# Patient Record
Sex: Female | Born: 1980 | Race: Black or African American | Hispanic: No | Marital: Single | Smoking: Former smoker
Health system: Southern US, Community
[De-identification: ages and names within clinical notes are randomized; demographics above are authoritative.]

## PROBLEM LIST (undated history)

## (undated) ENCOUNTER — Ambulatory Visit

## (undated) ENCOUNTER — Emergency Department (HOSPITAL_COMMUNITY): Admission: EM | Payer: Self-pay | Source: Home / Self Care

## (undated) DIAGNOSIS — J189 Pneumonia, unspecified organism: Secondary | ICD-10-CM

## (undated) DIAGNOSIS — I10 Essential (primary) hypertension: Secondary | ICD-10-CM

## (undated) DIAGNOSIS — K219 Gastro-esophageal reflux disease without esophagitis: Secondary | ICD-10-CM

## (undated) HISTORY — PX: DILATION AND CURETTAGE OF UTERUS: SHX78

## (undated) HISTORY — PX: WISDOM TOOTH EXTRACTION: SHX21

---
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
## 1999-01-20 ENCOUNTER — Ambulatory Visit (HOSPITAL_COMMUNITY): Admission: RE | Admit: 1999-01-20 | Discharge: 1999-01-20 | Payer: Self-pay | Admitting: Obstetrics

## 1999-02-14 ENCOUNTER — Ambulatory Visit (HOSPITAL_COMMUNITY): Admission: RE | Admit: 1999-02-14 | Discharge: 1999-02-14 | Payer: Self-pay | Admitting: *Deleted

## 1999-02-16 ENCOUNTER — Inpatient Hospital Stay (HOSPITAL_COMMUNITY): Admission: AD | Admit: 1999-02-16 | Discharge: 1999-02-16 | Payer: Self-pay | Admitting: Obstetrics & Gynecology

## 1999-02-16 ENCOUNTER — Encounter: Payer: Self-pay | Admitting: Obstetrics & Gynecology

## 1999-02-19 ENCOUNTER — Inpatient Hospital Stay (HOSPITAL_COMMUNITY): Admission: AD | Admit: 1999-02-19 | Discharge: 1999-02-21 | Payer: Self-pay | Admitting: *Deleted

## 2000-04-20 ENCOUNTER — Inpatient Hospital Stay (HOSPITAL_COMMUNITY): Admission: AD | Admit: 2000-04-20 | Discharge: 2000-04-20 | Payer: Self-pay | Admitting: Obstetrics

## 2000-04-22 ENCOUNTER — Inpatient Hospital Stay (HOSPITAL_COMMUNITY): Admission: AD | Admit: 2000-04-22 | Discharge: 2000-04-22 | Payer: Self-pay | Admitting: Obstetrics

## 2000-04-29 ENCOUNTER — Inpatient Hospital Stay (HOSPITAL_COMMUNITY): Admission: AD | Admit: 2000-04-29 | Discharge: 2000-04-29 | Payer: Self-pay | Admitting: Obstetrics & Gynecology

## 2001-01-27 ENCOUNTER — Inpatient Hospital Stay (HOSPITAL_COMMUNITY): Admission: AD | Admit: 2001-01-27 | Discharge: 2001-01-27 | Payer: Self-pay | Admitting: Obstetrics & Gynecology

## 2001-03-03 ENCOUNTER — Ambulatory Visit (HOSPITAL_COMMUNITY): Admission: RE | Admit: 2001-03-03 | Discharge: 2001-03-03 | Payer: Self-pay | Admitting: *Deleted

## 2001-07-12 ENCOUNTER — Inpatient Hospital Stay (HOSPITAL_COMMUNITY): Admission: AD | Admit: 2001-07-12 | Discharge: 2001-07-12 | Payer: Self-pay | Admitting: *Deleted

## 2001-07-13 ENCOUNTER — Inpatient Hospital Stay (HOSPITAL_COMMUNITY): Admission: AD | Admit: 2001-07-13 | Discharge: 2001-07-13 | Payer: Self-pay | Admitting: Obstetrics

## 2001-07-31 ENCOUNTER — Inpatient Hospital Stay (HOSPITAL_COMMUNITY): Admission: AD | Admit: 2001-07-31 | Discharge: 2001-08-02 | Payer: Self-pay | Admitting: Obstetrics & Gynecology

## 2003-06-19 ENCOUNTER — Emergency Department (HOSPITAL_COMMUNITY): Admission: EM | Admit: 2003-06-19 | Discharge: 2003-06-20 | Payer: Self-pay | Admitting: Emergency Medicine

## 2003-06-20 ENCOUNTER — Emergency Department (HOSPITAL_COMMUNITY): Admission: EM | Admit: 2003-06-20 | Discharge: 2003-06-20 | Payer: Self-pay | Admitting: Emergency Medicine

## 2003-06-22 ENCOUNTER — Emergency Department (HOSPITAL_COMMUNITY): Admission: EM | Admit: 2003-06-22 | Discharge: 2003-06-22 | Payer: Self-pay | Admitting: Emergency Medicine

## 2003-09-18 ENCOUNTER — Emergency Department (HOSPITAL_COMMUNITY): Admission: EM | Admit: 2003-09-18 | Discharge: 2003-09-19 | Payer: Self-pay | Admitting: Emergency Medicine

## 2004-01-27 ENCOUNTER — Emergency Department (HOSPITAL_COMMUNITY): Admission: EM | Admit: 2004-01-27 | Discharge: 2004-01-27 | Payer: Self-pay | Admitting: Emergency Medicine

## 2004-01-28 ENCOUNTER — Emergency Department (HOSPITAL_COMMUNITY): Admission: EM | Admit: 2004-01-28 | Discharge: 2004-01-28 | Payer: Self-pay | Admitting: Emergency Medicine

## 2004-01-30 ENCOUNTER — Emergency Department (HOSPITAL_COMMUNITY): Admission: EM | Admit: 2004-01-30 | Discharge: 2004-01-30 | Payer: Self-pay | Admitting: Emergency Medicine

## 2004-06-10 ENCOUNTER — Inpatient Hospital Stay (HOSPITAL_COMMUNITY): Admission: AD | Admit: 2004-06-10 | Discharge: 2004-06-10 | Payer: Self-pay | Admitting: *Deleted

## 2004-08-06 ENCOUNTER — Inpatient Hospital Stay (HOSPITAL_COMMUNITY): Admission: AD | Admit: 2004-08-06 | Discharge: 2004-08-06 | Payer: Self-pay | Admitting: *Deleted

## 2004-09-05 ENCOUNTER — Ambulatory Visit (HOSPITAL_COMMUNITY): Admission: RE | Admit: 2004-09-05 | Discharge: 2004-09-05 | Payer: Self-pay | Admitting: *Deleted

## 2005-01-30 ENCOUNTER — Inpatient Hospital Stay (HOSPITAL_COMMUNITY): Admission: AD | Admit: 2005-01-30 | Discharge: 2005-01-31 | Payer: Self-pay | Admitting: *Deleted

## 2005-01-31 ENCOUNTER — Inpatient Hospital Stay (HOSPITAL_COMMUNITY): Admission: AD | Admit: 2005-01-31 | Discharge: 2005-02-02 | Payer: Self-pay | Admitting: *Deleted

## 2005-01-31 ENCOUNTER — Ambulatory Visit: Payer: Self-pay | Admitting: *Deleted

## 2005-08-07 ENCOUNTER — Emergency Department (HOSPITAL_COMMUNITY): Admission: EM | Admit: 2005-08-07 | Discharge: 2005-08-07 | Payer: Self-pay | Admitting: Emergency Medicine

## 2005-11-21 ENCOUNTER — Encounter: Payer: Self-pay | Admitting: *Deleted

## 2006-03-07 ENCOUNTER — Emergency Department (HOSPITAL_COMMUNITY): Admission: EM | Admit: 2006-03-07 | Discharge: 2006-03-07 | Payer: Self-pay | Admitting: Family Medicine

## 2006-06-15 ENCOUNTER — Emergency Department (HOSPITAL_COMMUNITY): Admission: EM | Admit: 2006-06-15 | Discharge: 2006-06-16 | Payer: Self-pay | Admitting: Emergency Medicine

## 2006-07-31 ENCOUNTER — Emergency Department (HOSPITAL_COMMUNITY): Admission: EM | Admit: 2006-07-31 | Discharge: 2006-07-31 | Payer: Self-pay | Admitting: Family Medicine

## 2006-08-16 ENCOUNTER — Emergency Department (HOSPITAL_COMMUNITY): Admission: EM | Admit: 2006-08-16 | Discharge: 2006-08-16 | Payer: Self-pay | Admitting: Family Medicine

## 2006-11-16 ENCOUNTER — Emergency Department (HOSPITAL_COMMUNITY): Admission: EM | Admit: 2006-11-16 | Discharge: 2006-11-16 | Payer: Self-pay | Admitting: Emergency Medicine

## 2006-12-19 ENCOUNTER — Emergency Department (HOSPITAL_COMMUNITY): Admission: EM | Admit: 2006-12-19 | Discharge: 2006-12-19 | Payer: Self-pay | Admitting: Family Medicine

## 2006-12-26 ENCOUNTER — Telehealth (INDEPENDENT_AMBULATORY_CARE_PROVIDER_SITE_OTHER): Payer: Self-pay | Admitting: *Deleted

## 2007-01-08 ENCOUNTER — Ambulatory Visit: Payer: Self-pay | Admitting: Family Medicine

## 2007-01-08 DIAGNOSIS — K219 Gastro-esophageal reflux disease without esophagitis: Secondary | ICD-10-CM | POA: Insufficient documentation

## 2007-01-17 ENCOUNTER — Encounter (INDEPENDENT_AMBULATORY_CARE_PROVIDER_SITE_OTHER): Payer: Self-pay | Admitting: *Deleted

## 2007-02-19 DIAGNOSIS — J45909 Unspecified asthma, uncomplicated: Secondary | ICD-10-CM | POA: Insufficient documentation

## 2007-04-04 ENCOUNTER — Ambulatory Visit: Payer: Self-pay | Admitting: Gastroenterology

## 2007-04-11 ENCOUNTER — Encounter: Payer: Self-pay | Admitting: Family Medicine

## 2007-04-11 ENCOUNTER — Encounter: Payer: Self-pay | Admitting: Gastroenterology

## 2007-04-11 ENCOUNTER — Ambulatory Visit (HOSPITAL_COMMUNITY): Admission: RE | Admit: 2007-04-11 | Discharge: 2007-04-11 | Payer: Self-pay | Admitting: Gastroenterology

## 2007-04-16 ENCOUNTER — Ambulatory Visit: Payer: Self-pay | Admitting: Gastroenterology

## 2007-04-27 ENCOUNTER — Emergency Department (HOSPITAL_COMMUNITY): Admission: EM | Admit: 2007-04-27 | Discharge: 2007-04-27 | Payer: Self-pay | Admitting: Emergency Medicine

## 2007-05-14 ENCOUNTER — Ambulatory Visit: Payer: Self-pay | Admitting: Gastroenterology

## 2007-05-19 ENCOUNTER — Ambulatory Visit (HOSPITAL_COMMUNITY): Admission: RE | Admit: 2007-05-19 | Discharge: 2007-05-19 | Payer: Self-pay | Admitting: Gastroenterology

## 2007-08-18 ENCOUNTER — Emergency Department (HOSPITAL_COMMUNITY): Admission: EM | Admit: 2007-08-18 | Discharge: 2007-08-19 | Payer: Self-pay | Admitting: Emergency Medicine

## 2007-12-09 ENCOUNTER — Ambulatory Visit: Payer: Self-pay | Admitting: Internal Medicine

## 2007-12-10 ENCOUNTER — Ambulatory Visit (HOSPITAL_COMMUNITY): Admission: RE | Admit: 2007-12-10 | Discharge: 2007-12-10 | Payer: Self-pay | Admitting: Family Medicine

## 2007-12-10 ENCOUNTER — Encounter (INDEPENDENT_AMBULATORY_CARE_PROVIDER_SITE_OTHER): Payer: Self-pay | Admitting: Nurse Practitioner

## 2007-12-10 LAB — CONVERTED CEMR LAB
ALT: 23 units/L (ref 0–35)
AST: 18 units/L (ref 0–37)
Albumin: 4.1 g/dL (ref 3.5–5.2)
Alkaline Phosphatase: 58 units/L (ref 39–117)
BUN: 5 mg/dL — ABNORMAL LOW (ref 6–23)
Basophils Absolute: 0 10*3/uL (ref 0.0–0.1)
Basophils Relative: 1 % (ref 0–1)
CO2: 24 meq/L (ref 19–32)
Calcium: 9.3 mg/dL (ref 8.4–10.5)
Chloride: 109 meq/L (ref 96–112)
Creatinine, Ser: 0.71 mg/dL (ref 0.40–1.20)
Eosinophils Absolute: 0.1 10*3/uL (ref 0.0–0.7)
Eosinophils Relative: 1 % (ref 0–5)
Glucose, Bld: 96 mg/dL (ref 70–99)
HCT: 43.4 % (ref 36.0–46.0)
Hemoglobin: 14.4 g/dL (ref 12.0–15.0)
Lymphocytes Relative: 48 % — ABNORMAL HIGH (ref 12–46)
Lymphs Abs: 2.9 10*3/uL (ref 0.7–4.0)
MCHC: 33.2 g/dL (ref 30.0–36.0)
MCV: 86.6 fL (ref 78.0–100.0)
Monocytes Absolute: 0.5 10*3/uL (ref 0.1–1.0)
Monocytes Relative: 8 % (ref 3–12)
Neutro Abs: 2.6 10*3/uL (ref 1.7–7.7)
Neutrophils Relative %: 43 % (ref 43–77)
Platelets: 259 10*3/uL (ref 150–400)
Potassium: 3.6 meq/L (ref 3.5–5.3)
RBC: 5.01 M/uL (ref 3.87–5.11)
RDW: 13.2 % (ref 11.5–15.5)
Sodium: 142 meq/L (ref 135–145)
TSH: 1.512 microintl units/mL (ref 0.350–5.50)
Total Bilirubin: 0.3 mg/dL (ref 0.3–1.2)
Total Protein: 6.5 g/dL (ref 6.0–8.3)
WBC: 6 10*3/uL (ref 4.0–10.5)

## 2008-01-13 ENCOUNTER — Encounter (INDEPENDENT_AMBULATORY_CARE_PROVIDER_SITE_OTHER): Payer: Self-pay | Admitting: Nurse Practitioner

## 2008-01-13 ENCOUNTER — Ambulatory Visit: Payer: Self-pay | Admitting: Internal Medicine

## 2008-01-13 LAB — CONVERTED CEMR LAB
Cholesterol: 179 mg/dL (ref 0–200)
HDL: 56 mg/dL (ref >39)
LDL Cholesterol: 107 mg/dL — ABNORMAL HIGH (ref 0–99)
Total CHOL/HDL Ratio: 3.2
Triglycerides: 80 mg/dL (ref <150)
VLDL: 16 mg/dL (ref 0–40)

## 2008-03-23 ENCOUNTER — Encounter (INDEPENDENT_AMBULATORY_CARE_PROVIDER_SITE_OTHER): Payer: Self-pay | Admitting: Family Medicine

## 2008-03-23 ENCOUNTER — Ambulatory Visit: Payer: Self-pay | Admitting: Internal Medicine

## 2008-03-23 LAB — CONVERTED CEMR LAB
Free Thyroxine Index: 2.3 (ref 1.0–3.9)
Helicobacter Pylori Antibody-IgG: 0.7
T3 Uptake Ratio: 29.2 % (ref 22.5–37.0)
T4, Total: 8 ug/dL (ref 5.0–12.5)
TSH: 1.577 microintl units/mL (ref 0.350–4.50)

## 2008-05-26 ENCOUNTER — Ambulatory Visit: Payer: Self-pay | Admitting: Internal Medicine

## 2008-06-25 ENCOUNTER — Emergency Department (HOSPITAL_COMMUNITY): Admission: EM | Admit: 2008-06-25 | Discharge: 2008-06-25 | Payer: Self-pay | Admitting: Family Medicine

## 2008-06-28 ENCOUNTER — Emergency Department (HOSPITAL_COMMUNITY): Admission: EM | Admit: 2008-06-28 | Discharge: 2008-06-28 | Payer: Self-pay | Admitting: Family Medicine

## 2008-10-07 ENCOUNTER — Emergency Department (HOSPITAL_COMMUNITY): Admission: EM | Admit: 2008-10-07 | Discharge: 2008-10-07 | Payer: Self-pay | Admitting: Family Medicine

## 2008-10-11 ENCOUNTER — Inpatient Hospital Stay (HOSPITAL_COMMUNITY): Admission: AD | Admit: 2008-10-11 | Discharge: 2008-10-11 | Payer: Self-pay | Admitting: Obstetrics & Gynecology

## 2008-10-11 ENCOUNTER — Encounter: Payer: Self-pay | Admitting: Family Medicine

## 2008-10-23 ENCOUNTER — Ambulatory Visit: Payer: Self-pay | Admitting: Advanced Practice Midwife

## 2008-10-23 ENCOUNTER — Inpatient Hospital Stay (HOSPITAL_COMMUNITY): Admission: AD | Admit: 2008-10-23 | Discharge: 2008-10-23 | Payer: Self-pay | Admitting: Obstetrics and Gynecology

## 2008-11-18 ENCOUNTER — Ambulatory Visit: Payer: Self-pay | Admitting: Family Medicine

## 2008-11-18 ENCOUNTER — Encounter: Payer: Self-pay | Admitting: Family Medicine

## 2008-11-19 ENCOUNTER — Encounter: Payer: Self-pay | Admitting: Family Medicine

## 2008-11-21 ENCOUNTER — Inpatient Hospital Stay (HOSPITAL_COMMUNITY): Admission: AD | Admit: 2008-11-21 | Discharge: 2008-11-21 | Payer: Self-pay | Admitting: Obstetrics & Gynecology

## 2008-12-15 ENCOUNTER — Encounter: Payer: Self-pay | Admitting: Obstetrics and Gynecology

## 2008-12-15 ENCOUNTER — Ambulatory Visit (HOSPITAL_COMMUNITY): Admission: RE | Admit: 2008-12-15 | Discharge: 2008-12-15 | Payer: Self-pay | Admitting: Obstetrics and Gynecology

## 2008-12-15 ENCOUNTER — Ambulatory Visit: Payer: Self-pay | Admitting: Obstetrics and Gynecology

## 2008-12-15 LAB — CONVERTED CEMR LAB
HCT: 36.2 % (ref 36.0–46.0)
Hemoglobin: 12 g/dL (ref 12.0–15.0)
MCHC: 33.1 g/dL (ref 30.0–36.0)
MCV: 86 fL (ref 78.0–100.0)
RBC: 4.21 M/uL (ref 3.87–5.11)
WBC: 6.6 10*3/uL (ref 4.0–10.5)

## 2008-12-16 ENCOUNTER — Ambulatory Visit: Payer: Self-pay | Admitting: Obstetrics & Gynecology

## 2008-12-17 ENCOUNTER — Encounter: Payer: Self-pay | Admitting: Obstetrics & Gynecology

## 2008-12-17 ENCOUNTER — Ambulatory Visit (HOSPITAL_COMMUNITY): Admission: RE | Admit: 2008-12-17 | Discharge: 2008-12-17 | Payer: Self-pay | Admitting: Obstetrics & Gynecology

## 2008-12-17 ENCOUNTER — Ambulatory Visit: Payer: Self-pay | Admitting: Obstetrics & Gynecology

## 2009-01-14 ENCOUNTER — Ambulatory Visit: Payer: Self-pay | Admitting: Obstetrics & Gynecology

## 2009-01-14 LAB — CONVERTED CEMR LAB

## 2009-02-10 ENCOUNTER — Ambulatory Visit: Payer: Self-pay | Admitting: Obstetrics and Gynecology

## 2009-03-02 ENCOUNTER — Ambulatory Visit: Payer: Self-pay | Admitting: Family Medicine

## 2009-03-02 ENCOUNTER — Telehealth: Payer: Self-pay | Admitting: *Deleted

## 2009-03-02 ENCOUNTER — Encounter: Payer: Self-pay | Admitting: Family Medicine

## 2009-03-02 LAB — CONVERTED CEMR LAB: hCG, Beta Chain, Quant, S: 54080.6 milliintl units/mL

## 2009-03-04 ENCOUNTER — Ambulatory Visit (HOSPITAL_COMMUNITY): Admission: RE | Admit: 2009-03-04 | Discharge: 2009-03-04 | Payer: Self-pay | Admitting: Family Medicine

## 2009-03-04 ENCOUNTER — Ambulatory Visit: Payer: Self-pay | Admitting: Family Medicine

## 2009-03-04 ENCOUNTER — Encounter: Payer: Self-pay | Admitting: Family Medicine

## 2009-03-06 ENCOUNTER — Ambulatory Visit: Payer: Self-pay | Admitting: Family

## 2009-03-06 ENCOUNTER — Inpatient Hospital Stay (HOSPITAL_COMMUNITY): Admission: AD | Admit: 2009-03-06 | Discharge: 2009-03-06 | Payer: Self-pay | Admitting: Obstetrics & Gynecology

## 2009-03-07 LAB — CONVERTED CEMR LAB
Basophils Relative: 0 % (ref 0–1)
Hemoglobin: 13.6 g/dL (ref 12.0–15.0)
Hepatitis B Surface Ag: NEGATIVE
Lymphocytes Relative: 31 % (ref 12–46)
Monocytes Absolute: 0.5 10*3/uL (ref 0.1–1.0)
Monocytes Relative: 9 % (ref 3–12)
Neutro Abs: 3.4 10*3/uL (ref 1.7–7.7)
Neutrophils Relative %: 59 % (ref 43–77)
RBC: 4.88 M/uL (ref 3.87–5.11)
Rh Type: POSITIVE
WBC: 5.7 10*3/uL (ref 4.0–10.5)

## 2009-03-15 ENCOUNTER — Telehealth: Payer: Self-pay | Admitting: *Deleted

## 2009-03-15 LAB — CONVERTED CEMR LAB
Antibody Screen: NEGATIVE
Basophils Absolute: 0 10*3/uL (ref 0.0–0.1)
Basophils Relative: 0 % (ref 0–1)
Eosinophils Absolute: 0 10*3/uL (ref 0.0–0.7)
Eosinophils Relative: 0 % (ref 0–5)
HCT: 38.3 % (ref 36.0–46.0)
Hemoglobin: 12.9 g/dL (ref 12.0–15.0)
Hepatitis B Surface Ag: NEGATIVE
Lymphocytes Relative: 25 % (ref 12–46)
Lymphs Abs: 1.7 10*3/uL (ref 0.7–4.0)
MCHC: 33.7 g/dL (ref 30.0–36.0)
MCV: 85.9 fL (ref 78.0–100.0)
Monocytes Absolute: 0.5 10*3/uL (ref 0.1–1.0)
Monocytes Relative: 8 % (ref 3–12)
Neutro Abs: 4.6 10*3/uL (ref 1.7–7.7)
Neutrophils Relative %: 67 % (ref 43–77)
Platelets: 259 10*3/uL (ref 150–400)
RBC: 4.46 M/uL (ref 3.87–5.11)
RDW: 13 % (ref 11.5–15.5)
Rh Type: POSITIVE
Rubella: 256 intl units/mL — ABNORMAL HIGH
Sickle Cell Screen: NEGATIVE
WBC: 6.8 10*3/uL (ref 4.0–10.5)

## 2009-03-18 ENCOUNTER — Encounter: Payer: Self-pay | Admitting: Family Medicine

## 2009-03-18 ENCOUNTER — Other Ambulatory Visit: Admission: RE | Admit: 2009-03-18 | Discharge: 2009-03-18 | Payer: Self-pay | Admitting: Family Medicine

## 2009-03-18 ENCOUNTER — Ambulatory Visit: Payer: Self-pay | Admitting: Family Medicine

## 2009-03-18 LAB — CONVERTED CEMR LAB

## 2009-03-21 ENCOUNTER — Inpatient Hospital Stay (HOSPITAL_COMMUNITY): Admission: AD | Admit: 2009-03-21 | Discharge: 2009-03-21 | Payer: Self-pay | Admitting: Obstetrics & Gynecology

## 2009-03-23 LAB — CONVERTED CEMR LAB: GC Probe Amp, Genital: NEGATIVE

## 2009-04-02 ENCOUNTER — Encounter (INDEPENDENT_AMBULATORY_CARE_PROVIDER_SITE_OTHER): Payer: Self-pay | Admitting: *Deleted

## 2009-04-02 DIAGNOSIS — F17200 Nicotine dependence, unspecified, uncomplicated: Secondary | ICD-10-CM

## 2009-04-18 ENCOUNTER — Ambulatory Visit: Payer: Self-pay | Admitting: Family Medicine

## 2009-05-13 ENCOUNTER — Inpatient Hospital Stay (HOSPITAL_COMMUNITY): Admission: AD | Admit: 2009-05-13 | Discharge: 2009-05-13 | Payer: Self-pay | Admitting: Obstetrics & Gynecology

## 2009-05-24 ENCOUNTER — Ambulatory Visit: Payer: Self-pay | Admitting: Family Medicine

## 2009-05-24 ENCOUNTER — Encounter: Payer: Self-pay | Admitting: Family Medicine

## 2009-05-25 ENCOUNTER — Encounter: Payer: Self-pay | Admitting: *Deleted

## 2009-05-27 ENCOUNTER — Encounter: Payer: Self-pay | Admitting: Family Medicine

## 2009-05-31 ENCOUNTER — Encounter: Payer: Self-pay | Admitting: Family Medicine

## 2009-05-31 ENCOUNTER — Ambulatory Visit (HOSPITAL_COMMUNITY): Admission: RE | Admit: 2009-05-31 | Discharge: 2009-05-31 | Payer: Self-pay | Admitting: Family Medicine

## 2009-06-24 ENCOUNTER — Ambulatory Visit: Payer: Self-pay | Admitting: Family Medicine

## 2009-06-27 ENCOUNTER — Encounter: Payer: Self-pay | Admitting: *Deleted

## 2009-06-27 ENCOUNTER — Telehealth: Payer: Self-pay | Admitting: *Deleted

## 2009-06-28 ENCOUNTER — Encounter: Payer: Self-pay | Admitting: Family Medicine

## 2009-06-28 ENCOUNTER — Ambulatory Visit (HOSPITAL_COMMUNITY): Admission: RE | Admit: 2009-06-28 | Discharge: 2009-06-28 | Payer: Self-pay | Admitting: Family Medicine

## 2009-07-01 ENCOUNTER — Telehealth: Payer: Self-pay | Admitting: Family Medicine

## 2009-07-25 ENCOUNTER — Encounter: Payer: Self-pay | Admitting: Family Medicine

## 2009-07-25 ENCOUNTER — Ambulatory Visit: Payer: Self-pay | Admitting: Family Medicine

## 2009-07-25 LAB — CONVERTED CEMR LAB
HCT: 36.3 % (ref 36.0–46.0)
MCV: 86.8 fL (ref 78.0–100.0)
Platelets: 245 10*3/uL (ref 150–400)
RBC: 4.18 M/uL (ref 3.87–5.11)
WBC: 5.5 10*3/uL (ref 4.0–10.5)

## 2009-07-28 ENCOUNTER — Ambulatory Visit: Payer: Self-pay | Admitting: Family Medicine

## 2009-08-09 ENCOUNTER — Telehealth: Payer: Self-pay | Admitting: Family Medicine

## 2009-08-15 ENCOUNTER — Telehealth: Payer: Self-pay | Admitting: Family Medicine

## 2009-08-16 ENCOUNTER — Encounter: Payer: Self-pay | Admitting: Family Medicine

## 2009-08-17 ENCOUNTER — Ambulatory Visit: Payer: Self-pay | Admitting: Family Medicine

## 2009-08-31 ENCOUNTER — Ambulatory Visit: Payer: Self-pay | Admitting: Family Medicine

## 2009-08-31 ENCOUNTER — Encounter: Payer: Self-pay | Admitting: Family Medicine

## 2009-08-31 LAB — CONVERTED CEMR LAB
ALT: 12 U/L
AST: 17 U/L
Albumin: 3 g/dL — ABNORMAL LOW
Alkaline Phosphatase: 184 U/L — ABNORMAL HIGH
BUN: 6 mg/dL
Bilirubin Urine: NEGATIVE
CO2: 24 meq/L
Calcium: 8.8 mg/dL
Chloride: 104 meq/L
Creatinine, Ser: 0.64 mg/dL
Epithelial cells, urine: >20 /LPF
Glucose, Bld: 71 mg/dL
Glucose, Urine, Semiquant: NEGATIVE
HCT: 41.4 %
Hemoglobin: 13.5 g/dL
INR: 0.91
Ketones, urine, test strip: NEGATIVE
LDH: 222 U/L
MCHC: 32.6 g/dL
MCV: 89.8 fL
Nitrite: NEGATIVE
Platelets: 260 K/uL
Potassium: 4.2 meq/L
Protein, U semiquant: 100
Prothrombin Time: 12.2 s
RBC: 4.61 M/uL
RDW: 14.4 %
Sodium: 138 meq/L
Specific Gravity, Urine: 1.02
Total Bilirubin: 0.3 mg/dL
Total Protein: 5.7 g/dL — ABNORMAL LOW
Uric Acid, Serum: 4.7 mg/dL
Urobilinogen, UA: 0.2
WBC: 5.6 10*3/microliter
aPTT: 30 s
pH: 6.5

## 2009-09-01 ENCOUNTER — Encounter: Payer: Self-pay | Admitting: Family Medicine

## 2009-09-02 ENCOUNTER — Ambulatory Visit: Payer: Self-pay | Admitting: Family Medicine

## 2009-09-02 LAB — CONVERTED CEMR LAB: Glucose, Urine, Semiquant: NEGATIVE

## 2009-09-05 ENCOUNTER — Encounter: Payer: Self-pay | Admitting: Family Medicine

## 2009-09-06 ENCOUNTER — Ambulatory Visit: Payer: Self-pay | Admitting: Obstetrics & Gynecology

## 2009-09-06 ENCOUNTER — Encounter: Payer: Self-pay | Admitting: Family Medicine

## 2009-09-06 LAB — CONVERTED CEMR LAB
Alkaline Phosphatase: 170 units/L — ABNORMAL HIGH (ref 39–117)
BUN: 5 mg/dL — ABNORMAL LOW (ref 6–23)
Glucose, Bld: 105 mg/dL — ABNORMAL HIGH (ref 70–99)
LDH: 195 units/L (ref 94–250)
MCHC: 32.8 g/dL (ref 30.0–36.0)
MCV: 87.3 fL (ref 78.0–100.0)
Platelets: 242 10*3/uL (ref 150–400)
Protein, Ur: 153 mg/24hr — ABNORMAL HIGH (ref 50–100)
Prothrombin Time: 13 s (ref 11.6–15.2)
Sodium: 141 meq/L (ref 135–145)
Total Bilirubin: 0.3 mg/dL (ref 0.3–1.2)
WBC: 5.2 10*3/uL (ref 4.0–10.5)

## 2009-09-09 ENCOUNTER — Encounter: Payer: Self-pay | Admitting: Family Medicine

## 2009-09-09 ENCOUNTER — Ambulatory Visit: Payer: Self-pay | Admitting: Family Medicine

## 2009-09-09 LAB — CONVERTED CEMR LAB
Albumin: 3 g/dL — ABNORMAL LOW (ref 3.5–5.2)
Alkaline Phosphatase: 182 units/L — ABNORMAL HIGH (ref 39–117)
BUN: 6 mg/dL (ref 6–23)
Bilirubin Urine: NEGATIVE
Glucose, Bld: 90 mg/dL (ref 70–99)
Ketones, urine, test strip: NEGATIVE
LDH: 204 units/L (ref 94–250)
MCHC: 33.1 g/dL (ref 30.0–36.0)
MCV: 87.9 fL (ref 78.0–100.0)
Platelets: 225 10*3/uL (ref 150–400)
Potassium: 4 meq/L (ref 3.5–5.3)
Prothrombin Time: 12.7 s (ref 11.6–15.2)
RBC: 4.64 M/uL (ref 3.87–5.11)
Specific Gravity, Urine: >=1.03

## 2009-09-14 ENCOUNTER — Encounter: Payer: Self-pay | Admitting: Family Medicine

## 2009-09-16 ENCOUNTER — Encounter: Payer: Self-pay | Admitting: Family Medicine

## 2009-09-16 ENCOUNTER — Ambulatory Visit: Payer: Self-pay | Admitting: Family Medicine

## 2009-09-16 LAB — CONVERTED CEMR LAB
Blood in Urine, dipstick: NEGATIVE
Glucose, Urine, Semiquant: NEGATIVE
Specific Gravity, Urine: 1.02
WBC Urine, dipstick: NEGATIVE
pH: 6.5

## 2009-09-19 LAB — CONVERTED CEMR LAB
Albumin: 3 g/dL — ABNORMAL LOW (ref 3.5–5.2)
BUN: 5 mg/dL — ABNORMAL LOW (ref 6–23)
CO2: 22 meq/L (ref 19–32)
Calcium: 8.5 mg/dL (ref 8.4–10.5)
Chloride: 106 meq/L (ref 96–112)
Glucose, Bld: 66 mg/dL — ABNORMAL LOW (ref 70–99)
HCT: 38.7 % (ref 36.0–46.0)
Hemoglobin: 12.5 g/dL (ref 12.0–15.0)
LDH: 207 units/L (ref 94–250)
MCHC: 32.3 g/dL (ref 30.0–36.0)
MCV: 87.8 fL (ref 78.0–100.0)
Potassium: 4.1 meq/L (ref 3.5–5.3)
RBC: 4.41 M/uL (ref 3.87–5.11)
Total Protein: 5.6 g/dL — ABNORMAL LOW (ref 6.0–8.3)

## 2009-09-22 ENCOUNTER — Ambulatory Visit: Payer: Self-pay | Admitting: Family Medicine

## 2009-09-22 LAB — CONVERTED CEMR LAB
Glucose, Urine, Semiquant: NEGATIVE
Nitrite: NEGATIVE
Protein, U semiquant: 30
Specific Gravity, Urine: >=1.03
WBC Urine, dipstick: NEGATIVE
pH: 6

## 2009-09-30 ENCOUNTER — Ambulatory Visit: Payer: Self-pay | Admitting: Family Medicine

## 2009-09-30 ENCOUNTER — Encounter: Payer: Self-pay | Admitting: Family Medicine

## 2009-09-30 LAB — CONVERTED CEMR LAB
Blood in Urine, dipstick: NEGATIVE
Glucose, Urine, Semiquant: NEGATIVE
Ketones, urine, test strip: NEGATIVE
Protein, U semiquant: 100
Specific Gravity, Urine: 1.025
WBC Urine, dipstick: NEGATIVE
pH: 6.5

## 2009-10-01 ENCOUNTER — Encounter: Payer: Self-pay | Admitting: Family Medicine

## 2009-10-01 LAB — CONVERTED CEMR LAB: GC Probe Amp, Genital: NEGATIVE

## 2009-10-06 ENCOUNTER — Ambulatory Visit: Payer: Self-pay | Admitting: Family Medicine

## 2009-10-06 ENCOUNTER — Inpatient Hospital Stay (HOSPITAL_COMMUNITY): Admission: AD | Admit: 2009-10-06 | Discharge: 2009-10-09 | Payer: Self-pay | Admitting: Obstetrics & Gynecology

## 2009-10-06 ENCOUNTER — Ambulatory Visit: Payer: Self-pay | Admitting: Obstetrics & Gynecology

## 2009-10-06 LAB — CONVERTED CEMR LAB
Bilirubin Urine: NEGATIVE
Blood in Urine, dipstick: NEGATIVE
Glucose, Urine, Semiquant: NEGATIVE
Ketones, urine, test strip: NEGATIVE
Protein, U semiquant: 100
Urobilinogen, UA: 1

## 2009-10-07 ENCOUNTER — Encounter: Payer: Self-pay | Admitting: Obstetrics & Gynecology

## 2009-10-11 ENCOUNTER — Telehealth: Payer: Self-pay | Admitting: Family Medicine

## 2009-10-13 ENCOUNTER — Ambulatory Visit: Payer: Self-pay | Admitting: Family Medicine

## 2009-10-17 ENCOUNTER — Telehealth: Payer: Self-pay | Admitting: Family Medicine

## 2009-11-15 ENCOUNTER — Ambulatory Visit: Payer: Self-pay | Admitting: Family Medicine

## 2010-01-13 ENCOUNTER — Ambulatory Visit: Payer: Self-pay | Admitting: Family Medicine

## 2010-03-14 ENCOUNTER — Emergency Department (HOSPITAL_COMMUNITY): Admission: EM | Admit: 2010-03-14 | Discharge: 2010-03-14 | Payer: Self-pay | Admitting: Emergency Medicine

## 2010-03-21 ENCOUNTER — Encounter: Payer: Self-pay | Admitting: Family Medicine

## 2010-04-06 ENCOUNTER — Encounter: Payer: Self-pay | Admitting: Family Medicine

## 2010-04-06 ENCOUNTER — Ambulatory Visit: Payer: Self-pay | Admitting: Family Medicine

## 2010-06-29 ENCOUNTER — Ambulatory Visit: Payer: Self-pay | Admitting: Family Medicine

## 2010-07-07 ENCOUNTER — Ambulatory Visit: Payer: Self-pay

## 2010-08-15 ENCOUNTER — Ambulatory Visit
Admission: RE | Admit: 2010-08-15 | Discharge: 2010-08-15 | Payer: Self-pay | Source: Home / Self Care | Attending: Family Medicine | Admitting: Family Medicine

## 2010-08-15 DIAGNOSIS — I8000 Phlebitis and thrombophlebitis of superficial vessels of unspecified lower extremity: Secondary | ICD-10-CM | POA: Insufficient documentation

## 2010-08-24 NOTE — Assessment & Plan Note (Signed)
Summary: ob visit/eo   Vital Signs:  Patient profile:   30 year old female Weight:      202.19 pounds BP sitting:   138 / 82  (right arm)  Vitals Entered By: Terese Door (September 30, 2009 11:41 AM)  Habits & Providers  Alcohol-Tobacco-Diet     Cigarette Packs/Day: n/a  Allergies: 1)  ! Vicodin   Impression & Recommendations:  Problem # 1:  PREGNANCY, NORMAL (ICD-V22.2) Assessment Unchanged Orders: Grp B Probe-[facility] (40347-42595) GC/Chlamydia-[facility] (87591/87491) Medicaid OB visit - [facility] ([id number])  G6 P3023P @ 35weeks 3 days EGA by LMP (w/ concordant u/s @ [redacted]w[redacted]d). Due date 10/24/09   - ***SEE BELOW REGARDING PRIOR PIH / PROTEINURIA WORKUP***  - GT/CT wnl x 2, Pap wnl  - Quad screen:  Borderline elevated for 18.1 GA on NTD screen, recommendation of diagnostic ultrasound. Diagnostic ultrasound wnl.  Screen negative Down Syndrome  Screen negative trisomy 36 - h/o 2 spontaneous abortions  - follow up in 1 week - continue PNV - 1 hr GTT normal - single episode of bleeding at  ~ 7 weeks - small subchorionic hemorrhage noted on ultrasound, w/ viable fetus. Bleeding now resolved. Will follow closely. No previa on recent ultrasound on 05/13/09.   - Check GBS, GC/CT in one week   PRENATAL LABS:  HIV: negative x 2 RPR: negative x2  Hgb: 13.6  Platelets: 276 Sickle cell screen: negative  ABO / Rh: O pos; ab negative GC / Chlamydia:  neg x 2 1 hr GTT: 92 GBS: pending at 36 weeks  Rubella: immune  HepBsAg: negative Urine Cx: insignificant growth   Borderline elevated for 18.1 GA on NTD screen, diagnostic u/s pending  Screen negative Down Syndrome Screen negative trisomy 18  Orders: Medicaid OB visit - [facility] ([id number])  Problem # 2:  PROTEINURIA, MILD (ICD-791.0) Assessment: Deteriorated  Orders: Urinalysis-[facility] (00000) Medicaid OB visit - [facility] ([id number])  Pressures deteriorated. LE edema deteriorated. 100 protein on UA. Will have patient come in for 24 hour urine on Monday. Last two  24 hr urine proteins 200, 153. PIH labs x 4 normal.  BPP/NST denied x 2 because no pre-eclampsia. Will hold off on these for now but if worsening symptoms will send to [facility] clinic at [facility]. Follow up weekly.   Complete Medication List: 1)  Zegerid 40-1100 Mg Caps (Omeprazole-sodium bicarbonate) .Marland Kitchen.. 1 by mouth once daily 2)  Prenatal Vitamins 0.8 Mg Tabs (Prenatal multivit-min-fe-fa) .... [facility] brand  Patient Instructions: 1)   Please go to the MAU if you have more than 4 contractions in an hour, if you have bleeding, a gush of fluid or if the baby isn't moving well. 2)  Check to see how the baby is moving every morning and night.  If she moves at least 5 times in an hour, she is fine.  If less than 5 times in an hour, count for  a second hour.  If less than 10 movements in those 2 hours, go to the MAU to get checked out. 3)  If you start having vision changes, severe headaches, asevere abdominal pains or other concerns, go to [facility] hospital 4)  Please see Dr. 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     Number of children at home:  3  FOB Information    Husband/Father of baby: Francesco Runner     FOB occupation Works at [employer]   Menstrual History    LMP (date): 01/17/2009    LMP - Character: normal     Flowsheet View for Follow-up Visit    Estimated weeks of       gestation:     36 4/7    Weight:     202.19    Blood pressure:   138 / 82    Urine Protein:     100    Urine Glucose:   negative    Urine Nitrite:     negative    Headache:     No    Nausea/vomiting:   No    Edema:     2+LE    Vaginal bleeding:   no    Vaginal discharge:   no    Fundal height:      35    FHR:       150    Fetal activity:     yes    Labor symptoms:   Braxton Hicks ctx increasing    Taking prenatal vits?   Y     Smoking:     n/a    Next visit:     1 wk    Resident:     Wallene Huh    Preceptor:     Breen   Laboratory Results   Urine Tests  Date/Time Received: September 30, 2009 12:35 PM  Date/Time Reported: September 30, 2009 2:45 PM   Routine Urinalysis   Color: yellow Appearance: Clear Glucose: negative   (Normal Range: Negative) Bilirubin: negative   (Normal Range: Negative) Ketone: negative   (Normal Range: Negative) Spec. Gravity: 1.025   (Normal Range: 1.003-1.035) Blood: negative   (Normal Range: Negative) pH: 6.5   (Normal Range: 5.0-8.0) Protein: 100   (Normal Range: Negative) Urobilinogen: 0.2   (Normal Range: 0-1) Nitrite: negative   (Normal Range: Negative) Leukocyte Esterace: negative   (Normal Range: Negative)  Urine Microscopic WBC/HPF: 0-5 RBC/HPF: occ Bacteria/HPF: 2+ Mucous/HPF: 2+ Epithelial/HPF: 5-15    Comments: ...............test performed by......Marland KitchenBonnie A. Swaziland, MLS (ASCP)cm

## 2010-08-24 NOTE — Progress Notes (Signed)
Summary: triage  Phone Note Call from Patient Call back at Home Phone [phone]   Caller: Patient Summary of Call: Pt has swelling in feet and hands and tingling. Initial call taken by: Clydell Hakim,  August 15, 2009 3:34 PM  Follow-up for Phone Call        30 weeks today. explained extra blood volume in body when pregnant. can get swelling & tingling from pressure on nerves. has OB appt Wed. no high bps noted. denies any other symptoms. will discuss at appt.  also needs that note for her clinicals. has to say she can lift & bend Follow-up by: Golden Circle RN,  August 15, 2009 3:41 PM  Additional Follow-up for Phone Call Additional follow up Details #1::        Letter written, will provide info on lifting technique.  Additional Follow-up by: Bobby Rumpf  MD,  August 16, 2009 9:04 AM     Appended Document: triage called and left message to pick up

## 2010-08-24 NOTE — Assessment & Plan Note (Signed)
Summary: depo,df  Nurse Visit   Allergies: 1)  ! Vicodin  Medication Administration  Injection # 1:    Medication: Depo-Provera 150mg     Diagnosis: CONTRACEPTIVE MANAGEMENT (ICD-V25.09)    Route: IM    Site: RUOQ gluteus    Exp Date: 09/2012    Lot #: [id number]    Mfr: [email or web address]    Comments: next Depo due Dec 1 thru Dec15, 2011     Patient tolerated injection without complications    Given by: Theresia Lo RN (April 06, 2010 10:21 AM)  Orders Added: 1)  Depo-Provera 150mg  [J1055] 2)  Admin of Injection (IM/SQ) [04540]   Medication Administration  Injection # 1:    Medication: Depo-Provera 150mg     Diagnosis: CONTRACEPTIVE MANAGEMENT (ICD-V25.09)    Route: IM    Site: RUOQ gluteus    Exp Date: 09/2012    Lot #: [id number]    Mfr: [email or web address]    Comments: next Depo due Dec 1 thru Dec15, 2011     Patient tolerated injection without complications    Given by: Theresia Lo RN (April 06, 2010 10:21 AM)  Orders Added: 1)  Depo-Provera 150mg  [J1055] 2)  Admin of Injection (IM/SQ) [47829]

## 2010-08-24 NOTE — Assessment & Plan Note (Signed)
Summary: std ck & HIV ck,tcb  ***Patient decided to wait on STD check and only recieve depo today...............................................Marland KitchenGaren Grams LPN January 13, 2010 9:12 AM***   Vital Signs:  Patient profile:   29 year old female Height:      67 inches Weight:      195.4 pounds BMI:     30.71 Temp:     97.9 degrees F oral Pulse rate:   73 / minute BP sitting:   131 / 86  (left arm) Cuff size:   regular  Vitals Entered By: Garen Grams LPN (January 13, 2010 9:08 AM)   Allergies: 1)  ! Vicodin   Complete Medication List: 1)  Zegerid 40-1100 Mg Caps (Omeprazole-sodium bicarbonate) .Marland Kitchen.. 1 by mouth once daily 2)  Prenatal Vitamins 0.8 Mg Tabs (Prenatal multivit-min-fe-fa) .... [facility] brand  Other Orders: Urinalysis-[facility] (00000) Depo-Provera 150mg  (J1055) Est Level 1- [facility] (81191)   Medication Administration  Injection # 1:    Medication: Depo-Provera 150mg     Diagnosis: CONTRACEPTIVE MANAGEMENT (ICD-V25.09)    Route: IM    Site: RUOQ gluteus    Exp Date: 08/23/2012    Lot #: [id number]    Mfr: Francisca December    Comments: Next Depo Due: September 9 - Septmeber 23    Patient tolerated injection without complications    Given by: Garen Grams LPN (January 13, 2010 9:09 AM)  Orders Added: 1)  Urinalysis-[facility] [00000] 2)  Depo-Provera 150mg  [J1055] 3)  Est Level 1- [facility] [62130]  Appended Document: Orders Update    Clinical Lists Changes  Orders: Added new Test order of U Preg-[facility] [phone]) - Signed

## 2010-08-24 NOTE — Progress Notes (Signed)
Summary: phn msg  Phone Note Call from Patient Call back at Home Phone [phone]   Caller: Patient Summary of Call: pt is in CMA class and needs a note to say that she can lift and bend, since she is pregnant.  Also, wants to know if she can get a TB test since she is pregnant. pls call after 2pm since she will be in class. Initial call taken by: De Nurse,  August 09, 2009 12:24 PM  Follow-up for Phone Call        no one has called her yet and needs to know something ASAP Follow-up by: De Nurse,  August 11, 2009 2:18 PM  Additional Follow-up for Phone Call Additional follow up Details #1::        left message will tell her tb test is ok. 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

## 2010-08-24 NOTE — Assessment & Plan Note (Signed)
Summary: pain under arm,df   Vital Signs:  Patient profile:   30 year old female Height:      67 inches Weight:      193.7 pounds BMI:     30.45 Temp:     98.3 degrees F oral Pulse rate:   76 / minute BP sitting:   127 / 78  (left arm) Cuff size:   regular  Vitals Entered By: Jimmy Footman, CMA (August 15, 2010 9:09 AM) CC: right side knot by breast x3 weeks Is Patient Diabetic? No   Primary Care Provider:  Bobby Rumpf  MD  CC:  right side knot by breast x3 weeks.  History of Present Illness: Patient describes discomfort and a palpable long mass under her right breast, she thinks it is getting longer.  She felt it in December and noticed that it feels longer this week.  It is not painful when she moves.  She is one year post partum.  Habits & Providers  Alcohol-Tobacco-Diet     Tobacco Status: current  Allergies: 1)  ! Vicodin  Physical Exam  General:  Well-developed,well-nourished,in no acute distress; alert,appropriate and cooperative throughout examination Chest Wall:  palpable cord, 3 inches in length, firm and tender. Breasts:  No mass, nodules, thickening, tenderness, bulging, retraction, inflamation, nipple discharge or skin changes noted.     Impression & Recommendations:  Problem # 1:  SUPERFICIAL PHLEBITIS (ICD-451.0) Odd area for superficial venous cord, treat with hot compressed and NSAIDS, reviewed wtih Dr. Swaziland , consider ultrasound if persists or worsens, recheck in 2 weeks. Orders: [facility]- Est Level  3 (16109)  Complete Medication List: 1)  Depo-provera 150 Mg/ml Susp (Medroxyprogesterone acetate) .Marland KitchenMarland KitchenMarland Kitchen 150 mg im q 3 months 2)  Diclofenac Sodium 75 Mg Tbec (Diclofenac sodium) .... One two times a day for 2 weeks  Patient Instructions: 1)  2 week follow up with Wallene Huh or Sammy Cassar 2)  This appears to be an inflammed vericose vein in your chest wall, it should not cause you any harm 3)  Plan is 2 weeks of anti-inflammatory meds and warm compresses 4)  If  it would suddenly get worse or more painful return sooner 5)  Otherwise return in 2 weeks to see Wallene Huh or Meghana Tullo Prescriptions: DICLOFENAC SODIUM 75 MG TBEC (DICLOFENAC SODIUM) one two times a day for 2 weeks  #28 x 0   Entered and Authorized by:   Luretha Murphy NP   Signed by:   Luretha Murphy NP on 08/15/2010   Method used:   Electronically to        [facility] [phone]* (retail)       [address] [place]  [address]       Ph: [phone] or [phone]       Fax: [phone]   RxID:   [id number]    Orders Added: 1)  [facility]- Est Level  3 [03474]

## 2010-08-24 NOTE — Letter (Signed)
Summary: Generic Letter  [facility]  [address]   [place], [place] [address]   Phone: [phone]  Fax: [phone]    08/16/2009  [facility] [address] [place], [place]  [address]  To whom it may concern:  Ms. Ruth [name] has been a patient of mine since 03/18/09, and I am currently managing her pregnancy. At this point in her pregnancy, Ms. Ruth [name] is able to lift and bend without issue, provided that the proper body mechanics are utilized. I will provide Ms. Ruth [name] a copy of information regarding proper lifting technique and mechanics along with this letter. Patient may continue to lift during pregnancy at her discretion. Please feel free to direct any questions regarding this issue to the address or phone number above.            Sincerely,   Bobby Rumpf  MD   Appended Document: Generic Letter left message for her to call back. does she want the letter today or pick it up at her ob visit tomorrow?

## 2010-08-24 NOTE — Assessment & Plan Note (Signed)
Summary: ob visit 27 weeks/eo   Vital Signs:  Patient profile:   30 year old female Weight:      189 pounds BP sitting:   119 / 64  Vitals Entered By: Jone Baseman CMA (July 25, 2009 9:16 AM)  Allergies: 1)  ! Vicodin   Impression & Recommendations:  Problem # 1:  PREGNANCY, NORMAL (ICD-V22.2) Assessment Unchanged  Orders: Glucose 1 hr-[facility] (82950) CBC-[facility] (16109) HIV-[facility] (60454-09811) RPR-[facility] (91478-29562) Medicaid OB visit - [facility] (99213)  G6 P3023P @ 27 weeks 0 days EGA by LMP (w/ concordant u/s @ [redacted]w[redacted]d). Due date 10/24/09   - repeat HIV, RPR, CBC now - GT/CT, Pap wnl  - Quad screen:  Borderline elevated for 18.1 GA on NTD screen, recommendation of diagnostic ultrasound. Diagnostic ultrasound wnl.  Screen negative Down Syndrome  Screen negative trisomy 67 - h/o 2 spontaneous abortions  - follow up in 3 weeks with Dr. Wallene Huh, f/u in [facility] clinic as scheduled  - continue PNV - no indication for early 1 hr GTT (not obese, no stillbirths, no 1st degree relative w/ DM, no macrosomia); 1 hr GTT today - single episode of bleeding at  ~ 7 weeks - small subchorionic hemorrhage noted on ultrasound, w/ viable fetus. Bleeding now resolved. Will follow closely. No previa on recent ultrasound on 05/13/09.  - discussed importance of smoking cessation and avoiding tobacco exposure - patient denies either though smells of cigarette smoke. will follow closely - advise at each visit.   PRENATAL LABS:  HIV: negative RPR: negative Hgb: 13.6  Platelets: 276 Sickle cell screen: negative  ABO / Rh: O pos; ab negative GC / Chlamydia:  neg / neg 1 hr GTT: pending at 28 weeks  GBS: pending at 36 weeks  Rubella: immune  HepBsAg: negative Urine Cx: insignificant growth   Borderline elevated for 18.1 GA on NTD screen, diagnostic u/s pending  Screen negative Down Syndrome Screen negative trisomy 18  Complete Medication List: 1)  Zegerid 40-1100 Mg Caps (Omeprazole-sodium bicarbonate) .Marland Kitchen..  1 by mouth once daily 2)  Prenatal Vitamins 0.8 Mg Tabs (Prenatal multivit-min-fe-fa) .... [facility] brand  Patient Instructions: 1)  It was great to meet you today! 2)  I look forward to taking care of you and your baby! 3)  You are at 27 weeks and 0 days today. 4)  Your due date is 10/24/09.  5)  Follow up with me in 3 weeks 6)  Eat 4-5 smaller meals each day to help with feeling full and with nausea.  7)  Healthy weight gain would be 25-30 lbs over the course of your pregnancy. Exercise is safe during pregnancy - light walking should be fine for you to do. We will continue to follow your weight. 8)  If you have any bleeding or contractions go to [facility].     [facility] for Follow-up Visit    Estimated weeks of       gestation:     27 0/7    Weight:     189    Blood pressure:   119 / 64    Nausea/vomiting:   No    Edema:     0    Vaginal bleeding:   no    Vaginal discharge:   no    Fetal activity:     yes    Labor symptoms:   no   Appended Document: ob visit 27 weeks/eo Fundal height = 28 cm FHT 140-150

## 2010-08-24 NOTE — Assessment & Plan Note (Signed)
Summary: DEPO & flu shot/[facility]  Nurse Visit   Allergies: 1)  ! Vicodin  Immunizations Administered:  Influenza Vaccine # 1:    Vaccine Type: Fluvax 3+    Site: right deltoid    Mfr: GlaxoSmithKline    Dose: 0.5 ml    Route: IM    Given by: Theresia Lo RN    Exp. Date: 01/20/2011    Lot #: [id number]    VIS given: 02/14/10 version given June 29, 2010.  Medication Administration  Injection # 1:    Medication: Depo-Provera 150mg     Diagnosis: CONTRACEPTIVE MANAGEMENT (ICD-V25.09)    Route: IM    Site: RUOQ gluteus    Exp Date: 11/2012    Lot #: [id number]    Mfr: [email or web address]    Comments: next depo due Feb 23 thru September 29, 2010    Patient tolerated injection without complications    Given by: Theresia Lo RN (June 29, 2010 1:52 PM)  Orders Added: 1)  Depo-Provera 150mg  [J1055] 2)  Flu Vaccine 49yrs + [90658] 3)  Admin 1st Vaccine [90471] 4)  Admin of Injection (IM/SQ) [14782]   Medication Administration  Injection # 1:    Medication: Depo-Provera 150mg     Diagnosis: CONTRACEPTIVE MANAGEMENT (ICD-V25.09)    Route: IM    Site: RUOQ gluteus    Exp Date: 11/2012    Lot #: [id number]    Mfr: [email or web address]    Comments: next depo due Feb 23 thru September 29, 2010    Patient tolerated injection without complications    Given by: Theresia Lo RN (June 29, 2010 1:52 PM)  Orders Added: 1)  Depo-Provera 150mg  [J1055] 2)  Flu Vaccine 52yrs + [90658] 3)  Admin 1st Vaccine [90471] 4)  Admin of Injection (IM/SQ) [08657]

## 2010-08-24 NOTE — Assessment & Plan Note (Signed)
Summary: ob visit/eo   Vital Signs:  Patient profile:   30 year old female Weight:      188.3 pounds Temp:     98.2 degrees F oral Pulse rate:   79 / minute BP sitting:   125 / 78  (left arm) Cuff size:   regular  Vitals Entered By: Gladstone Pih (July 28, 2009 8:46 AM) CC: OB 27 3 Is Patient Diabetic? No Pain Assessment Patient in pain? no        CC:  OB 27 3.  Habits & Providers  Alcohol-Tobacco-Diet     Cigarette Packs/Day: n/a  Allergies: 1)  ! Vicodin   Impression & Recommendations:  Problem # 1:  PREGNANCY, NORMAL (ICD-V22.2) Doing well.  28 week labs already done.  Flu shot today. Reviewed PTL precautions and kick counts.  Pt considering Depo for contraception.  Has done well in the past with this.  Complete Medication List: 1)  Zegerid 40-1100 Mg Caps (Omeprazole-sodium bicarbonate) .Marland Kitchen.. 1 by mouth once daily 2)  Prenatal Vitamins 0.8 Mg Tabs (Prenatal multivit-min-fe-fa) .... [facility] brand  Other Orders: Influenza Vaccine NON MCR (96045)  Patient Instructions: 1)  Please go to the MAU if you have more than 4 contractions in an hour, if you have bleeding, a gush of fluid or if the baby isn't moving well. 2)  Check to see how the baby is moving every morning and night.  If she moves at least 5 times in an hour, she is fine.  If less than 5 times in an hour, count for  a second hour.  If less than 10 movements in those 2 hours, go to the MAU to get checked out. 3)  Please see Dr. Wallene Huh in 2 weeks.    Flowsheet View for Follow-up Visit    Estimated weeks of       gestation:     27 3/7    Weight:     188.3    Blood pressure:   125 / 78    Headache:     No    Nausea/vomiting:   No    Edema:     0    Vaginal bleeding:   no    Vaginal discharge:   no    Fundal height:      25    FHR:       150s    Fetal activity:     yes    Labor symptoms:   no    Fetal position:     ??    Taking prenatal vits?   Y    Smoking:     n/a    Next visit:     2  wk     Immunizations Administered:  Influenza Vaccine # 1:    Vaccine Type: Fluvax Non-MCR    Site: right deltoid    Mfr: GlaxoSmithKline    Dose: 0.5 ml    Route: IM    Given by: Loralee Pacas CMA    Exp. Date: 01/19/2010    Lot #: [id number]    VIS given: 03/01/2009  Flu Vaccine Consent Questions:    Do you have a history of severe allergic reactions to this vaccine? no    Any prior history of allergic reactions to egg and/or gelatin? no    Do you have a sensitivity to the preservative Thimersol? no    Do you have a past history of Guillan-Barre Syndrome? no    Do you  currently have an acute febrile illness? no    Have you ever had a severe reaction to latex? no    Vaccine information given and explained to patient? yes    Are you currently pregnant? no   Appended Document: ob visit/eo    Clinical Lists Changes  Orders: Added new Test order of Medicaid OB visit - [facility] [phone]) - Signed

## 2010-08-24 NOTE — Assessment & Plan Note (Signed)
Summary: FU/KH   Vital Signs:  Patient profile:   30 year old female Weight:      198 pounds BP sitting:   129 / 87  Serial Vital Signs/Assessments:  Comments: 9:42 AM Manual BP: 128/88 By: Garen Grams LPN    Habits & Providers  Alcohol-Tobacco-Diet     Cigarette Packs/Day: n/a  Allergies: 1)  ! Vicodin   Impression & Recommendations:  Problem # 1:  PREGNANCY, NORMAL (ICD-V22.2) Assessment Unchanged  G6 P3023P @ 33weeks 4 days EGA by LMP (w/ concordant u/s @ [redacted]w[redacted]d). Due date 10/24/09   - ***SEE BELOW REGARDING PIH WORKUP***  - GT/CT, Pap wnl  - Quad screen:  Borderline elevated for 18.1 GA on NTD screen, recommendation of diagnostic ultrasound. Diagnostic ultrasound wnl.  Screen negative Down Syndrome  Screen negative trisomy 67 - h/o 2 spontaneous abortions  - follow up in 1 week - continue PNV - 1 hr GTT normal - single episode of bleeding at  ~ 7 weeks - small subchorionic hemorrhage noted on ultrasound, w/ viable fetus. Bleeding now resolved. Will follow closely. No previa on recent ultrasound on 05/13/09.     PRENATAL LABS:  HIV: negative x 2 RPR: negative x2  Hgb: 13.6  Platelets: 276 Sickle cell screen: negative  ABO / Rh: O pos; ab negative GC / Chlamydia:  neg / neg 1 hr GTT: 92 GBS: pending at 36 weeks  Rubella: immune  HepBsAg: negative Urine Cx: insignificant growth   Borderline elevated for 18.1 GA on NTD screen, diagnostic u/s pending  Screen negative Down Syndrome Screen negative trisomy 18  Orders: Medicaid OB visit - [facility] ([id number])  Problem # 2:  ? of PRE-ECLAMPSIA (ICD-642.40) Assessment: Unchanged Pressures remain improved. Will check PIH panel as below. 30 protein on non clean catch UA. Will hold off on 24 urine protein. LAst two 24 hr urine proteins 200, 153. PIH labs x 2 normal. Will check PIH labs, BPP/NST weekly. Follow up weekly.   Check PIH labs. Follow up in two days.  Orders: CBC-[facility] ([id number]) INR/PT-[facility] (21308) PTT-[facility]  ([id number]) Uric Acid-[facility] ([id number]) LDH-[facility] (10272) Comp Met-[facility] ([id number]) Miscellaneous Lab Charge-[facility] (42595)  Complete Medication List: 1)  Zegerid 40-1100 Mg Caps (Omeprazole-sodium bicarbonate) .Marland Kitchen.. 1 by mouth once daily 2)  Prenatal Vitamins 0.8 Mg Tabs (Prenatal multivit-min-fe-fa) .... [employer] brand  Other Orders: Urinalysis-[facility] (00000)  Patient Instructions: 1)   Please go to the MAU if you have more than 4 contractions in an hour, if you have bleeding, a gush of fluid or if the baby isn't moving well. 2)  Check to see how the baby is moving every morning and night.  If she moves at least 5 times in an hour, she is fine.  If less than 5 times in an hour, count for  a second hour.  If less than 10 movements in those 2 hours, go to the MAU to get checked out. 3)  If you start having vision changes, severe headaches, asevere abdominal pains or other concerns, go to [facility] hospital 4)  Please see Dr. 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     Number of children at home: 3  FOB Information    Husband/Father of baby: Francesco Runner     FOB occupation  Works at [employer]   Menstrual History    LMP (date): 01/17/2009    LMP - Character: normal   Flowsheet View for Follow-up Visit    Estimated weeks of       gestation:     33 4/7    Weight:     198    Blood pressure:   129 / 87    Urine Protein:     30    Urine Glucose:   negative    Urine Nitrite:     negative    Headache:     No; no visual changes    Nausea/vomiting:   No    Edema:     same as last visit    Vaginal bleeding:   no    Vaginal discharge:   no    Fundal height:      32.5    FHR:       148    Fetal activity:     yes    Labor symptoms:   no    Taking prenatal vits?   Y    Smoking:     n/a    Next visit:     1 wk    Resident:      Wallene Huh      Laboratory Results   Urine Tests  Date/Time Received: September 09, 2009 10:15 AM  Date/Time Reported: September 09, 2009 10:26 AM   Routine Urinalysis   Color: yellow Appearance: Clear Glucose: negative   (Normal Range: Negative) Bilirubin: negative   (Normal Range: Negative) Ketone: negative   (Normal Range: Negative) Spec. Gravity: >=1.030   (Normal Range: 1.003-1.035) Blood: negative   (Normal Range: Negative) pH: 6.5   (Normal Range: 5.0-8.0) Protein: 30   (Normal Range: Negative) Urobilinogen: 0.2   (Normal Range: 0-1) Nitrite: negative   (Normal Range: Negative) Leukocyte Esterace: negative   (Normal Range: Negative)  Urine Microscopic WBC/HPF: 0-2 Bacteria/HPF: 1+ Mucous/HPF: 2+ Epithelial/HPF: 10-20    Comments: ...............test performed by......Marland KitchenBonnie A. [place], MLS (ASCP)cm      Appended Document: FU/KH precepted with Dr. Mauricio Po - agrees with plan

## 2010-08-24 NOTE — Progress Notes (Signed)
Summary: FYI  Phone Note Other Incoming   Caller: Birdie Riddle Love Nurse Summary of Call: Pt. BP reading on Friday 134/86 and on Monday was 122/84  ***Pts baby weighed in at 4lbs 6oz on Monday 10/17/09, baby was only 3lbs 12oz at birth*** Initial call taken by: Garen Grams LPN,  October 17, 2009 2:10 PM

## 2010-08-24 NOTE — Assessment & Plan Note (Signed)
Summary: ob visit/eo   Vital Signs:  Patient profile:   30 year old female Weight:      198.4 pounds Pulse rate:   76 / minute BP sitting:   133 / 85  Vitals Entered By: Garen Grams LPN (September 16, 2009 8:39 AM)  Habits & Providers  Alcohol-Tobacco-Diet     Cigarette Packs/Day: n/a  Allergies: 1)  ! Vicodin   Impression & Recommendations:  Problem # 1:  PREGNANCY, NORMAL (ICD-V22.2) Assessment Unchanged  G6 P3023P @ 34weeks 4 days EGA by LMP (w/ concordant u/s @ [redacted]w[redacted]d). Due date 10/24/09   - ***SEE BELOW REGARDING PIH WORKUP***  - GT/CT, Pap wnl  - Quad screen:  Borderline elevated for 18.1 GA on NTD screen, recommendation of diagnostic ultrasound. Diagnostic ultrasound wnl.  Screen negative Down Syndrome  Screen negative trisomy 73 - h/o 2 spontaneous abortions  - follow up in 1 week - continue PNV - 1 hr GTT normal - single episode of bleeding at  ~ 7 weeks - small subchorionic hemorrhage noted on ultrasound, w/ viable fetus. Bleeding now resolved. Will follow closely. No previa on recent ultrasound on 05/13/09.     PRENATAL LABS:  HIV: negative x 2 RPR: negative x2  Hgb: 13.6  Platelets: 276 Sickle cell screen: negative  ABO / Rh: O pos; ab negative GC / Chlamydia:  neg / neg 1 hr GTT: 92 GBS: pending at 36 weeks  Rubella: immune  HepBsAg: negative Urine Cx: insignificant growth   Borderline elevated for 18.1 GA on NTD screen, diagnostic u/s pending  Screen negative Down Syndrome Screen negative trisomy 18  Orders: Medicaid OB visit - [facility] ([id number])  Problem # 2:  ? of PRE-ECLAMPSIA (ICD-642.40) Assessment: Unchanged Pressures as above. Will check PIH panel as below. 30 protein on non clean catch UA. Will hold off on 24 urine protein. Last two 24 hr urine proteins 200, 153. PIH labs x 3 normal. Will check PIH labs. BPP/NST denied x 2 because no pre-eclampsia. Will hold off on these for now but if pressures increase, if proteinuria increases, or worsening  symptoms will send to HR clinic at [facility]. Follow up weekly.  Orders: Comp Met-[facility] [id number]) Urinalysis-[facility] (00000) CBC-[facility] (57846) LDH-[facility] [id number]) Uric Acid-[facility] ([id number]) INR/PT-[facility] (02725) PTT-[facility] (36644-03474) Miscellaneous Lab Charge-[facility] (25956) Medicaid OB visit - [facility] ([id number])    Check PIH labs. Follow up in two days.  Orders: CBC-[facility] (43329) INR/PT-[facility] (51884) PTT-[facility] ([id number]) Uric Acid-[facility] ([id number]) LDH-[facility] (22025) Comp Met-[facility] ([id number]) Miscellaneous Lab Charge-[facility] (83151)  Complete Medication List: 1)  Zegerid 40-1100 Mg Caps (Omeprazole-sodium bicarbonate) .Marland Kitchen.. 1 by mouth once daily 2)  Prenatal Vitamins 0.8 Mg Tabs (Prenatal multivit-min-fe-fa) .... [employer] brand  Laboratory Results   Urine Tests  Date/Time Received: September 16, 2009 8:58 AM  Date/Time Reported: September 16, 2009 9:20 AM   Routine Urinalysis   Color: yellow Appearance: Clear Glucose: negative   (Normal Range: Negative) Bilirubin: negative   (Normal Range: Negative) Ketone: negative   (Normal Range: Negative) Spec. Gravity: 1.020   (Normal Range: 1.003-1.035) Blood: negative   (Normal Range: Negative) pH: 6.5   (Normal Range: 5.0-8.0) Protein: 30   (Normal Range: Negative) Urobilinogen: 0.2   (Normal Range: 0-1) Nitrite: negative   (Normal Range: Negative) Leukocyte Esterace: negative   (Normal Range: Negative)  Urine Microscopic WBC/HPF: rare RBC/HPF: rare Bacteria/HPF: 2+ Mucous/HPF: 2+ Epithelial/HPF: >20    Comments: ...............test performed by......Marland KitchenBonnie A. 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     Number of children at home: 3  FOB Information    Husband/Father of baby: Francesco Runner     FOB occupation Works at [employer]   Menstrual History     LMP (date): 01/17/2009    LMP - Character: normal   Flowsheet View for Follow-up Visit    Estimated weeks of       gestation:     34 4/7    Weight:     198.4    Blood pressure:   133 / 85    Urine Protein:     30    Urine Glucose:   negative    Urine Nitrite:     negative    Headache:     No, visual changes     Nausea/vomiting:   No    Edema:     slight improvement     Vaginal bleeding:   no    Vaginal discharge:   no    Fundal height:      33    FHR:       146-156    Fetal activity:     yes    Labor symptoms:   no    Taking prenatal vits?   Y    Smoking:     n/a    Next visit:     1 wk    Resident:     Wallene Huh     Preceptor:     Mauricio Po

## 2010-08-24 NOTE — Assessment & Plan Note (Signed)
Summary: htn,swelling/[place]/carew   Vital Signs:  Patient profile:   30 year old female Weight:      193.1 pounds Temp:     98.3 degrees F oral Pulse rate:   76 / minute Pulse rhythm:   regular BP sitting:   134 / 86  (left arm) Cuff size:   regular  Vitals Entered By: Loralee Pacas CMA (October 13, 2009 8:43 AM)  Primary Care Provider:  Bobby Rumpf  MD   History of Present Illness: 30 yo F, post partum, pre-eclampsia, with concern for HTN and swelling.  BPs normal, swelling improving, some tingling in fingers but improving.  No HA, no N/V, no visual changes.  No other complaints.  Current Medications (verified): 1)  Zegerid 40-1100 Mg  Caps (Omeprazole-Sodium Bicarbonate) .Marland Kitchen.. 1 By Mouth Once Daily 2)  Prenatal Vitamins 0.8 Mg Tabs (Prenatal Multivit-Min-Fe-Fa) .... [employer] Brand  Allergies (verified): 1)  ! Vicodin  Review of Systems       See HPI  Physical Exam  General:  Well-developed,well-nourished,in no acute distress; alert,appropriate and cooperative throughout examination Lungs:  Normal respiratory effort, chest expands symmetrically. Lungs are clear to auscultation, no crackles or wheezes. Heart:  Normal rate and regular rhythm. S1 and S2 normal without gallop, murmur, click, rub or other extra sounds. Abdomen:  Bowel sounds positive,abdomen soft and non-tender without masses, organomegaly or hernias noted. Extremities:  1+ pitting edema in B/L LE, neg homans sign, pulses normal bilaterally.   Impression & Recommendations:  Problem # 1:  EDEMA (ICD-782.3) Assessment Improved Still diuresing from hospitalization, BPs ok, no antihypertensives needed.  No pre-eclamptic symptoms.  Should see PCP at 6wk PP check.  Orders: Urinalysis-[facility] (00000) [facility]- Est Level  3 (27253)  Complete Medication List: 1)  Zegerid 40-1100 Mg Caps (Omeprazole-sodium bicarbonate) .Marland Kitchen.. 1 by mouth once daily 2)  Prenatal Vitamins 0.8 Mg Tabs (Prenatal multivit-min-fe-fa) .... [facility]  brand  Patient Instructions: 1)  Great to meet you today, 2)  Your swelling will go down and your blood pressures are normal.  You can try some TED hose/compression stockings when up during the day. 3)  Congratulations on your new baby, I know you can't wait until she is out of the NICU! 4)  -Dr. Karie Schwalbe.  Appended Document: UA results  Laboratory Results   Urine Tests  Date/Time Received: October 13, 2009 8:49 AM  Date/Time Reported: October 13, 2009 9:33 AM   Routine Urinalysis   Color: yellow Appearance: Clear Glucose: negative   (Normal Range: Negative) Bilirubin: negative   (Normal Range: Negative) Ketone: negative   (Normal Range: Negative) Spec. Gravity: 1.025   (Normal Range: 1.003-1.035) Blood: large   (Normal Range: Negative) pH: 6.0   (Normal Range: 5.0-8.0) Protein: 100   (Normal Range: Negative) Urobilinogen: 0.2   (Normal Range: 0-1) Nitrite: negative   (Normal Range: Negative) Leukocyte Esterace: negative   (Normal Range: Negative)  Urine Microscopic WBC/HPF: 10-15 RBC/HPF: 0-3 Bacteria/HPF: 2+ Mucous/HPF: 2+ Epithelial/HPF: 5-10    Comments: ...........test performed by...........Marland KitchenTerese Door, CMA

## 2010-08-24 NOTE — Consult Note (Signed)
Summary: Stress Test  Stress Test   Imported By: Clydell Hakim 09/14/2009 16:04:56  _____________________________________________________________________  External Attachment:    Type:   Image     Comment:   External Document

## 2010-08-24 NOTE — Assessment & Plan Note (Signed)
Summary: OB/KH   Vital Signs:  Patient profile:   30 year old female Weight:      191 pounds BP sitting:   127 / 86  Vitals Entered By: Jone Baseman CMA (August 17, 2009 4:10 PM)  Habits & Providers  Alcohol-Tobacco-Diet     Cigarette Packs/Day: n/a  Allergies: 1)  ! Vicodin   Impression & Recommendations:  Problem # 1:  PREGNANCY, NORMAL (ICD-V22.2) Assessment Unchanged  G6 P3023P @ 30 weeks 2 days EGA by LMP (w/ concordant u/s @ [redacted]w[redacted]d). Due date 10/24/09   - GT/CT, Pap wnl  - Quad screen:  Borderline elevated for 18.1 GA on NTD screen, recommendation of diagnostic ultrasound. Diagnostic ultrasound wnl.  Screen negative Down Syndrome  Screen negative trisomy 68 - h/o 2 spontaneous abortions  - follow up in 2 weeks with Dr. Wallene Huh, - continue PNV - 1 hr GTT normal - single episode of bleeding at  ~ 7 weeks - small subchorionic hemorrhage noted on ultrasound, w/ viable fetus. Bleeding now resolved. Will follow closely. No previa on recent ultrasound on 05/13/09.  - does not smell of cigarette smoke today   PRENATAL LABS:  HIV: negative x 2 RPR: negative x2  Hgb: 13.6  Platelets: 276 Sickle cell screen: negative  ABO / Rh: O pos; ab negative GC / Chlamydia:  neg / neg 1 hr GTT: 92 GBS: pending at 36 weeks  Rubella: immune  HepBsAg: negative Urine Cx: insignificant growth   Borderline elevated for 18.1 GA on NTD screen, diagnostic u/s pending  Screen negative Down Syndrome Screen negative trisomy 18  Orders: Medicaid OB visit - [facility] ([id number])  Complete Medication List: 1)  Zegerid 40-1100 Mg Caps (Omeprazole-sodium bicarbonate) .Marland Kitchen.. 1 by mouth once daily 2)  Prenatal Vitamins 0.8 Mg Tabs (Prenatal multivit-min-fe-fa) .... [employer] brand  Patient Instructions: 1)  Please go to the MAU if you have more than 4 contractions in an hour, if you have bleeding, a gush of fluid or if the baby isn't moving well. 2)  Check to see how the baby is moving every morning and  night.  If she moves at least 5 times in an hour, she is fine.  If less than 5 times in an hour, count for  a second hour.  If less than 10 movements in those 2 hours, go to the MAU to get checked out. 3)  Please see Dr. Wallene Huh in 2 weeks.   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     Number of children at home: 3  FOB Information    Husband/Father of baby: Francesco Runner     FOB occupation Works at [employer]   Menstrual History    LMP (date): 01/17/2009    LMP - Character: normal   Flowsheet View for Follow-up Visit    Estimated weeks of       gestation:     30 2/7    Weight:     191    Blood pressure:   127 / 86    Headache:     No    Nausea/vomiting:   No    Edema:     some  swelling in hands and feet at night, no vision change or HA or abdominal pain    Vaginal bleeding:  no    Vaginal discharge:   no    Fundal height:      29    FHR:       155    Fetal activity:     yes    Labor symptoms:   no    Taking prenatal vits?   Y    Smoking:     n/a    Next visit:     2 wks    Resident:     Wallene Huh    Preceptor:     sent to [place]

## 2010-08-24 NOTE — Assessment & Plan Note (Signed)
Summary: ob/kh 37.3 weeks    Vital Signs:  Patient profile:   30 year old female Weight:      198 pounds BP sitting:   143 / 95  Vitals Entered By: Jone Baseman CMA (October 06, 2009 8:42 AM)  Habits & Providers  Alcohol-Tobacco-Diet     Cigarette Packs/Day: n/a  Allergies: 1)  ! Vicodin   Impression & Recommendations:  Problem # 1:  PREGNANCY, NORMAL (ICD-V22.2)  G6 P3023P @ 37weeks 3 days EGA by LMP (w/ concordant u/s @ [redacted]w[redacted]d). Due date 10/24/09   - ***SEE BELOW REGARDING PRIOR PIH / PROTEINURIA WORKUP***  - GT/CT wnl x 2, Pap wnl  - Quad screen:  Borderline elevated for 18.1 GA on NTD screen, recommendation of diagnostic ultrasound. Diagnostic ultrasound wnl.  Screen negative Down Syndrome  Screen negative trisomy 38 - h/o 2 spontaneous abortions  - follow up in 1 week - continue PNV - 1 hr GTT normal - single episode of bleeding at  ~ 7 weeks - small subchorionic hemorrhage noted on ultrasound, w/ viable fetus. Bleeding now resolved. Will follow closely. No previa on recent ultrasound on 05/13/09.    PRENATAL LABS:  HIV: negative x 2 RPR: negative x2  Hgb: 13.6  Platelets: 276 Sickle cell screen: negative  ABO / Rh: O pos; ab negative GC / Chlamydia:  neg x 2 1 hr GTT: 92 GBS: negative  Rubella: immune  HepBsAg: negative Urine Cx: insignificant growth   Borderline elevated for 18.1 GA on NTD screen, diagnostic u/s pending  Screen negative Down Syndrome Screen negative trisomy 18 Breast and Bottle Depo --> Depo   Orders: Medicaid OB visit - [facility] ([id number])  Orders: Medicaid OB visit - [facility] ([id number])  Problem # 2:  PROTEINURIA, MILD (ICD-791.0) Pressures deteriorated. LE edema deteriorated. 100 protein on UA. Headaches with visual changes (lights in vision) over past two days. BPP/NST denied by Medicaid x 2 because no pre-eclampsia. Given change in symptoms, term pregnancy, deteriorated BP, proteinuria, today will send to MAU for PIH work up.   Orders: Urinalysis-[facility] (00000)  Complete Medication List: 1)  Zegerid 40-1100 Mg Caps (Omeprazole-sodium bicarbonate) .Marland Kitchen.. 1 by mouth once daily 2)  Prenatal Vitamins 0.8 Mg Tabs (Prenatal multivit-min-fe-fa) .... [facility] brand   Flowsheet View for Follow-up Visit    Estimated weeks of       gestation:     37 3/7    Weight:     198    Blood pressure:   143 / 95    Urine protein:       100    Urine glucose:    negative    Urine nitrite:     negative    Hx headache?     headache x 2 days w/ lights in vision, nausea - mostly relieved by Tylenol    Nausea/vomiting?   nausea    Edema?     2+ LE    Bleeding?     no    Leakage/discharge?   no    Fetal activity:       yes    Labor symptoms?   ffew braxton hicks    Taking Vitamins?   Y    Smoking PPD:   n/a    Next visit:     1 wk    Resident:     Wallene Huh    Laboratory Results   Urine Tests  Date/Time Received: October 06, 2009 8:45 AM  Date/Time Reported: October 06, 2009 9:09  AM   Routine Urinalysis   Color: yellow Appearance: Clear Glucose: negative   (Normal Range: Negative) Bilirubin: negative   (Normal Range: Negative) Ketone: negative   (Normal Range: Negative) Spec. Gravity: 1.025   (Normal Range: 1.003-1.035) Blood: negative   (Normal Range: Negative) pH: 6.5   (Normal Range: 5.0-8.0) Protein: 100   (Normal Range: Negative) Urobilinogen: 1.0   (Normal Range: 0-1) Nitrite: negative   (Normal Range: Negative) Leukocyte Esterace: negative   (Normal Range: Negative)  Urine Microscopic WBC/HPF: 1-5 RBC/HPF: occ Bacteria/HPF: 2+ Mucous/HPF: 3+ Epithelial/HPF: 10-20    Comments: occ sperm present...........test performed by...........Marland KitchenTerese Door, CMA

## 2010-08-24 NOTE — Assessment & Plan Note (Signed)
Summary: postpartum,tcb   Vital Signs:  Patient profile:   30 year old female Height:      67 inches Weight:      189.1 pounds BMI:     29.72 Temp:     98.6 degrees F oral Pulse rate:   70 / minute BP sitting:   112 / 71  (left arm) Cuff size:   regular  Vitals Entered By: Garen Grams LPN (November 15, 2009 10:31 AM) CC: postpartum check Is Patient Diabetic? No Pain Assessment Patient in pain? no        Primary Care Provider:  Bobby Rumpf  MD  CC:  postpartum check.  History of Present Illness: 1) Postpartum follow up: Admitted for severe pre-eclampsia by fetal size criteria (see dictated d/c summary), progressed to VAVD. (Myone Clark) . Denies pain, reports mild spotting x past two days, denies depressive symptoms, reports that everyone is adjusting well at home. No longer breast feeding, but has frozen breast milk left over which she is mixing with formula per NICU instructions. On Depo Provera (last given 10/09/09), would like to continue. Has not had LE edema since being seen by Dr. Benjamin Stain on 3/24. Has not had other PIH symptoms. Pressures normalized w/o antihypertensives.   2) Prevention: Would like tetatnus booster. Last booster was 10 years ago.   Current Medications (verified): 1)  Zegerid 40-1100 Mg  Caps (Omeprazole-Sodium Bicarbonate) .Marland Kitchen.. 1 By Mouth Once Daily 2)  Prenatal Vitamins 0.8 Mg Tabs (Prenatal Multivit-Min-Fe-Fa) .... [employer]vs Brand  Allergies (verified): 1)  ! Vicodin  Physical Exam  General:  NAD, vitals reviewed. Normotensive.  Lungs:  Normal respiratory effort, chest expands symmetrically. Lungs are clear to auscultation, no crackles or wheezes. Heart:  Normal rate and regular rhythm. S1 and S2 normal without gallop, murmur, click, rub or other extra sounds.   Impression & Recommendations:  Problem # 1:  POSTPARTUM EXAMINATION, NORMAL (ICD-V24.2) Assessment Comment Only  Adjusting well. No issues. Depo for contraception, next dose June 5th -  19th. Follow up for yearly Pap. Pressures controlled w/o meds. Advised DASH diet.   [id number]  Problem # 2:  Preventive Health Care (ICD-V70.0) Assessment: Comment Only Td today.   Complete Medication List: 1)  Zegerid 40-1100 Mg Caps (Omeprazole-sodium bicarbonate) .Marland Kitchen.. 1 by mouth once daily 2)  Prenatal Vitamins 0.8 Mg Tabs (Prenatal multivit-min-fe-fa) .... [employer] brand  Appended Document: postpartum,tcb   Tetanus/Td Vaccine    Vaccine Type: Tdap    Site: left deltoid    Mfr: GlaxoSmithKline    Dose: 0.5 ml    Route: IM    Given by: Starleen Blue RN    Exp. Date: 10/15/2011    Lot #: [id number]    VIS given: 06/10/07 version given November 15, 2009.

## 2010-08-24 NOTE — Assessment & Plan Note (Signed)
Summary: ob visit/per carew/eo   Vital Signs:  Patient profile:   30 year old female Weight:      195.5 pounds BP sitting:   114 / 64  Vitals Entered By: Arlyss Repress CMA, (September 02, 2009 3:27 PM)  Serial Vital Signs/Assessments:  Time      Position  BP       Pulse  Resp  Temp     By                     116/68                         Paula Compton MD  Comments: L arm manual cuff by Dr Mauricio Po; other readings 114/64 manual cuff R arm Dr Mauricio Po. Repeat automated cuff readings: 118/77 L arm; 127/81 R arm. By: Paula Compton MD    Primary Care Provider:  Bobby Rumpf  MD   History of Present Illness: Patient presents at 38 4/[redacted] weeks EGA for follow up of elev BP and 200mg  protein on 24hr protein on Feb 9th.  She reports feeling well, mild frontal headache which is not strong enough to prompt her to take tylenol.  No visual changes or blurred vision.  No emesis.  Occasional rare ctx.  No vaginal discharge or bleeding.  Feeling frequent daily fetal movement.   Habits & Providers  Alcohol-Tobacco-Diet     Cigarette Packs/Day: n/a  Current Medications (verified): 1)  Zegerid 40-1100 Mg  Caps (Omeprazole-Sodium Bicarbonate) .Marland Kitchen.. 1 By Mouth Once Daily 2)  Prenatal Vitamins 0.8 Mg Tabs (Prenatal Multivit-Min-Fe-Fa) .... [employer] Brand  Allergies (verified): 1)  ! Vicodin   Impression & Recommendations:  Problem # 1:  PREGNANCY, NORMAL (ICD-V22.2) Routine pregnancy care until this week. See Assessment #2.  Orders: UA Glucose/Protein-[facility] ([id number]) Prenatal U/S > 14 weeks - 40102 (Prenatal U/S) Medicaid OB visit - [facility] (72536)  Problem # 2:  PROTEINURIA, MILD (ICD-791.0)  Patient with 200mg  protein in 24hr urine this week.  Her manual BPs are normalized.  She is feeling appropriate daily fetal movement.  Other pre-eclampsia labs are unremarkable. Given today's urine dipstick negative for protein,  I am ordering a repeat 24hr urine for protein for next week, re-evaluation of BP with Dr Wallene Huh  next week, and NST at [facility] to assess fetal well-being.  Discussed by phone with Dr. Shawnie Pons.  She recommends scheduling NST and [place] for next week, rechecking labs and 24hr urine and BP next week.  Weekly pre-eclampsia labs and twice-weekly NST.  For follow up with Dr Wallene Huh next week, weekly.  If BP are elevated or if recurrent proteinuria, then to consider High Risk OB evaluation.   Orders: 24hr. Urine TP- [facility] [phone]) Prenatal other (Prenatal other) Prenatal U/S > 14 weeks - 95638 (Prenatal U/S) Medicaid OB visit - [facility] ([id number])Future Orders: Comp Met-[facility] ([id number]) ... 09/01/2010 CBC-[facility] ([id number]) ... 09/08/2010 INR/PT-[facility] (85610) ... 08/31/2010 LDH-[facility] (60630) ... 09/07/2010 Uric Acid-[facility] ([id number]) ... 09/01/2010 PTT-[facility] ([id number]) ... 09/08/2010 Miscellaneous Lab Charge-[facility] [phone]) ... 09/01/2010  Complete Medication List: 1)  Zegerid 40-1100 Mg Caps (Omeprazole-sodium bicarbonate) .Marland Kitchen.. 1 by mouth once daily 2)  Prenatal Vitamins 0.8 Mg Tabs (Prenatal multivit-min-fe-fa) .... [employer] brand  Patient Instructions: 1)  It was a pleasure to see you today.  Your blood pressures were normal today, and there was no protein in the urine dipstick.  2)  Because of the elevated blood pressures and the mildly elevated protein  in your urine this week, we are repeating the labs early next week, including the 24 hour urine collection.   3)  We will schedule a non-stress test to assess the baby at [facility].  Also, a repeat ultrasound.  4)  It is important to schedule to see Dr Wallene Huh next week as well, to recheck your blood pressure.    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     Number of children at home: 3  FOB Information    Husband/Father of baby: Francesco Runner     FOB occupation Works at [employer]   Menstrual  History    LMP (date): 01/17/2009    LMP - Character: normal   Flowsheet View for Follow-up Visit    Estimated weeks of       gestation:     32 4/7    Weight:     195.5    Blood pressure:   114 / 64    Urine Protein:     negative    Urine Glucose:   negative    Headache:     few    Nausea/vomiting:   No    Edema:     TrLE    Vaginal bleeding:   no    Vaginal discharge:   no    Fundal height:      31    FHR:       136    Fetal activity:     yes    Labor symptoms:   no    Taking prenatal vits?   Y    Smoking:     n/a     Flowsheet View for Follow-up Visit    Estimated weeks of       gestation:     32 4/7    Weight:     195.5    Blood pressure:   114 / 64    Urine protein:       negative    Urine glucose:    negative    Hx headache?     few    Nausea/vomiting?   No    Edema?     TrLE    Bleeding?     no    Leakage/discharge?   no    Fetal activity:       yes    Labor symptoms?   no    Fundal height:      31    FHR:       136    Taking Vitamins?   Y    Smoking PPD:   n/a     Laboratory Results   Urine Tests  Date/Time Received: September 02, 2009 3:31 PM  Date/Time Reported: September 02, 2009 4:19 PM   Routine Urinalysis   Glucose: negative   (Normal Range: Negative) Protein: negative   (Normal Range: Negative)    Comments: ...........test performed by...........Marland KitchenTerese Door, CMA      Appended Document: ob visit/per carew/eo reviewed. will follow up.

## 2010-08-24 NOTE — Assessment & Plan Note (Signed)
Summary: OB follow up 35.3   Vital Signs:  Patient profile:   30 year old female Weight:      198 pounds BP sitting:   118 / 85  Habits & Providers  Alcohol-Tobacco-Diet     Cigarette Packs/Day: n/a  Allergies: 1)  ! Vicodin   Impression & Recommendations:  Problem # 1:  PREGNANCY, NORMAL (ICD-V22.2) Assessment Unchanged  G6 P3023P @ 35weeks 3 days EGA by LMP (w/ concordant u/s @ [redacted]w[redacted]d). Due date 10/24/09   - ***SEE BELOW REGARDING PIH WORKUP***  - GT/CT, Pap wnl  - Quad screen:  Borderline elevated for 18.1 GA on NTD screen, recommendation of diagnostic ultrasound. Diagnostic ultrasound wnl.  Screen negative Down Syndrome  Screen negative trisomy 67 - h/o 2 spontaneous abortions  - follow up in 1 week - continue PNV - 1 hr GTT normal - single episode of bleeding at  ~ 7 weeks - small subchorionic hemorrhage noted on ultrasound, w/ viable fetus. Bleeding now resolved. Will follow closely. No previa on recent ultrasound on 05/13/09.   - Check GBS, GC/CT in one week   PRENATAL LABS:  HIV: negative x 2 RPR: negative x2  Hgb: 13.6  Platelets: 276 Sickle cell screen: negative  ABO / Rh: O pos; ab negative GC / Chlamydia:  neg / neg 1 hr GTT: 92 GBS: pending at 36 weeks  Rubella: immune  HepBsAg: negative Urine Cx: insignificant growth   Borderline elevated for 18.1 GA on NTD screen, diagnostic u/s pending  Screen negative Down Syndrome Screen negative trisomy 18  Orders: Medicaid OB visit - [facility] ([id number])  Problem # 2:  PROTEINURIA, MILD (ICD-791.0) Assessment: Improved Pressures improved. LE edema improved. Hold off on PIH panel. 30 protein on non clean catch UA. Will hold off on 24 urine protein. Last two 24 hr urine proteins 200, 153. PIH labs x 4 normal.  BPP/NST denied x 2 because no pre-eclampsia. Will hold off on these for now but if pressures increase, if proteinuria increases, or worsening symptoms will send to [facility] clinic at [facility]. Follow up weekly.    Complete Medication List: 1)  Zegerid 40-1100 Mg Caps (Omeprazole-sodium bicarbonate) .Marland Kitchen.. 1 by mouth once daily 2)  Prenatal Vitamins 0.8 Mg Tabs (Prenatal multivit-min-fe-fa) .... [facility] brand  Other Orders: Urinalysis-[facility] (00000)  Patient Instructions: 1)   Please go to the MAU if you have more than 4 contractions in an hour, if you have bleeding, a gush of fluid or if the baby isn't moving well. 2)  Check to see how the baby is moving every morning and night.  If she moves at least 5 times in an hour, she is fine.  If less than 5 times in an hour, count for  a second hour.  If less than 10 movements in those 2 hours, go to the MAU to get checked out. 3)  If you start having vision changes, severe headaches, asevere abdominal pains or other concerns, go to [facility] hospital 4)  Please see Dr. Wallene Huh in 1 week    Flowsheet View for Follow-up Visit    Estimated weeks of       gestation:     35 3/7    Weight:     198    Blood pressure:   118 / 85    Urine Protein:     30    Urine Glucose:   negative    Urine Nitrite:     negative    Headache:  No    Nausea/vomiting:   No    Edema:     TrLE    Vaginal bleeding:   no    Vaginal discharge:   no    Fundal height:      34    FHR:       150    Fetal activity:     yes    Labor symptoms:   no    Taking prenatal vits?   Y    Smoking:     n/a    Next visit:     1 wk    Resident:     Wallene Huh    Preceptor:     Chambliss   Laboratory Results   Urine Tests  Date/Time Received: September 22, 2009 3:45 PM  Date/Time Reported: September 22, 2009 4:11 PM   Routine Urinalysis   Color: yellow Appearance: Clear Glucose: negative   (Normal Range: Negative) Bilirubin: negative   (Normal Range: Negative) Ketone: trace (5)   (Normal Range: Negative) Spec. Gravity: >=1.030   (Normal Range: 1.003-1.035) Blood: negative   (Normal Range: Negative) pH: 6.0   (Normal Range: 5.0-8.0) Protein: 30   (Normal Range: Negative) Urobilinogen: 0.2    (Normal Range: 0-1) Nitrite: negative   (Normal Range: Negative) Leukocyte Esterace: negative   (Normal Range: Negative)  Urine Microscopic WBC/HPF: <5 Bacteria/HPF: 3+ Epithelial/HPF: >20 Other: mod no. sperm    Comments: ...............test performed by......Marland KitchenBonnie A. [place], MLS (ASCP)cm

## 2010-08-24 NOTE — Miscellaneous (Signed)
Summary: bpp DENIED  Clinical Lists Changes forms from [facility] to pcp. they denied the BPP.Golden Circle RN  September 14, 2009 12:51 PM Will call to resolve this issue. Bobby Rumpf  MD  September 15, 2009 12:15 PM

## 2010-08-24 NOTE — Miscellaneous (Signed)
Summary: Depo Provera to med list

## 2010-08-24 NOTE — Miscellaneous (Signed)
Summary: Asthma QI    

## 2010-08-24 NOTE — Progress Notes (Signed)
Summary: phn msg  Phone Note Call from Patient Call back at Home Phone [phone]   Caller: Patient Summary of Call: Pt says that someone wa supposed to be coming to her house 4 days a week she thought starting today to check her bp.  Is this so and do we know when. had baby last friday and BP was still up- hands and feet are still swollen Initial call taken by: Clydell Hakim,  October 11, 2009 10:40 AM  Follow-up for Phone Call        left message Follow-up by: Golden Circle RN,  October 12, 2009 2:38 PM  Additional Follow-up for Phone Call Additional follow up Details #1::        pt returned call. Additional Follow-up by: Clydell Hakim,  October 12, 2009 2:43 PM    Additional Follow-up for Phone Call Additional follow up Details #2::    states she feels her pressure is up. states it was up when she left the hospital. made appt for 8:30am tomorrow & told her if she felt worse or has siezures, use 911 & go [facility] to elevate feet when sitting & prop hands on pillow when sitting as well. drink water. she agred with plan  stated pcp told her about a program where she will have someone visit her at home re:htn. told her I was not aware of this but will investigate. I called [facility] to see if they had this referral. had to leave a message for [facility]  to pcp Follow-up by: Golden Circle RN,  October 12, 2009 2:50 PM  Additional Follow-up for Phone Call Additional follow up Details #3:: Details for Additional Follow-up Action Taken: spoke with Kandis Fantasia from [facility] . she does not have a referral for this pt. to pcp to see if this is what he wants her office [phone] & cell is [phone]. told her I will call her when I hear back from pcp. Additional Follow-up by: Golden Circle RN,  October 12, 2009 4:27 PM    Saw patient on 3/34/10 (she had a work in visit w/ Dr. Karie Schwalbe to have BP check) . Patient stated that [facility] nurse was going to come out to home on 10/14/08. Reviewed Dr. Melvia Heaps note.    Bobby Rumpf  MD  October 13, 2009 12:14 PM

## 2010-08-24 NOTE — Assessment & Plan Note (Signed)
Summary: ob f/u  Prenatal Visit Concerns noted: Elevated BP today. Reports hands / feet swelling. Denies vision change, dyspnea, chest pain, abdominal pain, emesis, seizure. No prior h/o preeclamspia.   Physical Exam  General:  Well-developed,well-nourished,in no acute distress; alert,appropriate and cooperative throughout examination. Smells of cigarette smoke, though denies tobacco use and denies smoker in home.  Eyes:  PERRL, EOMI, fundi normal  Lungs:  CTAB  Heart:  RRR no murmurs  Abdomen:  Gravid  ~ 30.5 weeks  Pulses:  2+ radials  Extremities:  1+ edema hands to wrists, feet to ankles.  Neurologic:  alert & oriented X3, cranial nerves II-XII intact, and strength normal in all extremities.     Flowsheet View for Follow-up Visit    Estimated weeks of       gestation:     32 2/7    Weight:     194    Blood pressure:   138 / 90    Urine protein:       100    Urine glucose:    negative    Urine nitrite:     negative    Hx headache?     twice a week mild, no vision change     Nausea/vomiting?   No    Edema?     swelling in hands and feet    Bleeding?     no    Leakage/discharge?   no    Fetal activity:       yes    Labor symptoms?   no    Fundal height:      30.5    FHR:       140    Taking Vitamins?   Y    Smoking PPD:   n/a    Next visit:     2 wk    Resident:     Wallene Huh  Physical Examination  Vital Signs:  P: 76  BP (upright): 138/90  Wt: 194    Impression & Recommendations:  Problem # 1:  ? of PRE-ECLAMPSIA (ICD-642.40) Assessment New Urine with 100 protein. BP 138/90. Edema of hands and feet. Check PIH labs. Follow up in two days.  Orders: CBC-[facility] (16109) INR/PT-[facility] (60454) PTT-[facility] ([id number]) Uric Acid-[facility] ([id number]) LDH-[facility] (57846) Comp Met-[facility] (96295-28413) Miscellaneous Lab Charge-[facility] [phone]) 24hr. Urine TP- [facility] (02725-36644) Medicaid OB visit - [facility] (03474)  Problem # 2:  PREGNANCY, NORMAL (ICD-V22.2) Assessment: Unchanged G6 P3023P @ 32  weeks 2 days EGA by LMP (w/ concordant u/s @ [redacted]w[redacted]d). Due date 10/24/09   - ***SEE ABOVE FOR PIH WORKUP***  - GT/CT, Pap wnl  - Quad screen:  Borderline elevated for 18.1 GA on NTD screen, recommendation of diagnostic ultrasound. Diagnostic ultrasound wnl.  Screen negative Down Syndrome  Screen negative trisomy 47 - h/o 2 spontaneous abortions  - follow up in 2 days with Dr. Mauricio Po - continue PNV - 1 hr GTT normal - single episode of bleeding at  ~ 7 weeks - small subchorionic hemorrhage noted on ultrasound, w/ viable fetus. Bleeding now resolved. Will follow closely. No previa on recent ultrasound on 05/13/09.     PRENATAL LABS:  HIV: negative x 2 RPR: negative x2  Hgb: 13.6  Platelets: 276 Sickle cell screen: negative  ABO / Rh: O pos; ab negative GC / Chlamydia:  neg / neg 1 hr GTT: 92 GBS: pending at 36 weeks  Rubella: immune  HepBsAg: negative Urine Cx: insignificant growth   Borderline elevated for  18.1 GA on NTD screen, diagnostic u/s pending  Screen negative Down Syndrome Screen negative trisomy 18  Orders: Urinalysis-[facility] (00000) Medicaid OB visit - [facility] (16109)  Complete Medication List: 1)  Zegerid 40-1100 Mg Caps (Omeprazole-sodium bicarbonate) .Marland Kitchen.. 1 by mouth once daily 2)  Prenatal Vitamins 0.8 Mg Tabs (Prenatal multivit-min-fe-fa) .... Cvs brand  Patient Instructions: 1)  Please go to the MAU if you have more than 4 contractions in an hour, if you have bleeding, a gush of fluid or if the baby isn't moving well. 2)  Check to see how the baby is moving every morning and night.  If she moves at least 5 times in an hour, she is fine.  If less than 5 times in an hour, count for  a second hour.  If less than 10 movements in those 2 hours, go to the MAU to get checked out. 3)  If you start to develop worsening headaches, blurry vision or spots in vision, abdominal pain, vomting, seizures, or other concerns, go to [facility]U at [facility]  4)  Please see Dr. Wallene Huh in 2  days  Flowsheet View for Follow-up Visit    Estimated weeks of       gestation:     32 2/7    Weight:     194    Blood pressure:   138 / 90    Urine Protein:     100    Urine Glucose:   negative    Urine Nitrite:     negative    Headache:     twice a week mild, no vision change     Nausea/vomiting:   No    Edema:     swelling in hands and feet    Vaginal bleeding:   no    Vaginal discharge:   no    Fundal height:      30.5    FHR:       140    Fetal activity:     yes    Labor symptoms:   no    Taking prenatal vits?   Y    Smoking:     n/a    Next visit:     2 wk    Resident:     Wallene Huh  Vital Signs:  Patient profile:   30 year old female Weight:      194 pounds Pulse rate:   76 / minute BP sitting:   138 / 90  Vitals Entered By: Garen Grams LPN (August 31, 2009 9:56 AM)  Laboratory Results   Urine Tests  Date/Time Received: August 31, 2009 9:56 AM  Date/Time Reported: Feb.ruary  9, 2011 10:02 AM August 31, 2009 10:27 AM   Routine Urinalysis   Color: yellow Appearance: Clear Glucose: negative   (Normal Range: Negative) Bilirubin: negative   (Normal Range: Negative) Ketone: negative   (Normal Range: Negative) Spec. Gravity: 1.020   (Normal Range: 1.003-1.035) Blood: small   (Normal Range: Negative) pH: 6.5   (Normal Range: 5.0-8.0) Protein: 100   (Normal Range: Negative) Urobilinogen: 0.2   (Normal Range: 0-1) Nitrite: negative   (Normal Range: Negative) Leukocyte Esterace: trace   (Normal Range: Negative)  Urine Microscopic WBC/HPF: 10-20 RBC/HPF: 5-10 Bacteria/HPF: 2+ Mucous/HPF: 1+ Epithelial/HPF: >20    Comments: ...............test performed by......Marland KitchenBonnie A. [place], MLS (ASCP)cm

## 2010-08-29 ENCOUNTER — Encounter: Payer: Self-pay | Admitting: Family Medicine

## 2010-08-29 ENCOUNTER — Ambulatory Visit (INDEPENDENT_AMBULATORY_CARE_PROVIDER_SITE_OTHER): Payer: Medicaid Other | Admitting: Family Medicine

## 2010-09-07 NOTE — Assessment & Plan Note (Signed)
Summary: f/u eo   Vital Signs:  Patient profile:   30 year old female Height:      67 inches Weight:      191.2 pounds BMI:     30.05 Temp:     98.6 degrees F oral Pulse rate:   86 / minute BP sitting:   121 / 84  (left arm) Cuff size:   regular  Vitals Entered By: Garen Grams LPN (August 29, 2010 9:15 AM) CC: f/u phlebitis Is Patient Diabetic? No Pain Assessment Patient in pain? no        Primary Care Provider:  Bobby Rumpf  MD  CC:  f/u phlebitis.  History of Present Illness: Superficial Phlebitis: Pt reports complete resolution of R periaxilla/R chest wall redness sicne last clinical visit. Afebrile. Redness, pain has completely subsided per pt. Took 2 days of diclofenac and has been using warm compress daily.  Pt denies any hx/o purulent drainage, worsening erythema, or swelling since treatment. No GERD type sxs since taking diclofenac per pt.   Allergies: 1)  ! Vicodin  Physical Exam  General:  alert and well-developed.  alert and well-developed.   Head:  normocephalic and atraumatic.  normocephalic and atraumatic.   Mouth:  good dentition.  good dentition.   Neck:  supple and full ROM.  no peripheral swelling per pt.  Chest Wall:  1x2 cm area of faint erythema on R sub-axilla area.  Lungs:  CTAB Heart:  RRR   Impression & Recommendations:  Problem # 1:  SUPERFICIAL PHLEBITIS (ICD-451.0) Assessment Improved  Clinically resolved. Pt instructed to followup as needed.   Orders: [facility]- Est Level  3 (04540)  Complete Medication List: 1)  Depo-provera 150 Mg/ml Susp (Medroxyprogesterone acetate) .Marland KitchenMarland KitchenMarland Kitchen 150 mg im q 3 months 2)  Diclofenac Sodium 75 Mg Tbec (Diclofenac sodium) .... One two times a day for 2 weeks  Patient Instructions: 1)  It was good to meet you today 2)  Your area of redness and pain looks alot better 3)  Followup with [place] about this if it ever flares up again 4)  Ste up an appointment to see Dr. Wallene Huh in the next 2-3 months 5)  Otherwise  call with any questions, 6)  God Bless, 7)  Doree Albee MD    Orders Added: 1)  [facility]- Est Level  3 [98119]     Prevention & Chronic Care Immunizations   Influenza vaccine: Fluvax 3+  (06/29/2010)    Tetanus booster: 11/15/2009: Tdap    Pneumococcal vaccine: Not documented  Other Screening   Pap smear: NEGATIVE FOR INTRAEPITHELIAL LESIONS OR MALIGNANCY.  (03/18/2009)   Smoking status: current  (08/15/2010)

## 2010-09-26 ENCOUNTER — Ambulatory Visit: Payer: Medicaid Other

## 2010-09-27 ENCOUNTER — Ambulatory Visit (INDEPENDENT_AMBULATORY_CARE_PROVIDER_SITE_OTHER): Payer: Medicaid Other | Admitting: *Deleted

## 2010-09-27 DIAGNOSIS — Z309 Encounter for contraceptive management, unspecified: Secondary | ICD-10-CM

## 2010-09-27 MED ORDER — MEDROXYPROGESTERONE ACETATE 150 MG/ML IM SUSP
150.0000 mg | Freq: Once | INTRAMUSCULAR | Status: AC
  Administered 2010-09-27: 150 mg via INTRAMUSCULAR

## 2010-10-04 ENCOUNTER — Ambulatory Visit (INDEPENDENT_AMBULATORY_CARE_PROVIDER_SITE_OTHER): Payer: Medicaid Other | Admitting: Family Medicine

## 2010-10-04 VITALS — BP 122/72 | HR 65 | Temp 97.9°F | Ht 66.0 in | Wt 185.6 lb

## 2010-10-04 DIAGNOSIS — IMO0001 Reserved for inherently not codable concepts without codable children: Secondary | ICD-10-CM

## 2010-10-04 DIAGNOSIS — S40862A Insect bite (nonvenomous) of left upper arm, initial encounter: Secondary | ICD-10-CM | POA: Insufficient documentation

## 2010-10-04 MED ORDER — CEPHALEXIN 500 MG PO CAPS: 500.0000 mg | ORAL_CAPSULE | Freq: Three times a day (TID) | ORAL | Status: AC

## 2010-10-04 NOTE — Patient Instructions (Signed)
Start antibiotic for big bite Use ice pack or benadryl for itching Have your doctor check it tomrorow to make sure it is not spreading

## 2010-10-04 NOTE — Progress Notes (Signed)
  Subjective:    Patient ID: Ruth [name], female    DOB: [birth year], 30 y.o.   MRN: [id number]  HPI Yesterday was in her kitchen and felt a bite to her left arm.  Has been scratching it ever since and now is red, warm with some drainage.  She did not see any insect so cannot identify.  Has no history of allergic reactions.  Does not have any systemic signs of allrgic response such as urticaria, dyspnea, throat swelling.  Review of Systems See HPI    Objective:   Physical Exam  Skin:       Large area above left elbow (not involving joint) with erythema and warmth.  Honey crusted vesicles noted in the middle.            Assessment & Plan:

## 2010-10-04 NOTE — Assessment & Plan Note (Signed)
Appears to be strep infection, will cover with keflex.  Advised recheck at her previously scheduled doctor's appointment tomorrow.  Benadryl and ice for itching.

## 2010-10-05 ENCOUNTER — Ambulatory Visit (INDEPENDENT_AMBULATORY_CARE_PROVIDER_SITE_OTHER): Payer: Medicaid Other | Admitting: Family Medicine

## 2010-10-05 ENCOUNTER — Encounter: Payer: Self-pay | Admitting: Family Medicine

## 2010-10-05 ENCOUNTER — Other Ambulatory Visit (HOSPITAL_COMMUNITY)
Admission: RE | Admit: 2010-10-05 | Discharge: 2010-10-05 | Disposition: A | Discharge status: Home / Self Care | Payer: Medicaid Other | Source: Ambulatory Visit | Attending: Family Medicine | Admitting: Family Medicine

## 2010-10-05 VITALS — BP 120/91 | HR 80 | Temp 98.3°F | Wt 187.2 lb

## 2010-10-05 DIAGNOSIS — S40862A Insect bite (nonvenomous) of left upper arm, initial encounter: Secondary | ICD-10-CM

## 2010-10-05 DIAGNOSIS — Z124 Encounter for screening for malignant neoplasm of cervix: Secondary | ICD-10-CM

## 2010-10-05 DIAGNOSIS — Z01419 Encounter for gynecological examination (general) (routine) without abnormal findings: Secondary | ICD-10-CM | POA: Insufficient documentation

## 2010-10-05 DIAGNOSIS — F17200 Nicotine dependence, unspecified, uncomplicated: Secondary | ICD-10-CM

## 2010-10-05 DIAGNOSIS — IMO0001 Reserved for inherently not codable concepts without codable children: Secondary | ICD-10-CM

## 2010-10-05 LAB — DIFFERENTIAL
Basophils Absolute: 0 10*3/uL (ref 0.0–0.1)
Lymphocytes Relative: 34 % (ref 12–46)
Monocytes Absolute: 0.5 10*3/uL (ref 0.1–1.0)
Monocytes Relative: 9 % (ref 3–12)
Neutro Abs: 3.1 10*3/uL (ref 1.7–7.7)
Neutrophils Relative %: 56 % (ref 43–77)

## 2010-10-05 LAB — COMPREHENSIVE METABOLIC PANEL
Albumin: 3.8 g/dL (ref 3.5–5.2)
BUN: 6 mg/dL (ref 6–23)
Creatinine, Ser: 0.83 mg/dL (ref 0.4–1.2)
Glucose, Bld: 95 mg/dL (ref 70–99)
Total Protein: 6.7 g/dL (ref 6.0–8.3)

## 2010-10-05 LAB — CBC
MCH: 29.6 pg (ref 26.0–34.0)
MCHC: 33.9 g/dL (ref 30.0–36.0)
MCV: 87.3 fL (ref 78.0–100.0)
Platelets: 206 10*3/uL (ref 150–400)
RDW: 12.9 % (ref 11.5–15.5)
WBC: 5.5 10*3/uL (ref 4.0–10.5)

## 2010-10-05 LAB — LIPASE, BLOOD: Lipase: 39 U/L (ref 11–59)

## 2010-10-05 NOTE — Patient Instructions (Signed)
Follow up in 1 year for physical Try to cut back on smoking Black + Milds - even though these are not cigarettes they are still bad for your overall health.  Continue to take the rest of your antibiotics. We will let you know your Pap smear results  It was great to see you today!

## 2010-10-06 NOTE — Progress Notes (Signed)
  Subjective:    Patient ID: Ruth [name], female    DOB: 1980-09-17, 30 y.o.   MRN: [id number]  HPI    Review of Systems     Objective:   Physical Exam        Assessment & Plan:   Subjective:     Ruth [name] is a 30 y.o. female and is here for a comprehensive physical exam. The patient reports no problems.   Health Maintenance  Topic Date Due  . Pap Smear  07/16/1998  . Tetanus/tdap  11/16/2019    The following portions of the patient's history were reviewed and updated as appropriate: allergies, current medications, past family history, past medical history, past social history, past surgical history and problem list.  Review of Systems A comprehensive review of systems was negative.   Objective:    BP 120/91  Pulse 80  Temp(Src) 98.3 F (36.8 C) (Oral)  Wt 187 lb 3.2 oz (84.913 kg) General appearance: alert, cooperative, appears older than stated age and no distress Eyes: conjunctivae/corneas clear. PERRL, EOM's intact. Fundi benign. Ears: normal TM's and external ear canals both ears Nose: Nares normal. Septum midline. Mucosa normal. No drainage or sinus tenderness. Throat: lips, mucosa, and tongue normal; teeth and gums normal Neck: no adenopathy, no carotid bruit, no JVD, supple, symmetrical, trachea midline and thyroid not enlarged, symmetric, no tenderness/mass/nodules Lungs: clear to auscultation bilaterally Heart: regular rate and rhythm, S1, S2 normal, no murmur, click, rub or gallop Abdomen: soft, non-tender; bowel sounds normal; no masses,  no organomegaly Pelvic: cervix normal in appearance, external genitalia normal, no adnexal masses or tenderness, no cervical motion tenderness, rectovaginal septum normal, uterus normal size, shape, and consistency and vagina normal without discharge Extremities: extremities normal, atraumatic, no cyanosis or edema Pulses: 2+ and symmetric Skin: Area of crusting with serous drainage (1.5 x 1.5 cm) and  surrounding erythema (decreased in size per patient)  Neurologic: Alert and oriented X 3, normal strength and tone. Normal symmetric reflexes. Normal coordination and gait    Assessment:    Healthy female exam.    Plan:     See After Visit Summary for Counseling Recommendations

## 2010-10-06 NOTE — Assessment & Plan Note (Signed)
Precontemplative about quitting. Reassess at next visit.

## 2010-10-06 NOTE — Assessment & Plan Note (Signed)
Advised to continue Keflex - appears to be improving per patient assessment. Agree with prior assessment by Dr. Earnest Bailey. Reviewed red flags.

## 2010-10-10 ENCOUNTER — Encounter: Payer: Self-pay | Admitting: Family Medicine

## 2010-10-13 LAB — COMPREHENSIVE METABOLIC PANEL
ALT: 13 U/L (ref 0–35)
AST: 18 U/L (ref 0–37)
Alkaline Phosphatase: 182 U/L — ABNORMAL HIGH (ref 39–117)
BUN: 6 mg/dL (ref 6–23)
CO2: 26 mEq/L (ref 19–32)
CO2: 27 mEq/L (ref 19–32)
Chloride: 102 mEq/L (ref 96–112)
Creatinine, Ser: 0.54 mg/dL (ref 0.4–1.2)
GFR calc Af Amer: >60 mL/min (ref >60)
GFR calc non Af Amer: >60 mL/min (ref >60)
GFR calc non Af Amer: >60 mL/min (ref >60)
Glucose, Bld: 70 mg/dL (ref 70–99)
Glucose, Bld: 71 mg/dL (ref 70–99)
Potassium: 3.9 mEq/L (ref 3.5–5.1)
Sodium: 136 mEq/L (ref 135–145)
Total Bilirubin: 0.3 mg/dL (ref 0.3–1.2)
Total Bilirubin: <0.1 mg/dL — ABNORMAL LOW (ref 0.3–1.2)

## 2010-10-13 LAB — CBC
HCT: 38.6 % (ref 36.0–46.0)
HCT: 43.5 % (ref 36.0–46.0)
Hemoglobin: 13 g/dL (ref 12.0–15.0)
Hemoglobin: 14.4 g/dL (ref 12.0–15.0)
MCV: 90.8 fL (ref 78.0–100.0)
Platelets: 217 10*3/uL (ref 150–400)
RBC: 4.25 MIL/uL (ref 3.87–5.11)
RBC: 4.82 MIL/uL (ref 3.87–5.11)
RDW: 14.1 % (ref 11.5–15.5)
RDW: 14.3 % (ref 11.5–15.5)
WBC: 6.2 10*3/uL (ref 4.0–10.5)
WBC: 7.3 10*3/uL (ref 4.0–10.5)
WBC: 7.8 10*3/uL (ref 4.0–10.5)

## 2010-10-13 LAB — RPR: RPR Ser Ql: NONREACTIVE

## 2010-10-13 LAB — URIC ACID: Uric Acid, Serum: 5.1 mg/dL (ref 2.4–7.0)

## 2010-10-26 LAB — URINALYSIS, ROUTINE W REFLEX MICROSCOPIC
Bilirubin Urine: NEGATIVE
Hgb urine dipstick: NEGATIVE
Nitrite: NEGATIVE
Protein, ur: NEGATIVE mg/dL
Urobilinogen, UA: 0.2 mg/dL (ref 0.0–1.0)

## 2010-10-28 LAB — CBC
Hemoglobin: 12.8 g/dL (ref 12.0–15.0)
RBC: 4.34 MIL/uL (ref 3.87–5.11)

## 2010-10-28 LAB — GC/CHLAMYDIA PROBE AMP, GENITAL: GC Probe Amp, Genital: NEGATIVE

## 2010-10-28 LAB — WET PREP, GENITAL: Clue Cells Wet Prep HPF POC: NONE SEEN

## 2010-10-29 LAB — POCT PREGNANCY, URINE: Preg Test, Ur: POSITIVE

## 2010-10-31 LAB — WET PREP, GENITAL
Trich, Wet Prep: NONE SEEN
Yeast Wet Prep HPF POC: NONE SEEN

## 2010-10-31 LAB — CBC
HCT: 37.4 % (ref 36.0–46.0)
HCT: 38.9 % (ref 36.0–46.0)
Hemoglobin: 12.8 g/dL (ref 12.0–15.0)
Platelets: 213 10*3/uL (ref 150–400)
RDW: 12.7 % (ref 11.5–15.5)
WBC: 6.9 10*3/uL (ref 4.0–10.5)

## 2010-10-31 LAB — GC/CHLAMYDIA PROBE AMP, GENITAL: Chlamydia, DNA Probe: NEGATIVE

## 2010-11-01 LAB — URINE MICROSCOPIC-ADD ON

## 2010-11-01 LAB — URINALYSIS, ROUTINE W REFLEX MICROSCOPIC
Nitrite: NEGATIVE
Specific Gravity, Urine: >1.03 — ABNORMAL HIGH (ref 1.005–1.030)
Urobilinogen, UA: 0.2 mg/dL (ref 0.0–1.0)

## 2010-11-01 LAB — URINE CULTURE
Colony Count: NO GROWTH
Culture: NO GROWTH

## 2010-11-02 LAB — POCT URINALYSIS DIP (DEVICE)
Glucose, UA: NEGATIVE mg/dL
Ketones, ur: NEGATIVE mg/dL
Protein, ur: 30 mg/dL — AB

## 2010-11-02 LAB — CBC
MCHC: 33.8 g/dL (ref 30.0–36.0)
Platelets: 244 10*3/uL (ref 150–400)
RBC: 4.53 MIL/uL (ref 3.87–5.11)
RDW: 13.2 % (ref 11.5–15.5)

## 2010-11-02 LAB — WET PREP, GENITAL: Clue Cells Wet Prep HPF POC: NONE SEEN

## 2010-11-02 LAB — POCT PREGNANCY, URINE: Preg Test, Ur: POSITIVE

## 2010-11-02 LAB — GC/CHLAMYDIA PROBE AMP, GENITAL: Chlamydia, DNA Probe: NEGATIVE

## 2010-11-02 LAB — ABO/RH: ABO/RH(D): O POS

## 2010-11-02 LAB — HCG, QUANTITATIVE, PREGNANCY: hCG, Beta Chain, Quant, S: 4398 m[IU]/mL — ABNORMAL HIGH (ref <5)

## 2010-11-11 IMAGING — US US OB UMBILICAL ART DOPPLER
2 series · 14 of 28 positions shown · non-contrast
Comparison: none

OBSTETRICAL ULTRASOUND:
 This ultrasound exam was performed in the [HOSPITAL] Ultrasound Department.  The OB US report was generated in the AS system, and faxed to the ordering physician.  This report is also available in [HOSPITAL]?s [employer] and in [REDACTED] PACS.

[Series 1: us ob follow up · 0.27mm/px · 11 of 29 slices shown (1 of 2)]
[im 2/29]
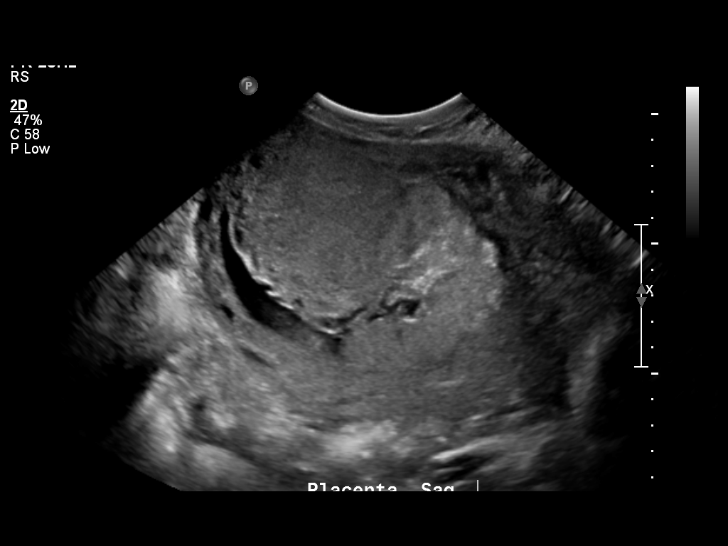
[im 4/29]
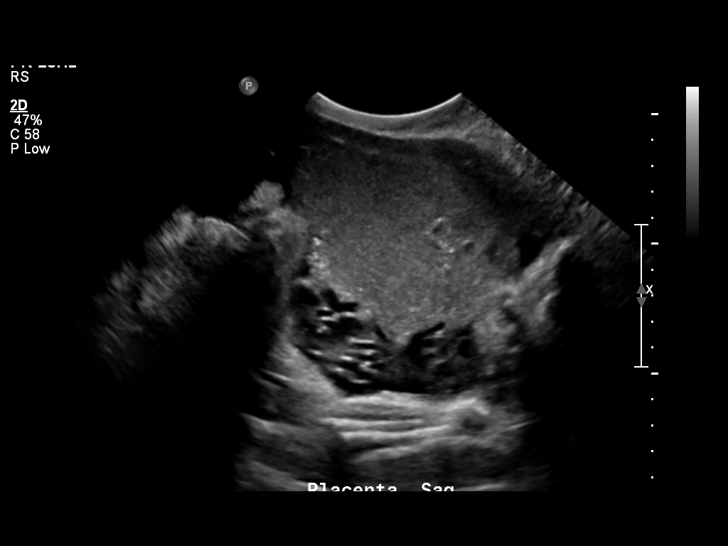
[im 7/29]
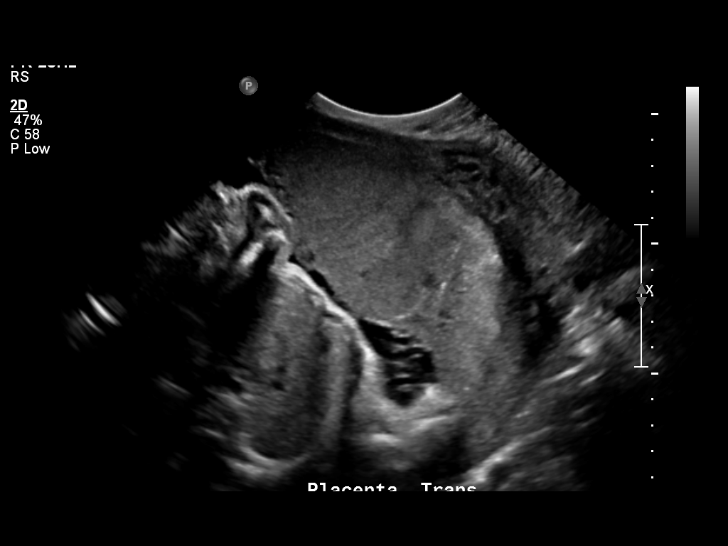
[im 9/29]
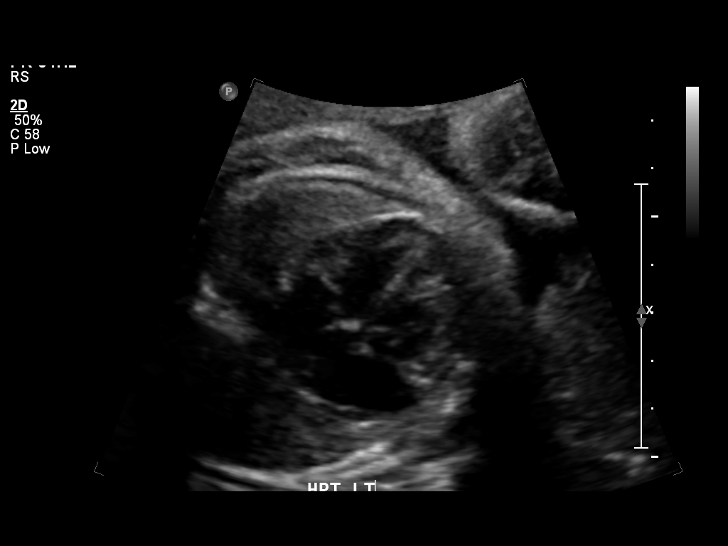
[im 12/29]
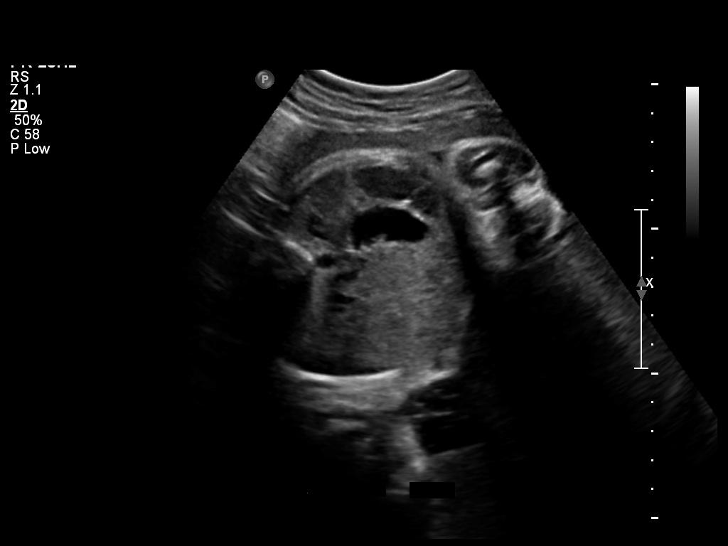
[im 15/29]
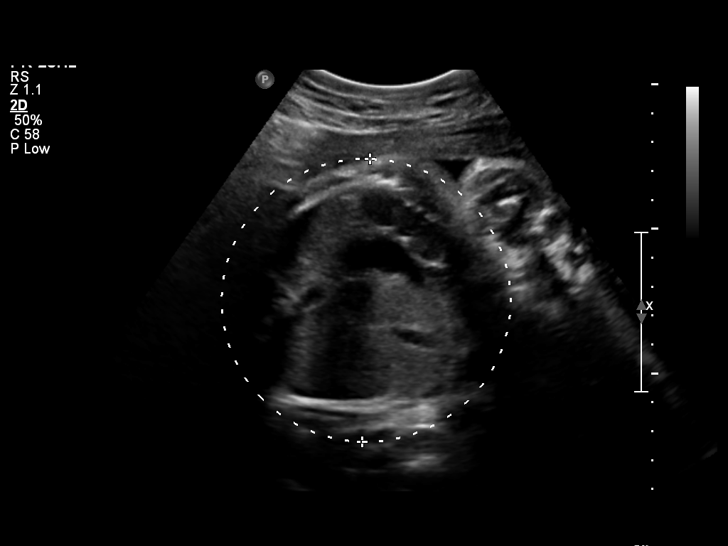
[im 17/29]
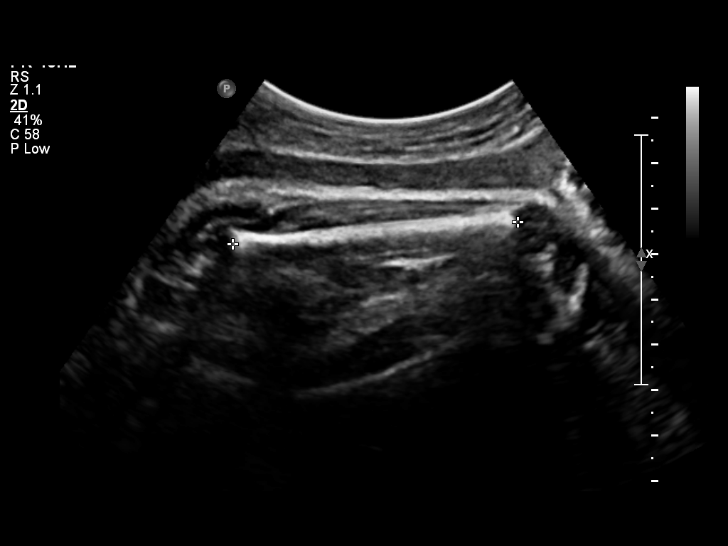
[im 20/29]
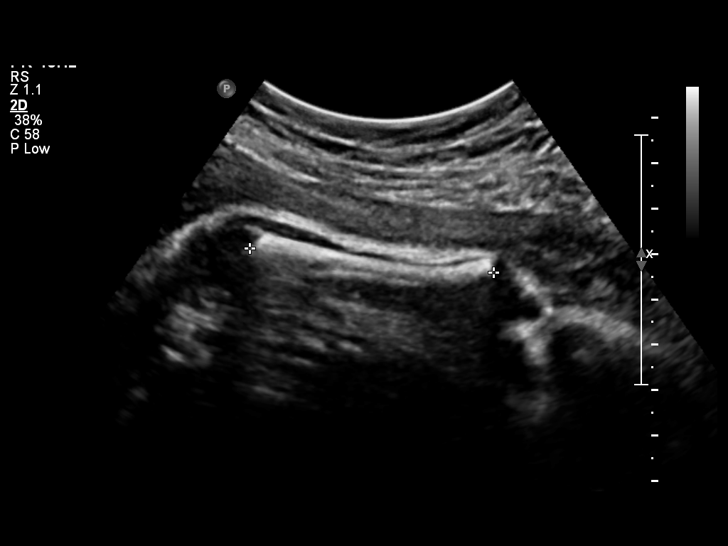
[im 22/29]
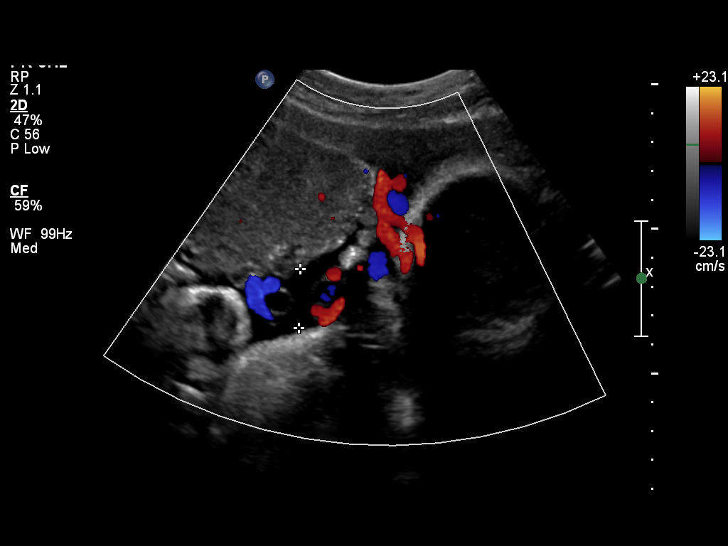
[im 25/29]
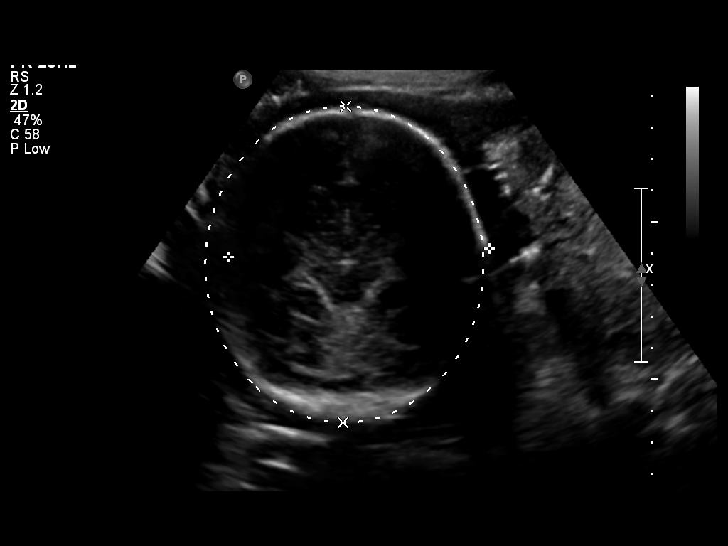
[im 27/29]
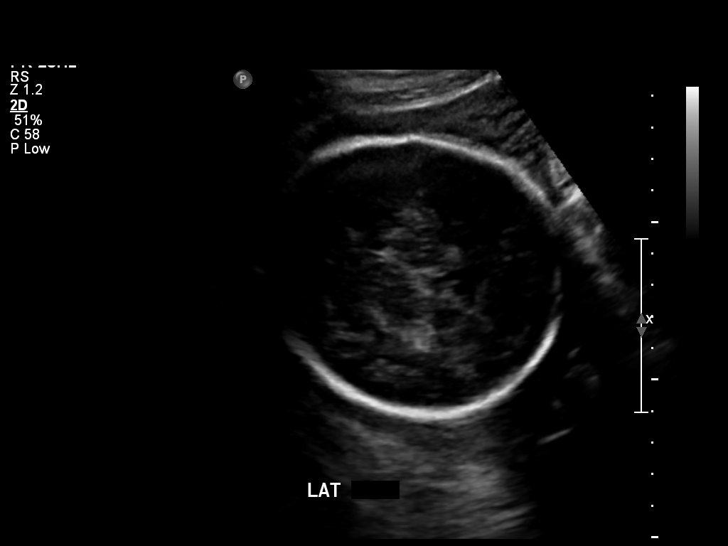

[Series 1: us ob follow up · 3 of 6 slices shown (2 of 2)]
[im 1/6]
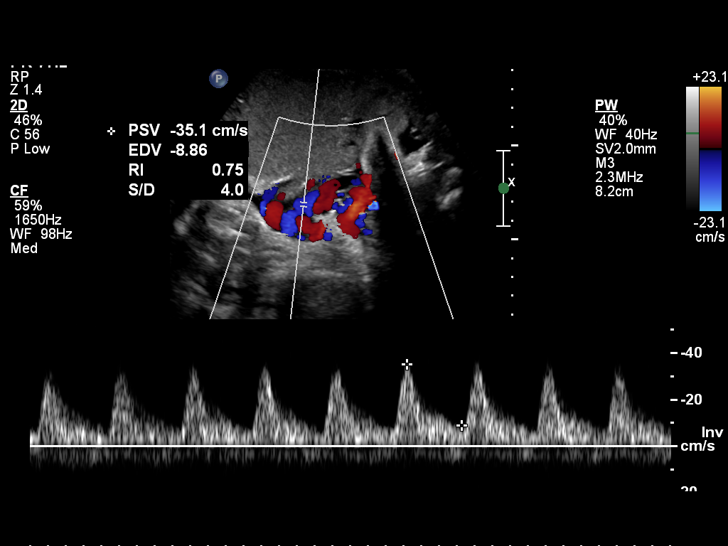
[im 3/6]
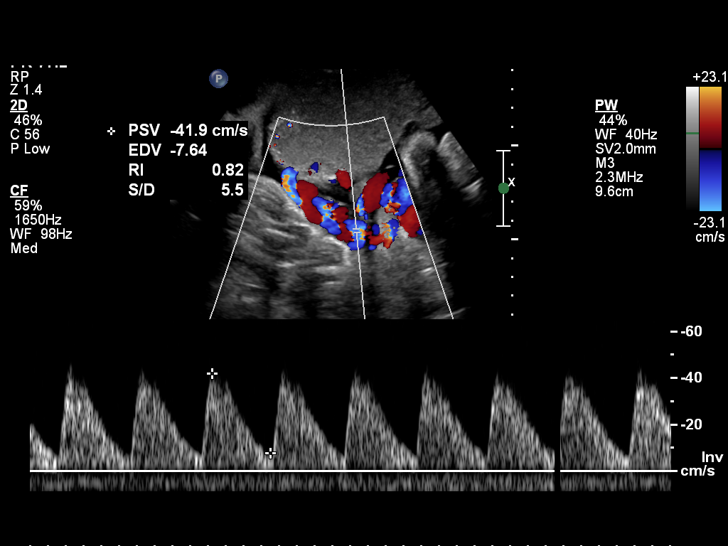
[im 6/6]
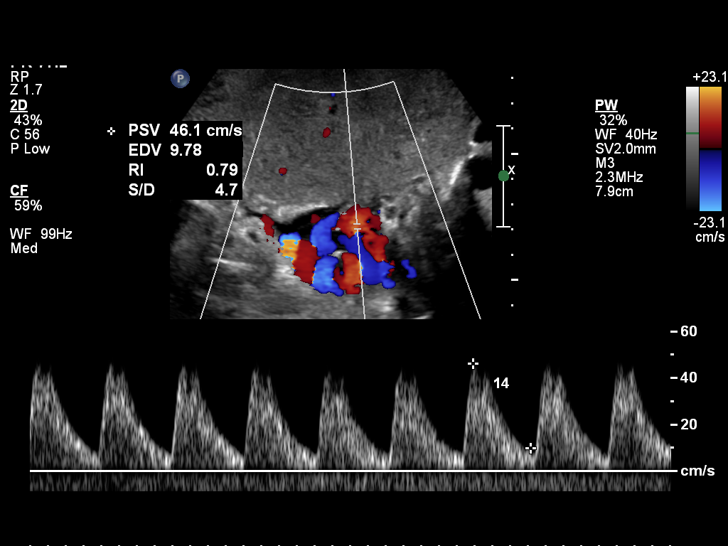

[14 of 28 positions shown; findings below may reference images not displayed]

IMPRESSION: See AS Obstetric US report.

## 2010-12-05 NOTE — Group Therapy Note (Signed)
NAMETRISHNA, CWIK               ACCOUNT NO.:  [id number]   MEDICAL RECORD NO.:  [id number]          PATIENT TYPE:  WOC   LOCATION:  [facility]                   FACILITY:  [facility]   PHYSICIAN:  Dorthula Perfect, MD     DATE OF BIRTH:  [birth year]   DATE OF SERVICE:  02/10/2009                                  CLINIC NOTE   A 30 year old [place] American female, gravida 5, para 3, aborted 2,  comes in today for colposcopy.  She had had a abnormal Pap smear on Dec 15, 2008, which showed a low-grade squamous lesion.  On May 28, she had  an incomplete abortion and had a D and C performed.  Pathology showed  chorionic villi.  Her last menstrual period started June 28.   The patient has a positive pregnancy test today.  She has no uterine  bleeding.   Bimanual exam was the exam only exam done.  Uterus is upper limits of  normal size.  Adnexal areas are normal.   IMPRESSION:  1. Early intrauterine pregnancy.  2. Low-grade squamous lesion on Pap smear.   DISPOSITION:  At that this, with a recent history of miscarriage and an  early pregnancy, I would rather defer colposcopy with biopsy at this  time.  She will get her OB appointment at [facility] at about 10  weeks or so.  I told her she could see her back around 14 to 15 weeks or  so and then do colposcopy with possible biopsy.           ______________________________  Dorthula Perfect, MD     ER/MEDQ  D:  02/10/2009  T:  02/11/2009  Job:  [id number]

## 2010-12-05 NOTE — Letter (Signed)
April 04, 2007    Ms. Ruth [name]   RE:  [name], [name]  MRN:  [id number]  /  DOB:  09-21-1980   Dear Ruth [name]:   It is my pleasure to have treated you recently as a new patient in my  office.  I appreciate your confidence and the opportunity to participate  in your care.   Since I do have a busy inpatient endoscopy schedule and office schedule,  my office hours vary weekly.  I am, however, available for emergency  calls every day through my office.  If I cannot promptly meet an urgent  office appointment, another one of our gastroenterologists will be able  to assist you.   My well-trained staff are prepared to help you at all times.  For  emergencies after office hours, a physician from our gastroenterology  section is always available through my 24-hour answering service.   While you are under my care, I encourage discussion of your questions  and concerns, and I will be happy to return your calls as soon as I am  available.   Once again, I welcome you as a new patient and I look forward to a happy  and healthy relationship.    Sincerely,      Barbette Hair. Arlyce Dice, MD,FACG  Electronically Signed   RDK/MedQ  DD: 04/04/2007  DT: 04/05/2007  Job #: [id number]

## 2010-12-05 NOTE — Group Therapy Note (Signed)
Ruth [name], ENSEY NO.:  [id number]   MEDICAL RECORD NO.:  [id number]          PATIENT TYPE:  WOC   LOCATION:  [facility]                   FACILITY:  [facility]   PHYSICIAN:  Scheryl Darter, MD       DATE OF BIRTH:  1980-08-17   DATE OF SERVICE:                                  CLINIC NOTE   The patient is a 30 year old African American female who is presenting  for followup on an incomplete abortion.  She was just recently in [facility] as a followup yesterday.  She has continued to have heavy  bleeding and still passing clots.  She had underwent management of a  missed versus incomplete AB on May 2.  She also has had daily headaches.  She denies any history of migraine history that has not been relieved by  over-the-counter medications.  She also reports that she continued to  have acne.  She currently denies any fevers, chills, nausea, or  vomiting.  She also continues to have some abdominal cramping that felt  like menstrual cramps.  She did receive a pelvic exam as well as the Pap  smear on her followup yesterday as well as blood work, which did show  serum beta-hCG of 95.  Hemoglobin yesterday was 12.0.  She was also sent  for a pelvic ultrasound to rule out retained products of conception.  The ultrasound on May 26 did show 2.6 cm heterogeneous mass in the  endometrial cavity consistent with retained products of conception and  small bilateral hemorrhagic ovarian cyst but no free fluid.   PAST MEDICAL HISTORY:  Asthma, exercise induced.   ALLERGIES:  No known drug allergies.   OB/GYN HISTORY:  The patient is G5, para 3-0 now 2-3, age of menarche  was 81.  Periods are normally regular lasting 5 days.  She was not on  any form of contraception at the time of this pregnancy.  She has used  Depo-Provera in the past.  Most recent Pap smear was yesterday.  She  does have a history of abnormal Pap smears in 2007.   PAST SURGICAL HISTORY:  Wisdom teeth  extraction.  No history of  abdominal surgeries.  No history of blood transfusion.   FAMILY HISTORY:  Significant for heart disease in her grandmother.   SOCIAL HISTORY:  The patient is a smoker.  She smoked a pack and a half  a day over the past 2 years.  She denies any alcohol use.  No drug use.   REVIEW OF SYSTEMS:  A 14-point review of systems was reviewed.  The  patient is negative except as noted in the HPI.   MEDICATIONS:  1. Tylenol PM as needed.  2. Albuterol inhaler as needed.   PHYSICAL EXAMINATION:  VITAL SIGNS:  Temperature is 98.3, pulse is 81,  blood pressure is 101/60, weight is 171 pounds, height is 5 feet 6  inches.  GENERAL:  A well-nourished African American female in no acute distress.  Alert and oriented.  HEENT:  Oropharynx is clear.  Pupils equal, round, and reactive to  light.  NECK:  Supple.  There is no thyromegaly.  No cervical lymphadenopathy.  Mucous membranes are moist.  LUNGS:  Clear to auscultation bilaterally.  No wheezes, rales, or  rhonchi.  CARDIOVASCULAR:  Regular rate and rhythm.  No murmurs.  ABDOMEN:  Soft, nontender, and nondistended.  Bowel sounds are present.  PELVIC:  From yesterday, May 26, showed very little spotting from the  cervical os.  Vagina was clean and well epithelialized.  Pap smear was  taken at that time.  Uterus is anterior and within the upper limits of  normal size.  Adnexa could not be well outlined due to habitus.  EXTREMITIES:  No edema.  No clubbing.  No cyanosis.  NEUROLOGIC:  Cranial nerves II through XII are grossly intact.  PSYCHIATRIC:  Normal mood.  Normal affect.   ASSESSMENT AND PLAN:  Incomplete abortion.  Based on recent pelvic  ultrasound showing retained products of conception and persistently  elevated beta-hCG of 95, the patient is hemodynamically stable and  afebrile.  We recommend a dilation and curettage.  We will get the  schedule for her for tomorrow.  The risks and benefits of dilation and   curettage were reviewed with the patient and informed consent was  obtained.  We will also give her prescription for Darvocet-N 100 one  p.o. every 4-6 hours as needed for pain, #10 tabs in refills as well as  the prescription for doxycycline 100 mg tab #3, one to be taken on the  morning of surgery and 2 tabs p.o. today after for prophylaxis.      Scheryl Darter, MD    ______________________________  Scheryl Darter, MD    JA/MEDQ  D:  12/16/2008  T:  12/17/2008  Job:  [id number]

## 2010-12-05 NOTE — Letter (Signed)
April 04, 2007    Loreen Freud, MD  [phone] [place], [place] [address]   RE:  [name], [name]  MRN:  [id number]  /  DOB:  05/03/1981   Dear Dr. Laury Axon:   Upon your kind referral, I had the pleasure of evaluating your patient  and I am pleased to offer my findings.  I saw Ruth [name] in the  office today.  Enclosed is a copy of my progress note that details my  findings and recommendations.   Thank you for the opportunity to participate in your patient's care.    Sincerely,      Ruth Hair. Arlyce Dice, MD,FACG  Electronically Signed    RDK/MedQ  DD: 04/04/2007  DT: 04/05/2007  Job #: [id number]

## 2010-12-05 NOTE — Group Therapy Note (Signed)
Ruth [name], Ruth [name]               ACCOUNT NO.:  [id number]   MEDICAL RECORD NO.:  [id number]          PATIENT TYPE:  WOC   LOCATION:  [facility]                   FACILITY:  [facility]   PHYSICIAN:  Argentina Donovan, MD        DATE OF BIRTH:  Aug 23, 1980   DATE OF SERVICE:  12/15/2008                                  CLINIC NOTE   The patient is 30 year old gravida 5, para 3-0-2-3 who went in to the  maternity admissions on May 2, was found to have a 7-week failed  pregnancy and was treated with Cytotec for the next 4 days.  She said  she bleed very heavy, passed lots of clots, but she has continued to  bleed on and off slightly and sometimes a little heavier.  She developed  a left-sided headache.  She usually does not have headaches but this one  persisted and had not been relieved by the over-the-counter medication  she has been taking.  She also complained about her face breaking out  when she said that does not usually happen.  She desires Depo-Provera  for birth control.  Had not had a Pap smear in some time.   EXAMINATION:  ABDOMEN:  Soft, flat, nontender.  No masses, no  organomegaly.  External genitalia is normal.  BUS within normal limits.  Vagina is clean, well epithelialized.  Pap smear was taken.  There was  very little spotting from the cervical os.  The uterus is anterior.  Upper limits of normal in size.  Adnexa could not be well outlined  because of habitus of the patient.  Blood pressure today is 101/70, pulse 73.  The patient is 170 pounds and  is 5 feet 6 inches tall.   IMPRESSION:  1. This is a complete verses incomplete abortion.  2. Left-sided headache extending from the occiput to the frontal area      of unknown etiology.   PLAN:  I am going to repeat her quantitative beta hCG today and then  schedule for an ultrasound either today or tomorrow morning.  Have her  return tomorrow for the GYN clinic to determine whether she can get her  shot of Depo-Provera or does  she need a dilation and curettage to  complete this problem if she had an incomplete abortion.  CBC will be  repeated to evaluate how much blood loss she had during her miscarriage.           ______________________________  Argentina Donovan, MD     PR/MEDQ  D:  12/15/2008  T:  12/15/2008  Job:  [id number]

## 2010-12-05 NOTE — Assessment & Plan Note (Signed)
[facility]                         GASTROENTEROLOGY OFFICE NOTE   Ruth [name], Ruth [name]                      MRN:          [id number]  DATE:05/14/2007                            DOB:          [birth year]    PROBLEM:  Abdominal pain.   Ruth [name] has returned for scheduled follow-up.  Upper endoscopy was  pertinent for nonerosive esophagitis.  She underwent a 48-hour Bravo pH  probe study.  On day #1, she had significant acid reflux as demonstrated  by a total DeMeester score of 16.4 (normal less than 14.72).  On day #2,  DeMeester score was 4.8, indicated an insignificant acid reflux.  Ruth [name] took her Zegerid on day #2 but not on day #1.  Interestingly, she  complained of symptoms on day #2 and symptoms ever since consisting of  sweats followed by burning pain in her mid and right upper quadrant that  radiates to her lower chest.  She has taken antacids without relief.  Symptoms continue.  She claims the only medicine that helped her was  Protonix.  She has had a previous abdominal ultrasound that apparently  was unremarkable.   PHYSICAL EXAMINATION:  VITAL SIGNS:  Pulse 56, blood pressure 102/66,  weight 179.   IMPRESSION:  Abdominal and chest pain.  At this point, I do not think  this is due to reflux.  She does have significant acid reflux but she is  symptomatic in the face of acid suppression.  Chronic acalculous  cholecystitis is a possibility.   RECOMMENDATIONS:  1. Continue Zegerid.  2. Hyoscyamine 0.25 mg sublingual p.r.n.  3. Hepatobiliary scan.     Barbette Hair. Arlyce Dice, MD,FACG  Electronically Signed    RDK/MedQ  DD: 05/14/2007  DT: 05/15/2007  Job #: [id number]   cc:   Lelon Perla, DO

## 2010-12-05 NOTE — Group Therapy Note (Signed)
Ruth [name], Ruth [name]               ACCOUNT NO.:  [id number]   MEDICAL RECORD NO.:  [id number]          PATIENT TYPE:  WOC   LOCATION:  [facility]                   FACILITY:  [facility]   PHYSICIAN:  Jaynie Collins, MD     DATE OF BIRTH:  01/19/1981   DATE OF SERVICE:  01/14/2009                                  CLINIC NOTE   The patient is a 30 year old gravida 5, para 3-0-2-3, status post  dilation and curettage in the setting of an incomplete abortion on Dec 17, 2008.  The pathology from the dilation and curettage came back  positive for chorionic villi.  The patient reports having minimal  bleeding since the procedure and has not had menstrual period since the  procedure.  She has resumed sexual activity without any problems.  The  only concern that she has as of thick-white discharge that she has noted  over the last few days which she wants to be evaluated for.  The patient  uses condoms for contraception.  She is not interested in any other  forms of contraception and is interested in trying again for another  pregnancy.  Of note on Dec 17, 2008, the patient also had a Pap smear  done which was low-grade squamous intraepithelial lesion.  The patient  is currently booked for colposcopy on February 10, 2009. and this  information was communicated to the patient.   PHYSICAL EXAMINATION:  VITAL SIGNS:  Temperature 97.6, pulse 80, blood  pressure 112/71, weight 170.81 pounds, and height 5 feet 6 inches.  GENERAL:  No apparent distress.  ABDOMEN:  Soft, nontender, nondistended.  PELVIC:  Normal external female genitalia.  Pink and well rugated  vagina.  Thick white discharge, sample was taken for wet prep.  Nulliparous.  Cervical os noted.  No abnormal discharge noted.  Normal  contour.  On bimanual examination, her uterus is small and nontender,  nontender adnexa.   ASSESSMENT AND PLAN:  The patient is a 30 year old gravida 5, para 3-0-2-  3, who was here for postoperative check after D  and C.  The patient is  doing well.  She was told to continue to use condoms as stated for  contraception, but also she should start taking on daily Multivite,  given that she is also going to try to conceive in a short period of  time.  The patient was also told about the importance of following up  her colposcopy examination.           ______________________________  Jaynie Collins, MD     UA/MEDQ  D:  01/14/2009  T:  01/14/2009  Job:  [id number]

## 2010-12-05 NOTE — Assessment & Plan Note (Signed)
 [facility]                         GASTROENTEROLOGY OFFICE NOTE   Ruth [name], Ruth [name]                      MRN:          [id number]  DATE:04/04/2007                            DOB:          1980-10-01    REASON FOR CONSULTATION:  Abdominal pain.   HISTORY OF PRESENT ILLNESS:  Ruth [name] is a 30 year old African American  female referred through the courtesy of Dr. Laury Axon for evaluation.  For  several months, she has been complaining of burning with mid-epigastric  pain.  Burning will radiate into her chest.  She may have minimal  dysphagia occasionally to solids.  She has taken various proton pump  inhibitors including Zegerid, Nexium, Protonix and Prevacid without  improvement.  She has undergone an ultrasound, and apparently it was  negative.  She is on gastric irritants including nonsteroidals.  Pain is  without radiation.  She denies change of bowel habits.  She does note  that symptoms worsen with tension and anxiety.   PAST MEDICAL HISTORY:  Pertinent for kidney stones.   FAMILY HISTORY:  Noncontributory.   Currently she is on no medications.   She has no allergies.   She smokes occasionally.  She does not drink.  She is single and works  at [employer].   REVIEW OF SYSTEMS:  Positive for night sweats.   PHYSICAL EXAMINATION:  She is a healthy-appearing female.  Pulse 92,  blood pressure 110/64, weight 180.  HEENT: EOMI.  PERRLA.  Sclerae are anicteric.  Conjunctivae are pink.  NECK:  Supple without thyromegaly, adenopathy or carotid bruits.  CHEST:  Clear to auscultation and percussion without adventitious  sounds.  CARDIAC:  Regular rhythm; normal S1 S2.  There are no murmurs, gallops  or rubs.  ABDOMEN:  Bowel sounds are normoactive.  Abdomen is soft, nontender and  nondistended.  There are no abdominal masses, tenderness, splenic  enlargement or hepatomegaly.  EXTREMITIES:  Full range of motion.  No cyanosis, clubbing or edema.   RECTAL:  Deferred.   IMPRESSION:  Persistent upper abdominal pain.  Symptoms certainly are  suggestive of gastroesophageal reflux, though her absence to response to  medicines is very atypical.  There if no evidence for cholelithiasis.  Nonspecific dyspepsia is a possibility.   RECOMMENDATIONS:  1. A trial of NuLev 0.25 milligrams sublingual as needed.  2. Upper endoscopy.  3. To consider 24-hour pH probe if symptoms are no better and      endoscopy is negative.     Barbette Hair. Arlyce Dice, MD,FACG  Electronically Signed    RDK/MedQ  DD: 04/04/2007  DT: 04/05/2007  Job #: [id number]   cc:   Lelon Perla, DO

## 2010-12-05 NOTE — Op Note (Signed)
NAMEZULEYKA, Ruth [name]               ACCOUNT NO.:  [id number]   MEDICAL RECORD NO.:  [id number]         PATIENT TYPE:  LAMB   LOCATION:                               FACILITY:  [facility]   PHYSICIAN:  Barbette Hair. Arlyce Dice, MD,FACGDATE OF BIRTH:  06-18-1981   DATE OF PROCEDURE:  04/11/2007  DATE OF DISCHARGE:                               OPERATIVE REPORT   TWENTY-FOUR HOUR PH PROBE TEST:  Test was performed utilizing the Bravo pH probe.  This was done with  patient on her PPI therapy.   On day 1, there were 51 episodes of reflux, 43 in the upright position  and 37 postprandially.  Total time ______pH____  less than 4 was 51  minutes, 33 in the upright position, and 17 in the supine position.  Fractional time pH less than 4 was 3.7%.  Total Demetra score 51 was  16.4 with normal less than 14.72.   On day 2, there were 24 episodes of reflux.  Total pH time less than 4  was 12 minutes, 5 in the upright and 7 in the supine position.  Fractional time with pH less than 4 was 0.9%.  Total Demetra score was  4.8%.   Test demonstrated significant acid reflux on day 1, but not on day 2.   Results will be reviewed with the patient.  At issue is whether or not  she was taking her medicines throughout the test since the results were  significantly variable from day 1 to day 2.      Barbette Hair. Arlyce Dice, MD,FACG  Electronically Signed     RDK/MEDQ  D:  04/16/2007  T:  04/17/2007  Job:  [id number]

## 2010-12-05 NOTE — Assessment & Plan Note (Signed)
[facility]                                 ON-CALL NOTE   SALOME, HAUTALA                      MRN:          [id number]  DATE:04/13/2007                            DOB:          24-Mar-1981    TIME:  Two p.m.   PHONE NUMBER:  Is [phone].   PHYSICIAN:  Barbette Hair. Arlyce Dice, MD,FACG.   Ms. Linney called this afternoon stating she has problems with the Bravo  and monitoring system.  She notes that the recorder is no longer  recording and she is concerned that it has malfunctioned.  She relates  that it was working over the past 2 days and it just stopped working  recently.  It has been over 48 hours since she had the Bravo capsule  placed by Dr. Arlyce Dice.  The endoscopy with capsule placement was done  about 1 p.m. on Friday.  I advised her that she should bring the  monitoring device in on Monday as planned.  She appeared quite  frustrated that it had possibly malfunctioned and I told her I was not  sure it had malfunctioned and tt may just stop recording at 48 hours and  all the data may still be available.  She still seemed quite frustrated  and then complained she had problems eating and drinking for the past 2  days.  She states that it is difficult to swallow because she feels a  sensation like as a pill stuck in her throat every time she swallows.  She is able to swallow.  She states she has not eaten much or had much  to drink because of this sensation however she is able to drink and eat.  She has no chest pain, fevers, chills, nausea, or vomiting.  She states  that she is starting to feel weak and dizziness because she has not had  much to eat or drink.  I advised her to push fluids and drink as much as  she cane over the next 24 hours.  If she still has problems, I advised  her that the Bravo capsule could be removed and she could follow up  tomorrow with Dr. Arlyce Dice.  She stated that was not adequate and she  would like something done  today.  I offered to see her in the emergency  room to evaluate her, give her IV fluids and to possibly proceed with an  endoscopy to remove the Bravo probe and she was not pleased with that  offer.  I advised her she could come in directly for an endoscopy this  afternoon to remove the probe and examine the esophagus.  She asked me  then if we yank the probe out and I said well, yes and then she called  me unprofessional for agreeing with the term yank.  I apologized for  that and I advised her I was just trying to explain to her that we, of  course, carefully remove the probe but I was just going along with the  terminology she was using top help her understand the procedure  and then  she hung up the phone.  I advised the answering service to please notify  me if she calls back and we will try to assist her with her medical  problems, otherwise she can speak to Dr. Arlyce Dice on Monday when the  office opens after 8 am. I spoke with the answering service 2 or 3 times  after this call about this patient. They spoke with her and she stated  she would sue me because of my unprofessional language - using the work  yank. The patient was argumentative, abrupt and frustrated with both me  and the answering service staff. The answering service  advised me that the patient did not want me to call her back. She would  call back if she wanted me.     Venita Lick. Russella Dar, MD, [facility]  Electronically Signed    MTS/MedQ  DD: 04/13/2007  DT: 04/13/2007  Job #: [id number]   cc:   Barbette Hair. Arlyce Dice, MD,FACG

## 2010-12-05 NOTE — Op Note (Signed)
Ruth [name], Ruth [name]               ACCOUNT NO.:  [id number]   MEDICAL RECORD NO.:  [id number]         PATIENT TYPE:  WAMB   LOCATION:                                FACILITY:  [facility]   PHYSICIAN:  Scheryl Darter, MD       DATE OF BIRTH:  [birth year]   DATE OF PROCEDURE:  DATE OF DISCHARGE:                               OPERATIVE REPORT   PROCEDURE:  Suction dilatation and curettage.   PREOPERATIVE DIAGNOSIS:  Incomplete abortion.   POSTOPERATIVE DIAGNOSIS:  Incomplete abortion.   SURGEON:  Scheryl Darter, MD.   ANESTHESIA:  General with intracervical block.   BLOOD LOSS:  Minimal.   SPECIMENS:  Products of conception.   COMPLICATIONS:  None.   DRAINS:  None.   COUNTS:  Correct.   OPERATIVE COURSE:  The patient gave written consent for suction  dilatation and curettage.  The patient had been given Cytotec on May 2  for a missed abortion, but ultrasound on May 26 showed heterogenous mass  in endometrial cavity consistent with retained products of conception.  She had a beta-hCG of 95.  She gave consent for suction dilatation and  curettage.  The patient's identification was confirmed.  She was brought  to the OR and general anesthesia was induced.  She was placed in dorsal  lithotomy position and the perineum and the vagina were sterilely  prepped and draped.  Bladder was drained with a red rubber catheter.  Exam reveals fairly normal-sized uterus.  Cervix was less than 1 cm  open.  A 1% lidocaine was infiltrated as an intracervical block and the  cervix was grasped with single-tooth tenaculum.  Uterus sound to 9 cm.  Cervix was dilated and sufficiently and passed a 9-mm suction curette.  Suction curettage was done and complete evacuation of the uterine cavity  was assured.  There was minimal bleeding.  Specimen was sent to  Pathology.  All instruments were removed.  The patient tolerated the  procedure well without complications.  She was brought in stable  condition to the  recovery room.      Scheryl Darter, MD  Electronically Signed    JA/MEDQ  D:  12/17/2008  T:  12/18/2008  Job:  [id number]

## 2010-12-08 NOTE — Assessment & Plan Note (Signed)
[facility]                                 ON-CALL NOTE   Ruth [name], Ruth [name]                        MRN:          [id number]  DATE:04/12/2007                            DOB:          03/18/1981    PHONE NUMBER:  [phone].   PRIMARY:  Dr. Laury Axon.   SUBJECTIVE:  Ruth [name] had a PEG tube placed here recently, or what  sounds like, possibly, a PEG tube, by Dr. Arlyce Dice.  She is requesting a  work excuse note.   ASSESSMENT AND PLAN:  Suggested calling Dr. Marzetta Board office first thing  Monday morning.     Kerby Nora, MD  Electronically Signed    AB/MedQ  DD: 04/12/2007  DT: 04/12/2007  Job #: [id number]

## 2010-12-15 ENCOUNTER — Ambulatory Visit (INDEPENDENT_AMBULATORY_CARE_PROVIDER_SITE_OTHER): Payer: Medicaid Other | Admitting: *Deleted

## 2010-12-15 DIAGNOSIS — Z309 Encounter for contraceptive management, unspecified: Secondary | ICD-10-CM

## 2010-12-15 MED ORDER — MEDROXYPROGESTERONE ACETATE 150 MG/ML IM SUSP
150.0000 mg | Freq: Once | INTRAMUSCULAR | Status: AC
  Administered 2010-12-15: 150 mg via INTRAMUSCULAR

## 2011-02-08 ENCOUNTER — Telehealth: Payer: Self-pay | Admitting: Family Medicine

## 2011-02-08 DIAGNOSIS — R7611 Nonspecific reaction to tuberculin skin test without active tuberculosis: Secondary | ICD-10-CM | POA: Insufficient documentation

## 2011-02-08 NOTE — Telephone Encounter (Signed)
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  Will forward to MD for order or OV Adrionna Delcid, Maryjo [place]

## 2011-02-08 NOTE — Telephone Encounter (Signed)
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  pls call to let her know when it is scheduled.

## 2011-02-08 NOTE — Telephone Encounter (Signed)
Pt informed and agreeable to go to [facility] or [facility] [place], Maryjo [place]

## 2011-02-08 NOTE — Telephone Encounter (Signed)
Order placed for CXR.  Called patient. Informed that I am placing order now and office will call her with details regarding CXR.

## 2011-03-06 ENCOUNTER — Ambulatory Visit (INDEPENDENT_AMBULATORY_CARE_PROVIDER_SITE_OTHER): Payer: Medicaid Other | Admitting: *Deleted

## 2011-03-06 DIAGNOSIS — Z309 Encounter for contraceptive management, unspecified: Secondary | ICD-10-CM

## 2011-03-06 MED ORDER — MEDROXYPROGESTERONE ACETATE 150 MG/ML IM SUSP: 150.0000 mg | INTRAMUSCULAR | Status: DC

## 2011-03-06 MED ORDER — MEDROXYPROGESTERONE ACETATE 150 MG/ML IM SUSP
150.0000 mg | Freq: Once | INTRAMUSCULAR | Status: AC
  Administered 2011-03-06: 150 mg via INTRAMUSCULAR

## 2011-03-21 ENCOUNTER — Telehealth: Payer: Self-pay | Admitting: Family Medicine

## 2011-03-21 NOTE — Telephone Encounter (Signed)
Spoke with mom today.  She told me again that she felt i was rude that day and that if she says that she does not want something done (HPV shot), I should not ask her why and that doing so is disrespectful. Also, when I asked her son if he wanted her to step out, that was also disrespectful.   I apologized that i was not able to communicate effectively with her that day.  I told her I could not go back and change it, but I could offer some solutions for going forward.  I offered to sign her physical form which she did not have time to bring. i offered to give her the fax number and fax it back to his school.  She says that he missed the first day of try outs so will not be playing sports until the spring.  She will keep her appt with Dr. Madolyn Frieze.  I apologized again and said that I hoped that if we interacted again in clinic, I would do a better job.  She said that she hoped so to, that she appreciated my call, and that she had prayed about it and left it in gods hands.

## 2011-03-21 NOTE — Telephone Encounter (Signed)
Ruth [name] returning call made to her earlier.  Need to speak with Dr. Hulen Luster asap.

## 2011-04-12 LAB — RAPID URINE DRUG SCREEN, HOSP PERFORMED
Barbiturates: NOT DETECTED
Cocaine: NOT DETECTED

## 2011-04-12 LAB — URINALYSIS, ROUTINE W REFLEX MICROSCOPIC
Ketones, ur: NEGATIVE
Nitrite: NEGATIVE
pH: 7.5

## 2011-04-17 ENCOUNTER — Ambulatory Visit (INDEPENDENT_AMBULATORY_CARE_PROVIDER_SITE_OTHER): Payer: Medicaid Other | Admitting: *Deleted

## 2011-04-17 DIAGNOSIS — Z23 Encounter for immunization: Secondary | ICD-10-CM

## 2011-05-03 LAB — URINALYSIS, ROUTINE W REFLEX MICROSCOPIC
Glucose, UA: NEGATIVE
Ketones, ur: NEGATIVE
pH: 6

## 2011-05-03 LAB — COMPREHENSIVE METABOLIC PANEL
ALT: 41 — ABNORMAL HIGH
AST: 30
CO2: 26
Chloride: 106
GFR calc Af Amer: >60
GFR calc non Af Amer: >60
Sodium: 139
Total Bilirubin: 0.5

## 2011-05-03 LAB — DIFFERENTIAL
Basophils Absolute: 0
Eosinophils Absolute: 0.1
Eosinophils Relative: 1

## 2011-05-03 LAB — CBC
RBC: 4.66
WBC: 5.9

## 2011-05-03 LAB — POCT PREGNANCY, URINE: Preg Test, Ur: NEGATIVE

## 2011-05-03 LAB — LIPASE, BLOOD: Lipase: 25

## 2011-05-13 ENCOUNTER — Emergency Department (HOSPITAL_COMMUNITY): Payer: Medicaid Other

## 2011-05-13 ENCOUNTER — Emergency Department (HOSPITAL_COMMUNITY)
Admission: EM | Admit: 2011-05-13 | Discharge: 2011-05-13 | Disposition: A | Discharge status: Home / Self Care | Payer: Medicaid Other | Attending: Emergency Medicine | Admitting: Emergency Medicine

## 2011-05-13 DIAGNOSIS — K219 Gastro-esophageal reflux disease without esophagitis: Secondary | ICD-10-CM | POA: Insufficient documentation

## 2011-05-13 DIAGNOSIS — R071 Chest pain on breathing: Secondary | ICD-10-CM | POA: Insufficient documentation

## 2011-05-13 LAB — POCT I-STAT, CHEM 8
BUN: 6 mg/dL (ref 6–23)
Calcium, Ion: 1.2 mmol/L (ref 1.12–1.32)
TCO2: 24 mmol/L (ref 0–100)

## 2011-05-13 LAB — POCT I-STAT TROPONIN I: Troponin i, poc: 0.02 ng/mL (ref 0.00–0.08)

## 2011-05-13 MED ORDER — IOHEXOL 300 MG/ML  SOLN
100.0000 mL | Freq: Once | INTRAMUSCULAR | Status: AC | PRN
  Administered 2011-05-13: 100 mL via INTRAVENOUS

## 2011-05-30 ENCOUNTER — Telehealth: Payer: Self-pay | Admitting: Family Medicine

## 2011-05-30 DIAGNOSIS — R7611 Nonspecific reaction to tuberculin skin test without active tuberculosis: Secondary | ICD-10-CM

## 2011-05-30 NOTE — Telephone Encounter (Signed)
Please inform Ms. Mishkin she may go to [facility] or [facility] for this CXR.

## 2011-05-30 NOTE — Telephone Encounter (Signed)
Ms. Ruth [name] need order to have another xray for her employer for tb screening

## 2011-06-08 ENCOUNTER — Ambulatory Visit (HOSPITAL_COMMUNITY)
Admission: RE | Admit: 2011-06-08 | Discharge: 2011-06-08 | Disposition: A | Discharge status: Home / Self Care | Payer: Medicaid Other | Source: Ambulatory Visit | Attending: Family Medicine | Admitting: Family Medicine

## 2011-06-08 DIAGNOSIS — R7611 Nonspecific reaction to tuberculin skin test without active tuberculosis: Secondary | ICD-10-CM | POA: Insufficient documentation

## 2011-06-20 ENCOUNTER — Ambulatory Visit (INDEPENDENT_AMBULATORY_CARE_PROVIDER_SITE_OTHER): Payer: Medicaid Other | Admitting: *Deleted

## 2011-06-20 DIAGNOSIS — Z309 Encounter for contraceptive management, unspecified: Secondary | ICD-10-CM

## 2011-06-20 MED ORDER — MEDROXYPROGESTERONE ACETATE 150 MG/ML IM SUSP: 150.0000 mg | INTRAMUSCULAR | Status: DC

## 2011-06-20 MED ORDER — MEDROXYPROGESTERONE ACETATE 150 MG/ML IM SUSP
150.0000 mg | Freq: Once | INTRAMUSCULAR | Status: AC
  Administered 2011-06-20: 150 mg via INTRAMUSCULAR

## 2011-06-20 NOTE — Progress Notes (Signed)
Patient late for depo today. Urine pregnancy test performed and is negative. Patient reports last sexual activity more than 2 weeks ago. . Advised to use extra protection for next 7 days.

## 2011-07-09 ENCOUNTER — Encounter: Payer: Self-pay | Admitting: Family Medicine

## 2011-07-09 ENCOUNTER — Ambulatory Visit (INDEPENDENT_AMBULATORY_CARE_PROVIDER_SITE_OTHER): Payer: Medicaid Other | Admitting: Family Medicine

## 2011-07-09 ENCOUNTER — Emergency Department (INDEPENDENT_AMBULATORY_CARE_PROVIDER_SITE_OTHER)
Admission: EM | Admit: 2011-07-09 | Discharge: 2011-07-09 | Discharge status: Against Medical Advice | Payer: Medicaid Other | Source: Home / Self Care | Attending: Emergency Medicine | Admitting: Emergency Medicine

## 2011-07-09 VITALS — BP 114/72 | HR 64 | Temp 98.1°F | Ht 66.0 in | Wt 183.0 lb

## 2011-07-09 DIAGNOSIS — M25562 Pain in left knee: Secondary | ICD-10-CM

## 2011-07-09 DIAGNOSIS — M25569 Pain in unspecified knee: Secondary | ICD-10-CM

## 2011-07-09 MED ORDER — KNEE BRACE/HINGED BARS MEDIUM MISC: 1.0000 | Freq: Every day | Status: DC

## 2011-07-09 NOTE — Assessment & Plan Note (Signed)
Likely lateral meniscus injury but no mechanical symptoms.   Cortisone injected today.  We'll provide a hinged knee brace and physical therapy for strengthening of leg. Will followup with me in 2 months or sooner if needed. Handout on meniscus rehabilitation given the patient.

## 2011-07-09 NOTE — ED Notes (Addendum)
I explained to Pt that the MD had ordered Crutches and a knee sleeve. Per Pt, "I have a knee sleeve at home and I don't want crutches." Pt stated that she was leaving without receiving discharge papers. MD & RN were notified that Pt left [facility] without wanting further medical care.

## 2011-07-09 NOTE — Progress Notes (Signed)
Ruth [name] is a 30 year old woman with a history of left knee injury in high school.  She had what seems to be a left knee meniscus injury while playing volleyball.  She has had off and on knee pain over the last 13 years.  However in the last 4 days she has noted increased knee swelling and pain.  She notes some decrease in her range of motion as well. She denies any locking or catching but does hear clicking and clunks in her knee.  She denies any lower extremity numbness or weakness.  She notes that she recently has started a job as a Lawyer and is doing much more bending stooping and kneeling than previously was normal.  PMH reviewed.  ROS as above otherwise neg Medications reviewed. Current Outpatient Prescriptions  Medication Sig Dispense Refill  . Elastic Bandages & Supports (KNEE BRACE/HINGED BARS MEDIUM) MISC 1 Device by Does not apply route daily.  1 each  0    Exam:  BP 114/72  Pulse 64  Temp(Src) 98.1 F (36.7 C) (Oral)  Ht 5\' 6"  (1.676 m)  Wt 183 lb (83.008 kg)  BMI 29.54 kg/m2 Gen: Well NAD MSK: mild left knee effusion with no erythema or warmth.  Decreased vastus medialis bulk on left compared to right.  Decreased range of motion to extension and flexion.  Lateral joint line tenderness to palpation.  Negative Lachman's and valgus and varus stress.  Positive McMurray's on the lateral meniscus.    Procedure note: Consent obtained and timeout performed. He cleaned with Betadine and alcohol.  Cold spray applied.  Then using a 25-gauge 1.5 inch needle 40 mg of Kenalog and 4 mL of 0.25% bupivacaine were injected into the knee via the bent knee approach in the medial joint space.  Ms. Skipper tolerated the procedure well with no bleeding.

## 2011-07-09 NOTE — Patient Instructions (Signed)
Thank you for coming in today. Wear the knee brace.  We will call you with you PT appointment.  Come see me in 2 months.  Use 600mg  of ibuporfen every 8 hours.   Meniscus Tear with Phase I Rehab The meniscus is a C-shaped cartilage structure, located in the knee joint between the thigh bone (femur) and the shinbone (tibia). Two menisci are located in each knee joint: the inner and outer meniscus. The meniscus acts as an adapter between the thigh bone and shinbone, allowing them to fit properly together. It also functions as a shock absorber, to reduce the stress placed on the knee joint and to help supply nutrients to the knee joint cartilage. As people age, the meniscus begins to harden and become more vulnerable to injury. Meniscus tears are a common injury, especially in older athletes. Inner meniscus tears are more common than outer meniscus tears.   SYMPTOMS    Pain in the knee, especially with standing or squatting with the affected leg.     Tenderness along the joint line.     Swelling in the knee joint (effusion), usually starting 1 to 2 days after injury.     Locking or catching of the knee joint, causing inability to straighten the knee completely.     Giving way or buckling of the knee.  CAUSES   A meniscus tear occurs when a force is placed on the meniscus that is greater than it can handle. Common causes of injury include:  Direct hit (trauma) to the knee.     Twisting, pivoting, or cutting (rapidly changing direction while running), kneeling or squatting.     Without injury, due to aging.  RISK INCREASES WITH:  Contact sports (football, rugby).     Sports in which cleats are used with pivoting (soccer, lacrosse) or sports in which good shoe grip and sudden change in direction are required (racquetball, basketball, squash).     Previous knee injury.     Associated knee injury, particularly ligament injuries.     Poor strength and flexibility.  PREVENTION  Warm up  and stretch properly before activity.     Maintain physical fitness:     Strength, flexibility, and endurance.     Cardiovascular fitness.     Protect the knee with a brace or elastic bandage.     Wear properly fitted protective equipment (proper cleats for the surface).  PROGNOSIS   Sometimes, meniscus tears heal on their own. However, definitive treatment requires surgery, followed by at least 6 weeks of recovery.   RELATED COMPLICATIONS    Recurring symptoms that result in a chronic problem.     Repeated knee injury, especially if sports are resumed too soon after injury or surgery.     Progression of the tear (the tear gets larger), if untreated.     Arthritis of the knee in later years (with or without surgery).     Complications of surgery, including infection, bleeding, injury to nerves (numbness, weakness, paralysis) continued pain, giving way, locking, nonhealing of meniscus (if repaired), need for further surgery, and knee stiffness (loss of motion).  TREATMENT   Treatment first involves the use of ice and medicine, to reduce pain and inflammation. You may find using crutches to walk more comfortable. However, it is okay to bear weight on the injured knee, if the pain will allow it. Surgery is often advised as a definitive treatment. Surgery is performed through an incision near the joint (arthroscopically). The torn piece  of the meniscus is removed, and if possible the joint cartilage is repaired. After surgery, the joint must be restrained. After restraint, it is important to perform strengthening and stretching exercises to help regain strength and a full range of motion. These exercises may be completed at home or with a therapist.   MEDICATION  If pain medicine is needed, nonsteroidal anti-inflammatory medicines (aspirin and ibuprofen), or other minor pain relievers (acetaminophen), are often advised.     Do not take pain medicine for 7 days before surgery.      Prescription pain relievers may be given, if your caregiver thinks they are needed. Use only as directed and only as much as you need.  HEAT AND COLD  Cold treatment (icing) should be applied for 10 to 15 minutes every 2 to 3 hours for inflammation and pain, and immediately after activity that aggravates your symptoms. Use ice packs or an ice massage.     Heat treatment may be used before performing stretching and strengthening activities prescribed by your caregiver, physical therapist, or athletic trainer. Use a heat pack or a warm water soak.  SEEK MEDICAL CARE IF:    Symptoms get worse or do not improve in 2 weeks, despite treatment.     New, unexplained symptoms develop. (Drugs used in treatment may produce side effects.)  EXERCISES RANGE OF MOTION (ROM) AND STRETCHING EXERCISES - Meniscus Tear, Non-operative, Phase I These are some of the initial exercises with which you may start your rehabilitation program, until you see your caregiver again or until your symptoms are resolved. Remember:    These initial exercises are intended to be gentle. They will help you restore motion without increasing any swelling.     Completing these exercises allows less painful movement and prepares you for the more aggressive strengthening exercises in Phase II.     An effective stretch should be held for at least 30 seconds.     A stretch should never be painful. You should only feel a gentle lengthening or release in the stretched tissue.  RANGE OF MOTION - Knee Flexion, Active  Lie on your back with both knees straight. (If this causes back discomfort, bend your healthy knee, placing your foot flat on the floor.)     Slowly slide your heel back toward your buttocks until you feel a gentle stretch in the front of your knee or thigh.     Hold for __________ seconds. Slowly slide your heel back to the starting position.  Repeat __________ times. Complete this exercise __________ times per day.    RANGE OF MOTION - Knee Flexion and Extension, Active-Assisted  Sit on the edge of a table or chair with your thighs firmly supported. It may be helpful to place a folded towel under the end of your right / left thigh.     Flexion (bending): Place the ankle of your healthy leg on top of the other ankle. Use your healthy leg to gently bend your right / left knee until you feel a mild tension across the top of your knee.     Hold for __________ seconds.     Extension (straightening): Switch your ankles so your right / left leg is on top. Use your healthy leg to straighten your right / left knee until you feel a mild tension on the backside of your knee.     Hold for __________ seconds.  Repeat __________ times. Complete __________ times per day. STRETCH - Knee Flexion, Supine  Lie on the floor with your right / left heel and foot lightly touching the wall. (Place both feet on the wall if you do not use a door frame.)     Without using any effort, allow gravity to slide your foot down the wall slowly until you feel a gentle stretch in the front of your right / left knee.     Hold this stretch for __________ seconds. Then return the leg to the starting position, using your healthy leg for help, if needed.  Repeat __________ times. Complete this stretch __________ times per day.   STRETCH - Knee Extension Sitting  Sit with your right / left leg/heel propped on another chair, coffee table, or foot stool.     Allow your leg muscles to relax, letting gravity straighten out your knee.*     You should feel a stretch behind your right / left knee. Hold this position for __________ seconds.  Repeat __________ times. Complete this stretch __________ times per day.   *Your physician, physical therapist or athletic trainer may instruct you place a __________ weight on your thigh, just above your kneecap, to deepen the stretch.   STRENGTHENING EXERCISES - Meniscus Tear, Non-operative, Phase I These  exercises may help you when beginning to rehabilitate your injury. They may resolve your symptoms with or without further involvement from your physician, physical therapist or athletic trainer. While completing these exercises, remember:    Muscles can gain both the endurance and the strength needed for everyday activities through controlled exercises.     Complete these exercises as instructed by your physician, physical therapist or athletic trainer. Progress the resistance and repetitions only as guided.  STRENGTH - Quadriceps, Isometrics  Lie on your back with your right / left leg extended and your opposite knee bent.     Gradually tense the muscles in the front of your right / left thigh. You should see either your knee cap slide up toward your hip or increased dimpling just above the knee. This motion will push the back of the knee down toward the floor, mat, or bed on which you are lying.     Hold the muscle as tight as you can, without increasing your pain, for __________ seconds.     Relax the muscles slowly and completely between each repetition.  Repeat __________ times. Complete this exercise __________ times per day.   STRENGTH - Quadriceps, Short Arcs   Lie on your back. Place a __________ inch towel roll under your right / left knee, so that the knee bends slightly.     Raise only your lower leg by tightening the muscles in the front of your thigh. Do not allow your thigh to rise.     Hold this position for __________ seconds.  Repeat __________ times. Complete this exercise __________ times per day.   OPTIONAL ANKLE WEIGHTS: Begin with ____________________, but DO NOT exceed ____________________. Increase in 1 pound/0.5 kilogram increments. STRENGTH - Quadriceps, Straight Leg Raises  Quality counts! Watch for signs that the quadriceps muscle is working, to be sure you are strengthening the correct muscles and not "cheating" by substituting with healthier muscles.  Lay on  your back with your right / left leg extended and your opposite knee bent.     Tense the muscles in the front of your right / left thigh. You should see either your knee cap slide up or increased dimpling just above the knee. Your thigh may even shake a bit.  Tighten these muscles even more and raise your leg 4 to 6 inches off the floor. Hold for __________ seconds.     Keeping these muscles tense, lower your leg.     Relax the muscles slowly and completely in between each repetition.  Repeat __________ times. Complete this exercise __________ times per day.   STRENGTH - Hamstring, Curls   Lay on your stomach with your legs extended. (If you lay on a bed, your feet may hang over the edge.)     Tighten the muscles in the back of your thigh to bend your right / left knee up to 90 degrees. Keep your hips flat on the bed.     Hold this position for __________ seconds.     Slowly lower your leg back to the starting position.  Repeat __________ times. Complete this exercise __________ times per day.   STRENGTH - Quadriceps, Squats  Stand in a door frame so that your feet and knees are in line with the frame.     Use your hands for balance, not support, on the frame.     Slowly lower your weight, bending at the hips and knees. Keep your lower legs upright so that they are parallel with the door frame. Squat only within the range that does not increase your knee pain. Never let your hips drop below your knees.     Slowly return upright, pushing with your legs, not pulling with your hands.  Repeat __________ times. Complete this exercise __________ times per day.   STRENGTH - Quad/VMO, Isometric   Sit in a chair with your right / left knee slightly bent. With your fingertips, feel the VMO muscle just above the inside of your knee. The VMO is important in controlling the position of your kneecap.     Keeping your fingertips on this muscle. Without actually moving your leg, attempt to drive  your knee down as if straightening your leg. You should feel your VMO tense. If you have a difficult time, you may wish to try the same exercise on your healthy knee first.     Tense this muscle as hard as you can without increasing any knee pain.     Hold for __________ seconds. Relax the muscles slowly and completely in between each repetition.  Repeat __________ times. Complete exercise __________ times per day.   Document Released: 07/23/1998 Document Revised: 03/21/2011 Document Reviewed: 10/21/2008 [facility] Patient Information 2012 [place], [place].Meniscus Tear with Phase II Rehab The meniscus is a C-shaped cartilage structure, located in the knee joint between the thigh bone (femur) and the shinbone (tibia). Two menisci are located in each knee joint: the inner and outer meniscus. The meniscus acts as an adapter between the thigh bone and shinbone, allowing them to fit properly together. It also functions as a shock absorber, to reduce the stress placed on the knee joint and to help supply nutrients to the knee joint cartilage. As people age, the meniscus begins to harden and become more vulnerable to injury. Meniscus tears are a common injury, especially in older athletes. Inner meniscus tears are more common than outer meniscus tears.   SYMPTOMS    Pain in the knee, especially with standing or squatting with the affected leg.     Tenderness along the joint line.     Swelling in the knee joint (effusion), usually starting 1 to 2 days after injury.     Locking or catching of the knee joint, causing inability to straighten  the knee completely.     Giving way or buckling of the knee.  CAUSES   A meniscus tear occurs when a force is placed on the meniscus that is greater than it can handle. Common causes of injury include:  Direct hit (trauma) to the knee.     Twisting, pivoting, or cutting (rapidly changing direction while running), kneeling or squatting.     Without injury, due to  aging.  RISK INCREASES WITH:  Contact sports (football, rugby).     Sports in which cleats are used with pivoting (soccer, lacrosse) or sports in which good shoe grip and sudden change in direction are required (racquetball, basketball, squash).     Previous knee injury.     Associated knee injury, particularly ligament injuries.     Poor strength and flexibility.  PREVENTION  Warm up and stretch properly before activity.     Maintain physical fitness:     Strength, flexibility, and endurance.     Cardiovascular fitness.     Protect the knee with a brace or elastic bandage.     Wear properly fitted protective equipment (proper cleats for the surface).  PROGNOSIS   Sometimes, meniscus tears heal on their own. However, definitive treatment requires surgery, followed by at least 6 weeks of recovery.   RELATED COMPLICATIONS    Recurring symptoms that result in a chronic problem.     Repeated knee injury, especially if sports are resumed too soon after injury or surgery.     Progression of the tear (the tear gets larger), if untreated.     Arthritis of the knee in later years (with or without surgery).     Complications of surgery, including infection, bleeding, injury to nerves (numbness, weakness, paralysis) continued pain, giving way, locking, nonhealing of meniscus (if repaired), need for further surgery, and knee stiffness (loss of motion).  TREATMENT   Treatment first involves the use of ice and medicine, to reduce pain and inflammation. You may find using crutches to walk more comfortable. However, it is okay to bear weight on the injured knee, if the pain will allow it. Surgery is often advised as a definitive treatment. Surgery is performed through an incision near the joint (arthroscopically). The torn piece of the meniscus is removed, and if possible the joint cartilage is repaired. After surgery, the joint must be restrained. After restraint, it is important to perform  strengthening and stretching exercises to help regain strength and a full range of motion. These exercises may be completed at home or with a therapist.   MEDICATION    If pain medicine is needed, nonsteroidal anti-inflammatory medicines (aspirin and ibuprofen), or other minor pain relievers (acetaminophen), are often advised.     Do not take pain medicine for 7 days before surgery.     Prescription pain relievers may be given, if your caregiver thinks they are needed. Use only as directed and only as much as you need.  HEAT AND COLD:  Cold treatment (icing) should be applied for 10 to 15 minutes every 2 to 3 hours for inflammation and pain, and immediately after activity that aggravates your symptoms. Use ice packs or an ice massage.     Heat treatment may be used before performing stretching and strengthening activities prescribed by your caregiver, physical therapist, or athletic trainer. Use a heat pack or a warm water soak.  SEEK MEDICAL CARE IF:  Symptoms get worse or do not improve in 2 weeks, despite treatment.  New, unexplained symptoms develop. (Drugs used in treatment may produce side effects.)  EXERCISES RANGE OF MOTION (ROM) AND STRETCHING EXERCISES - Meniscus Tear, Non-operative Phase II After your physician, physical therapist or athletic trainer feels your knee has made progress significant enough to begin more advanced exercises, he or she may recommend some of the exercises that follow. He or she may also advise you to continue with the exercises which you completed in Phase I of your rehabilitation. While completing these exercises, remember:    Restoring tissue flexibility helps normal motion to return to the joints. This allows healthier, less painful movement and activity.     An effective stretch should be held for at least 30 seconds.     A stretch should never be painful. You should only feel a gentle lengthening or release in the stretched tissue.  STRETCH -  Quadriceps, Prone   Lie on your stomach on a firm surface, such as a bed or padded floor.     Bend your right / left knee and grasp your ankle. If you are unable to reach your ankle or pant leg, use a belt around your foot to lengthen your reach.     Gently pull your heel toward your buttocks. Your knee should not slide out to the side. You should feel a stretch in the front of your thigh and knee.     Hold this position for __________ seconds.  Repeat __________ times. Complete this stretch __________ times per day.   STRETCH - Knee Extension, Prone  Lie on your stomach on a firm surface, such as a bed or countertop. Place your right / left knee and leg just beyond the edge of the surface. You may wish to place a towel under the far end of your right / left thigh for comfort.     Relax your leg muscles and allow gravity to straighten your knee. Your caregiver may advise you to add an ankle weight, if more resistance is helpful for you.     You should feel a stretch in the back of your right / left knee. Hold this position for __________ seconds.  Repeat __________ times. Complete this __________ times per day. STRENGTHENING EXERCISES - Meniscus Tear Phase II These are some of the exercises you may progress to in your rehabilitation program. It is critical that you follow the instructions of your caregiver. Based on your individual needs, your caregiver may choose a more or less aggressive approach than the exercises presented. Remember:    Strong muscles with good endurance tolerate stress better.     Do the exercises as initially prescribed by your caregiver. Progress slowly with each exercise, gradually increasing the number of repetitions and weight used under his or her guidance.  STRENGTH - Quadriceps, Short Arcs   Lie on your back. Place a __________ inch towel roll under your right / left knee, so that the knee bends slightly.     Raise only your lower leg by tightening the  muscles in the front of your thigh. Do not allow your thigh to rise.     Hold this position for __________ seconds.  Repeat __________ times. Complete this exercise __________ times per day.   OPTIONAL ANKLE WEIGHTS: Begin with ____________________, but DO NOT exceed ____________________. Increase in 1 pound/0.5 kilogram increments. STRENGTH - Quadriceps, Step-Ups   Use a thick book, step or step stool that is __________ inches tall.     Hold a wall or counter for  balance only, not support.     Slowly step up with your right / left foot, keeping your knee in line with your hip and foot. Do not allow your knee to bend so far that you cannot see your toes.     Slowly unlock your knee and lower yourself to the starting position. Your muscles, not gravity, should lower you.  Repeat __________ times. Complete this exercise __________ times per day.   STRENGTH - Quadriceps, Wall Slides  Follow guidelines for form closely. Increased knee pain often results from poorly placed feet or knees.  Lean against a smooth wall or door and walk your feet out 18-24 inches. Place your feet hip width apart.     Slowly slide down the wall or door until your knees bend __________ degrees.* Keep your knees over your heels, not your toes, and in line with your hips, not falling to either side.     Hold for __________ seconds. Stand up to rest for __________ seconds in between each repetition.  Repeat __________ times. Complete this exercise __________ times per day. * Your physician, physical therapist or athletic trainer will alter this angle based on your symptoms and progress. STRENGTH - Hamstring, Curls  Lay on your stomach with your legs extended. (If you lay on a bed, your feet may hang over the edge.)     Tighten the muscles in the back of your thigh to bend your right / left knee up to 90 degrees. Keep your hips flat on the bed.     Hold this position for __________ seconds.     Slowly lower your leg  back to the starting position.  Repeat __________ times. Complete this exercise __________ times per day.   OPTIONAL ANKLE WEIGHTS: Begin with ____________________, but DO NOT exceed ____________________. Increase in 1 pound/0.5 kilogram increments. Document Released: 10/30/2005 Document Revised: 03/21/2011 Document Reviewed: 10/21/2008 [facility] Patient Information 2012 [place], [place].

## 2011-07-09 NOTE — ED Provider Notes (Signed)
History     CSN: [id number] Arrival date & time: 07/09/2011  8:48 AM   First MD Initiated Contact with Patient 07/09/11 0825      No chief complaint on file.   (Consider location/radiation/quality/duration/timing/severity/associated sxs/prior treatment) HPI Comments: I have played a lot of sports, have had many injuries and knee problems" (unable to specify what injuries). My left knee its hurting and burning", I couldn't wait to see my doctor they don't open until 8:30",,No i have not fallen, or have had any injuries recently that i can tell, it hurts when i walk and when i stretch my knee", I work as a CMA so"!!    " I have been taken, motrin and is not helping" No fevers  "I should have gone to the hospital if you are not going to do anything"  Patient is a 30 y.o. female presenting with knee pain.  Knee Pain This is a recurrent problem. The current episode started more than 2 days ago. The problem occurs constantly. The problem has been gradually worsening. The symptoms are aggravated by bending, walking, twisting and standing. The symptoms are relieved by rest. Treatments tried: Motrin. The treatment provided no relief.    No past medical history on file.  No past surgical history on file.  No family history on file.  History  Substance Use Topics  . Smoking status: Current Everyday Smoker    Types: Cigars  . Smokeless tobacco: Not on file  . Alcohol Use: Not on file    OB History    Grav Para Term Preterm Abortions TAB SAB Ect Mult Living                  Review of Systems  Musculoskeletal: Positive for joint swelling and gait problem.    Allergies  Hydrocodone-acetaminophen  Home Medications  No current outpatient prescriptions on file.  BP 120/71  Pulse 83  Temp(Src) 98 F (36.7 C) (Oral)  Resp 18  SpO2 99%  Physical Exam  Nursing note and vitals reviewed. Constitutional: She appears well-developed and well-nourished.  Musculoskeletal: She  exhibits tenderness.       Left knee: She exhibits decreased range of motion and abnormal patellar mobility. She exhibits no swelling, no effusion, no ecchymosis, no deformity, no erythema, normal alignment, no LCL laxity, no bony tenderness and no MCL laxity. tenderness found. Lateral joint line and LCL tenderness noted.       Legs:   ED Course  Procedures (including critical care time)  Labs Reviewed - No data to display No results found.   1. Knee pain       MDM  Recurrent L knee pain- with exacerbation- No recent traumas- or falls        Jimmie Molly, MD 07/09/11 [phone]

## 2011-09-20 ENCOUNTER — Ambulatory Visit (INDEPENDENT_AMBULATORY_CARE_PROVIDER_SITE_OTHER): Payer: Medicaid Other | Admitting: *Deleted

## 2011-09-20 DIAGNOSIS — Z309 Encounter for contraceptive management, unspecified: Secondary | ICD-10-CM

## 2011-09-20 LAB — POCT URINE PREGNANCY: Preg Test, Ur: NEGATIVE

## 2011-09-20 MED ORDER — MEDROXYPROGESTERONE ACETATE 150 MG/ML IM SUSP
150.0000 mg | Freq: Once | INTRAMUSCULAR | Status: AC
  Administered 2011-09-20: 150 mg via INTRAMUSCULAR

## 2011-09-20 NOTE — Progress Notes (Signed)
Patient late one day for Depo injection. Urine pregnancy test done and is negative.  Patient reports sex within past 5 days so advised that she can receive depo today and  she will need to return in 2 weeks for repeat upreg or we can wait and do second upreg in two weeks and then give depo . She wants to receive today .  Advised extra protection for next 7 days . Plan B offered .   Next depo due May 16  thru Dec 20, 2011

## 2011-10-28 ENCOUNTER — Encounter (HOSPITAL_COMMUNITY): Payer: Self-pay

## 2011-10-28 ENCOUNTER — Emergency Department (HOSPITAL_COMMUNITY)
Admission: EM | Admit: 2011-10-28 | Discharge: 2011-10-28 | Disposition: A | Discharge status: Home / Self Care | Payer: Medicaid Other | Attending: Emergency Medicine | Admitting: Emergency Medicine

## 2011-10-28 ENCOUNTER — Other Ambulatory Visit: Payer: Self-pay

## 2011-10-28 ENCOUNTER — Emergency Department (HOSPITAL_COMMUNITY): Payer: Medicaid Other

## 2011-10-28 DIAGNOSIS — R079 Chest pain, unspecified: Secondary | ICD-10-CM | POA: Insufficient documentation

## 2011-10-28 DIAGNOSIS — R42 Dizziness and giddiness: Secondary | ICD-10-CM | POA: Insufficient documentation

## 2011-10-28 DIAGNOSIS — J45909 Unspecified asthma, uncomplicated: Secondary | ICD-10-CM | POA: Insufficient documentation

## 2011-10-28 DIAGNOSIS — I10 Essential (primary) hypertension: Secondary | ICD-10-CM | POA: Insufficient documentation

## 2011-10-28 DIAGNOSIS — R110 Nausea: Secondary | ICD-10-CM | POA: Insufficient documentation

## 2011-10-28 DIAGNOSIS — K219 Gastro-esophageal reflux disease without esophagitis: Secondary | ICD-10-CM | POA: Insufficient documentation

## 2011-10-28 DIAGNOSIS — R109 Unspecified abdominal pain: Secondary | ICD-10-CM | POA: Insufficient documentation

## 2011-10-28 HISTORY — DX: Essential (primary) hypertension: I10

## 2011-10-28 HISTORY — DX: Gastro-esophageal reflux disease without esophagitis: K21.9

## 2011-10-28 LAB — COMPREHENSIVE METABOLIC PANEL
ALT: 22 U/L (ref 0–35)
Albumin: 3.7 g/dL (ref 3.5–5.2)
Alkaline Phosphatase: 61 U/L (ref 39–117)
BUN: 7 mg/dL (ref 6–23)
Chloride: 106 mEq/L (ref 96–112)
Glucose, Bld: 98 mg/dL (ref 70–99)
Potassium: 3.5 mEq/L (ref 3.5–5.1)
Sodium: 144 mEq/L (ref 135–145)
Total Bilirubin: 0.2 mg/dL — ABNORMAL LOW (ref 0.3–1.2)
Total Protein: 6.8 g/dL (ref 6.0–8.3)

## 2011-10-28 LAB — CBC
Hemoglobin: 14.1 g/dL (ref 12.0–15.0)
Platelets: 249 10*3/uL (ref 150–400)
RBC: 4.87 MIL/uL (ref 3.87–5.11)

## 2011-10-28 LAB — DIFFERENTIAL
Basophils Relative: 0 % (ref 0–1)
Lymphs Abs: 2.4 10*3/uL (ref 0.7–4.0)
Monocytes Relative: 8 % (ref 3–12)
Neutro Abs: 4.2 10*3/uL (ref 1.7–7.7)
Neutrophils Relative %: 58 % (ref 43–77)

## 2011-10-28 LAB — URINALYSIS, ROUTINE W REFLEX MICROSCOPIC
Bilirubin Urine: NEGATIVE
Glucose, UA: NEGATIVE mg/dL
Hgb urine dipstick: NEGATIVE
Ketones, ur: 15 mg/dL — AB
Protein, ur: 30 mg/dL — AB
pH: 8.5 — ABNORMAL HIGH (ref 5.0–8.0)

## 2011-10-28 LAB — URINE MICROSCOPIC-ADD ON

## 2011-10-28 LAB — POCT PREGNANCY, URINE: Preg Test, Ur: NEGATIVE

## 2011-10-28 LAB — POCT I-STAT TROPONIN I

## 2011-10-28 MED ORDER — PANTOPRAZOLE SODIUM 40 MG IV SOLR
40.0000 mg | Freq: Once | INTRAVENOUS | Status: AC
  Administered 2011-10-28: 40 mg via INTRAVENOUS
  Filled 2011-10-28: qty 40

## 2011-10-28 MED ORDER — PANTOPRAZOLE SODIUM 20 MG PO TBEC: 40.0000 mg | DELAYED_RELEASE_TABLET | Freq: Every day | ORAL | Status: DC

## 2011-10-28 MED ORDER — GI COCKTAIL ~~LOC~~
30.0000 mL | Freq: Once | ORAL | Status: AC
  Administered 2011-10-28: 30 mL via ORAL
  Filled 2011-10-28: qty 30

## 2011-10-28 MED ORDER — PROMETHAZINE HCL 25 MG/ML IJ SOLN
25.0000 mg | Freq: Once | INTRAMUSCULAR | Status: AC
  Administered 2011-10-28: 25 mg via INTRAVENOUS
  Filled 2011-10-28: qty 1

## 2011-10-28 MED ORDER — ONDANSETRON HCL 4 MG/2ML IJ SOLN
4.0000 mg | Freq: Once | INTRAMUSCULAR | Status: AC
  Administered 2011-10-28: 4 mg via INTRAVENOUS
  Filled 2011-10-28: qty 2

## 2011-10-28 MED ORDER — SODIUM CHLORIDE 0.9 % IV SOLN
Freq: Once | INTRAVENOUS | Status: AC
  Administered 2011-10-28: 160 mL/h via INTRAVENOUS

## 2011-10-28 MED ORDER — SUCRALFATE 1 G PO TABS: ORAL_TABLET | ORAL | Status: DC

## 2011-10-28 MED ORDER — MORPHINE SULFATE 4 MG/ML IJ SOLN
4.0000 mg | Freq: Once | INTRAMUSCULAR | Status: AC
  Administered 2011-10-28: 4 mg via INTRAVENOUS
  Filled 2011-10-28: qty 1

## 2011-10-28 NOTE — ED Notes (Signed)
Patient presents with right sided chest, epigastric and RUQ abdominal pain since 0400 this AM which woke her from sleep.  Patient also reporting dizziness and nausea and describes pain as "burning" in nature.

## 2011-10-28 NOTE — ED Provider Notes (Signed)
History     CSN: [id number]  Arrival date & time 10/28/11  0706   None     Chief Complaint  Patient presents with  . Chest Pain    (Consider location/radiation/quality/duration/timing/severity/associated sxs/prior treatment) HPI Comments: The patient is a 31 year old woman who woke up at 4 AM today with pain in the right anterior chest and upper abdomen. She describes it as a burning pain. Had this pain before but not recently. She says she had relief of this pain in the past with a GI cocktail. Also feels dizzy. She took Alka-Seltzer and Tylenol without relief. She therefore sought evaluation at [facility] cone the ED.  Patient is a 31 y.o. female presenting with chest pain.  Chest Pain The chest pain began 3 - 5 hours ago. Duration of episode(s) is 3 hours. Chest pain occurs constantly. The chest pain is unchanged. Associated with: Nothing. At its most intense, the pain is at 10/10. The pain is currently at 10/10. The severity of the pain is severe. The quality of the pain is described as burning. The pain does not radiate. Exacerbated by:  nothing. Primary symptoms include abdominal pain and nausea. Pertinent negatives for primary symptoms include no vomiting. Associated symptoms comments:  dizziness. Risk factors: None. Past medical history comments: GERD, asthma     Past Medical History  Diagnosis Date  . Asthma   . Hypertension   . GERD (gastroesophageal reflux disease)     Past Surgical History  Procedure Date  . Dilation and curettage of uterus     No family history on file.  History  Substance Use Topics  . Smoking status: Former Smoker    Types: Cigars  . Smokeless tobacco: Not on file  . Alcohol Use: No    OB History    Grav Para Term Preterm Abortions TAB SAB Ect Mult Living                  Review of Systems  Constitutional: Negative.   HENT: Negative.   Respiratory: Negative.   Cardiovascular: Positive for chest pain.  Gastrointestinal: Positive for  nausea and abdominal pain. Negative for vomiting.  Genitourinary: Negative.   Musculoskeletal: Negative.   Skin: Negative.   Neurological: Negative.   Psychiatric/Behavioral: Negative.     Allergies  Hydrocodone-acetaminophen  Home Medications   Current Outpatient Rx  Name Route Sig Dispense Refill  . KNEE BRACE/HINGED BARS MEDIUM MISC Does not apply 1 Device by Does not apply route daily. 1 each 0    BP 119/77  Pulse 73  Temp(Src) 98 F (36.7 C) (Oral)  Resp 18  SpO2 100%  Physical Exam  Nursing note and vitals reviewed. Constitutional: She is oriented to person, place, and time. She appears well-developed and well-nourished.       Acute distress with right chest and epigastric and right upper quadrant pain.  HENT:  Head: Normocephalic and atraumatic.  Right Ear: External ear normal.  Left Ear: External ear normal.  Mouth/Throat: Oropharynx is clear and moist.  Eyes: Conjunctivae and EOM are normal. Pupils are equal, round, and reactive to light. No scleral icterus.  Neck: Normal range of motion. Neck supple. No JVD present.  Cardiovascular: Normal rate, regular rhythm and normal heart sounds.   Pulmonary/Chest: Effort normal and breath sounds normal.  Abdominal: Soft. Bowel sounds are normal. She exhibits no distension.       Right Upper quadrant tenderness, no mass rebound or rigidity.  Musculoskeletal: Normal range of motion.  Neurological:  She is alert and oriented to person, place, and time.       No Sensory or motor deficits.  Skin: Skin is warm and dry.  Psychiatric: She has a normal mood and affect. Her behavior is normal.    ED Course  Procedures (including critical care time)   7:26 AM  Date: 10/28/2011  Rate: 74  Rhythm: normal sinus rhythm  QRS Axis: normal  Intervals: normal  ST/T Wave abnormalities: normal  Conduction Disutrbances:none  Narrative Interpretation: Normal EKG  Old EKG Reviewed: unchanged  7:55 AM Pt was seen and had physical  examination.  Lab workup ordered.  GI cocktail, IV morphine and Zofran ordered.  EKG benign.   12:40 PM Results for orders placed during the hospital encounter of 10/28/11  CBC      Component Value Range   WBC 7.3  4.0 - 10.5 (K/uL)   RBC 4.87  3.87 - 5.11 (MIL/uL)   Hemoglobin 14.1  12.0 - 15.0 (g/dL)   HCT 40.9  81.1 - 91.4 (%)   MCV 86.4  78.0 - 100.0 (fL)   MCH 29.0  26.0 - 34.0 (pg)   MCHC 33.5  30.0 - 36.0 (g/dL)   RDW 78.2  95.6 - 21.3 (%)   Platelets 249  150 - 400 (K/uL)  DIFFERENTIAL      Component Value Range   Neutrophils Relative 58  43 - 77 (%)   Neutro Abs 4.2  1.7 - 7.7 (K/uL)   Lymphocytes Relative 32  12 - 46 (%)   Lymphs Abs 2.4  0.7 - 4.0 (K/uL)   Monocytes Relative 8  3 - 12 (%)   Monocytes Absolute 0.6  0.1 - 1.0 (K/uL)   Eosinophils Relative 1  0 - 5 (%)   Eosinophils Absolute 0.1  0.0 - 0.7 (K/uL)   Basophils Relative 0  0 - 1 (%)   Basophils Absolute 0.0  0.0 - 0.1 (K/uL)  COMPREHENSIVE METABOLIC PANEL      Component Value Range   Sodium 144  135 - 145 (mEq/L)   Potassium 3.5  3.5 - 5.1 (mEq/L)   Chloride 106  96 - 112 (mEq/L)   CO2 27  19 - 32 (mEq/L)   Glucose, Bld 98  70 - 99 (mg/dL)   BUN 7  6 - 23 (mg/dL)   Creatinine, Ser 0.86  0.50 - 1.10 (mg/dL)   Calcium 9.3  8.4 - 57.8 (mg/dL)   Total Protein 6.8  6.0 - 8.3 (g/dL)   Albumin 3.7  3.5 - 5.2 (g/dL)   AST 19  0 - 37 (U/L)   ALT 22  0 - 35 (U/L)   Alkaline Phosphatase 61  39 - 117 (U/L)   Total Bilirubin 0.2 (*) 0.3 - 1.2 (mg/dL)   GFR calc non Af Amer >90  >90 (mL/min)   GFR calc Af Amer >90  >90 (mL/min)  URINALYSIS, ROUTINE W REFLEX MICROSCOPIC      Component Value Range   Color, Urine AMBER (*) YELLOW    APPearance CLOUDY (*) CLEAR    Specific Gravity, Urine 1.031 (*) 1.005 - 1.030    pH 8.5 (*) 5.0 - 8.0    Glucose, UA NEGATIVE  NEGATIVE (mg/dL)   Hgb urine dipstick NEGATIVE  NEGATIVE    Bilirubin Urine NEGATIVE  NEGATIVE    Ketones, ur 15 (*) NEGATIVE (mg/dL)   Protein, ur 30  (*) NEGATIVE (mg/dL)   Urobilinogen, UA 1.0  0.0 - 1.0 (mg/dL)  Nitrite NEGATIVE  NEGATIVE    Leukocytes, UA TRACE (*) NEGATIVE   LIPASE, BLOOD      Component Value Range   Lipase 38  11 - 59 (U/L)  POCT I-STAT TROPONIN I      Component Value Range   Troponin i, poc 0.00  0.00 - 0.08 (ng/mL)   Comment 3           POCT PREGNANCY, URINE      Component Value Range   Preg Test, Ur NEGATIVE  NEGATIVE   URINE MICROSCOPIC-ADD ON      Component Value Range   Squamous Epithelial / LPF MANY (*) RARE    WBC, UA 0-2  <3 (WBC/hpf)   Bacteria, UA MANY (*) RARE    Urine-Other MUCOUS PRESENT     Dg Chest 2 View  10/28/2011  *RADIOLOGY REPORT*  Clinical Data:  Right-sided chest pain.  CHEST - 2 VIEW  Comparison: 06/08/2011  Findings: The heart size and mediastinal contours are within normal limits.  Both lungs are clear.  The visualized skeletal structures are unremarkable.  IMPRESSION: No active disease.  Original Report Authenticated By: Reola Calkins, M.D.   US Abdomen Complete  10/28/2011  *RADIOLOGY REPORT*  Clinical Data:  right upper quadrant pain, chest pain  COMPLETE ABDOMINAL ULTRASOUND  Comparison:  06/16/2006  Findings:  Gallbladder:  Gallbladder is mild distended without gallstones or thickening of the gallbladder wall.  No sonographic Murphy's sign. No pericholecystic fluid.  Common bile duct:  Within normal limits measures 3 mm in diameter.  Liver:  No focal lesion identified.  Within normal limits in parenchymal echogenicity.  IVC:  Appears normal.  Pancreas:  No focal abnormality seen.  Spleen:  Measures 6 cm in length.  Normal echogenicity.  Right Kidney:  Measures 10.4 cm in length.  No hydronephrosis or diagnostic renal calculus.  Left Kidney:  Measures 10 cm in length.  No hydronephrosis or diagnostic renal calculus.  Abdominal aorta:  No aneurysm identified. Measures up to 2.1 cm in diameter.  IMPRESSION:  1.  Mild distended gallbladder without evidence of gallstones.  No sonographic  Murphy's sign. 2.  Normal CBD. 3.  No hydronephrosis or diagnostic renal calculus.  Original Report Authenticated By: Natasha Mead, M.D.    Lab workup was essentially negative.  I think her symptoms are from GERD.  Rx with Protonix, Carafate, early followup at [facility]. Case discussed with [facility] resident, who advised that pt should call the clinic tomorrow morning to be seen early this week.     1. GERD (gastroesophageal reflux disease)        Carleene Cooper III, MD 10/28/11 [phone]

## 2011-10-28 NOTE — ED Notes (Signed)
Pt states she has heartburn.

## 2011-10-28 NOTE — Discharge Instructions (Signed)
Ms. Staggs, your tests suggest that your pain is coming from gastroesophageal reflux.  Take Protonix 40 mg once a day.  This medicine cuts down on the amount of acid that your stomach makes.  Chew up and swallow a Carafate tablet before meals and at bedtime.  Dr. Ignacia Palma spoke to the [facility] doctor on call, who advised that you should call the [facility] at 8:30 A.M. Tomorrow to be seen early next week.

## 2011-10-28 NOTE — ED Notes (Signed)
Pt given IV Zofran and then took GI cocktail.  Pt vomited approximately one minute after drinking GI cocktail.

## 2011-10-30 ENCOUNTER — Encounter: Payer: Self-pay | Admitting: Family Medicine

## 2011-10-30 ENCOUNTER — Inpatient Hospital Stay (HOSPITAL_COMMUNITY)
Admission: AD | Admit: 2011-10-30 | Discharge: 2011-11-02 | DRG: 419 | Disposition: A | Discharge status: Home / Self Care | Payer: Medicaid Other | Source: Ambulatory Visit | Attending: Family Medicine | Admitting: Family Medicine

## 2011-10-30 ENCOUNTER — Ambulatory Visit (INDEPENDENT_AMBULATORY_CARE_PROVIDER_SITE_OTHER): Payer: Medicaid Other | Admitting: Family Medicine

## 2011-10-30 ENCOUNTER — Encounter (HOSPITAL_COMMUNITY): Payer: Self-pay

## 2011-10-30 ENCOUNTER — Ambulatory Visit (HOSPITAL_COMMUNITY)
Admission: RE | Admit: 2011-10-30 | Discharge: 2011-10-30 | Disposition: A | Discharge status: Home / Self Care | Payer: Medicaid Other | Source: Ambulatory Visit | Attending: Family Medicine | Admitting: Family Medicine

## 2011-10-30 VITALS — BP 120/87 | HR 93 | Temp 98.6°F | Wt 185.0 lb

## 2011-10-30 DIAGNOSIS — R110 Nausea: Secondary | ICD-10-CM | POA: Insufficient documentation

## 2011-10-30 DIAGNOSIS — R112 Nausea with vomiting, unspecified: Secondary | ICD-10-CM

## 2011-10-30 DIAGNOSIS — R42 Dizziness and giddiness: Secondary | ICD-10-CM | POA: Diagnosis present

## 2011-10-30 DIAGNOSIS — R1011 Right upper quadrant pain: Secondary | ICD-10-CM

## 2011-10-30 DIAGNOSIS — K810 Acute cholecystitis: Principal | ICD-10-CM | POA: Diagnosis present

## 2011-10-30 DIAGNOSIS — F17200 Nicotine dependence, unspecified, uncomplicated: Secondary | ICD-10-CM | POA: Diagnosis present

## 2011-10-30 DIAGNOSIS — J45909 Unspecified asthma, uncomplicated: Secondary | ICD-10-CM | POA: Diagnosis present

## 2011-10-30 DIAGNOSIS — I10 Essential (primary) hypertension: Secondary | ICD-10-CM | POA: Diagnosis present

## 2011-10-30 DIAGNOSIS — K8000 Calculus of gallbladder with acute cholecystitis without obstruction: Secondary | ICD-10-CM

## 2011-10-30 DIAGNOSIS — R197 Diarrhea, unspecified: Secondary | ICD-10-CM | POA: Insufficient documentation

## 2011-10-30 DIAGNOSIS — K219 Gastro-esophageal reflux disease without esophagitis: Secondary | ICD-10-CM | POA: Diagnosis present

## 2011-10-30 DIAGNOSIS — R509 Fever, unspecified: Secondary | ICD-10-CM | POA: Insufficient documentation

## 2011-10-30 DIAGNOSIS — R079 Chest pain, unspecified: Secondary | ICD-10-CM | POA: Diagnosis present

## 2011-10-30 DIAGNOSIS — R109 Unspecified abdominal pain: Secondary | ICD-10-CM | POA: Insufficient documentation

## 2011-10-30 LAB — CBC
HCT: 41 % (ref 36.0–46.0)
Hemoglobin: 14.2 g/dL (ref 12.0–15.0)
MCH: 29.3 pg (ref 26.0–34.0)
MCHC: 34.6 g/dL (ref 30.0–36.0)

## 2011-10-30 LAB — COMPREHENSIVE METABOLIC PANEL
AST: 20 U/L (ref 0–37)
CO2: 24 mEq/L (ref 19–32)
Chloride: 100 mEq/L (ref 96–112)
Creatinine, Ser: 0.75 mg/dL (ref 0.50–1.10)
GFR calc non Af Amer: >90 mL/min (ref >90)
Total Bilirubin: 0.5 mg/dL (ref 0.3–1.2)

## 2011-10-30 LAB — DIFFERENTIAL
Basophils Relative: 0 % (ref 0–1)
Eosinophils Absolute: 0.1 10*3/uL (ref 0.0–0.7)
Eosinophils Relative: 1 % (ref 0–5)
Monocytes Absolute: 1 10*3/uL (ref 0.1–1.0)
Monocytes Relative: 11 % (ref 3–12)
Neutrophils Relative %: 60 % (ref 43–77)

## 2011-10-30 LAB — PROTIME-INR: Prothrombin Time: 14.3 seconds (ref 11.6–15.2)

## 2011-10-30 MED ORDER — KETOROLAC TROMETHAMINE 60 MG/2ML IM SOLN
60.0000 mg | Freq: Once | INTRAMUSCULAR | Status: AC
  Administered 2011-10-30: 60 mg via INTRAMUSCULAR

## 2011-10-30 MED ORDER — SODIUM CHLORIDE 0.9 % IV SOLN
INTRAVENOUS | Status: DC
  Administered 2011-10-30 – 2011-10-31 (×3): via INTRAVENOUS
  Administered 2011-11-01: 100 mL/h via INTRAVENOUS
  Administered 2011-11-01 – 2011-11-02 (×2): via INTRAVENOUS

## 2011-10-30 MED ORDER — OXYCODONE-ACETAMINOPHEN 5-325 MG PO TABS: 1.0000 | ORAL_TABLET | Freq: Three times a day (TID) | ORAL | Status: DC | PRN

## 2011-10-30 MED ORDER — LORAZEPAM 2 MG/ML IJ SOLN
1.0000 mg | Freq: Once | INTRAMUSCULAR | Status: AC
  Administered 2011-10-30: 1 mg via INTRAMUSCULAR

## 2011-10-30 MED ORDER — HEPARIN SODIUM (PORCINE) 5000 UNIT/ML IJ SOLN
5000.0000 [IU] | Freq: Three times a day (TID) | INTRAMUSCULAR | Status: DC
  Administered 2011-10-30: 5000 [IU] via SUBCUTANEOUS
  Filled 2011-10-30 (×2): qty 1

## 2011-10-30 MED ORDER — ONDANSETRON HCL 4 MG PO TABS: 4.0000 mg | ORAL_TABLET | Freq: Three times a day (TID) | ORAL | Status: DC | PRN

## 2011-10-30 MED ORDER — MORPHINE SULFATE 2 MG/ML IJ SOLN
1.0000 mg | INTRAMUSCULAR | Status: DC | PRN
  Administered 2011-10-30 – 2011-11-02 (×9): 1 mg via INTRAVENOUS
  Filled 2011-10-30 (×9): qty 1

## 2011-10-30 MED ORDER — IOHEXOL 300 MG/ML  SOLN
100.0000 mL | Freq: Once | INTRAMUSCULAR | Status: AC | PRN
  Administered 2011-10-30: 100 mL via INTRAVENOUS

## 2011-10-30 MED ORDER — ACETAMINOPHEN 650 MG RE SUPP: 650.0000 mg | Freq: Four times a day (QID) | RECTAL | Status: DC | PRN

## 2011-10-30 MED ORDER — PANTOPRAZOLE SODIUM 40 MG PO TBEC: 40.0000 mg | DELAYED_RELEASE_TABLET | Freq: Every day | ORAL | Status: DC

## 2011-10-30 MED ORDER — ONDANSETRON HCL 4 MG/2ML IJ SOLN: 4.0000 mg | Freq: Four times a day (QID) | INTRAMUSCULAR | Status: DC | PRN

## 2011-10-30 MED ORDER — PIPERACILLIN-TAZOBACTAM 3.375 G IVPB
3.3750 g | Freq: Three times a day (TID) | INTRAVENOUS | Status: DC
  Administered 2011-10-31 (×2): 3.375 g via INTRAVENOUS
  Filled 2011-10-30 (×5): qty 50

## 2011-10-30 MED ORDER — ACETAMINOPHEN 325 MG PO TABS
650.0000 mg | ORAL_TABLET | Freq: Four times a day (QID) | ORAL | Status: DC | PRN
  Filled 2011-10-30: qty 1

## 2011-10-30 NOTE — Consult Note (Signed)
Reason for Consult:Acute cholecystitis  Referring Physician: Dr. Theressa Millard, Family Practice  Ruth [name] is an 31 y.o. female.  HPI: This is a 31 y.o. year old female who presents with 3 day history of RUQ pain, nausea and vomiting. Initially, the patient was evaluated in the ED on Sunday 10/28/11 for chest pain and "heartburn." Also with vomiting at that time. At that time, she had a RUQ ultrasound that showed no sign of cholecystitis or cholelithiasis.  She was discharged home with PRN pain medication.  Since then, her pain has progressed and is now most significant over her RUQ, under her right costal margin, and down her right side. She occasionally feels hungry, but immediately becomes nauseated if she tries to eat. Has not really eaten since 11pm on Saturday night.. Has never had pain like this before, no known history of gallstones. + N/V, has had diarrhea. No fever or chills. Was seen in [facility] clinic earlier today and sent to hospital for CT scan; called back for admission once acute cholecystitis was identified.    Past Medical History  Diagnosis Date  . Asthma   . Hypertension   . GERD (gastroesophageal reflux disease)     Past Surgical History  Procedure Date  . Dilation and curettage of uterus     No family history on file.  Social History:  reports that she has quit smoking. Her smoking use included Cigars. She does not have any smokeless tobacco history on file. She reports that she does not drink alcohol or use illicit drugs.  Allergies:  Allergies  Allergen Reactions  . Hydrocodone-Acetaminophen Nausea And Vomiting and Swelling    Medications:  Prior to Admission:  Prescriptions prior to admission  Medication Sig Dispense Refill  . diphenhydramine-acetaminophen (TYLENOL PM) 25-500 MG TABS Take 4 tablets by mouth at bedtime as needed. To help sleep.      . medroxyPROGESTERone (DEPO-SUBQ PROVERA) 104 MG/0.65ML injection Inject 104 mg into the skin every 3 (three)  months.        Results for orders placed during the hospital encounter of 10/30/11 (from the past 48 hour(s))  COMPREHENSIVE METABOLIC PANEL     Status: Abnormal   Collection Time   10/30/11 10:05 PM      Component Value Range Comment   Sodium 137  135 - 145 (mEq/L)    Potassium 3.3 (*) 3.5 - 5.1 (mEq/L)    Chloride 100  96 - 112 (mEq/L)    CO2 24  19 - 32 (mEq/L)    Glucose, Bld 83  70 - 99 (mg/dL)    BUN 7  6 - 23 (mg/dL)    Creatinine, Ser 1.47  0.50 - 1.10 (mg/dL)    Calcium 9.2  8.4 - 10.5 (mg/dL)    Total Protein 6.9  6.0 - 8.3 (g/dL)    Albumin 3.7  3.5 - 5.2 (g/dL)    AST 20  0 - 37 (U/L)    ALT 24  0 - 35 (U/L)    Alkaline Phosphatase 56  39 - 117 (U/L)    Total Bilirubin 0.5  0.3 - 1.2 (mg/dL)    GFR calc non Af Amer >90  >90 (mL/min)    GFR calc Af Amer >90  >90 (mL/min)   APTT     Status: Normal   Collection Time   10/30/11 10:05 PM      Component Value Range Comment   aPTT 33  24 - 37 (seconds)   PROTIME-INR  Status: Normal   Collection Time   10/30/11 10:05 PM      Component Value Range Comment   Prothrombin Time 14.3  11.6 - 15.2 (seconds)    INR 1.09  0.00 - 1.49    CBC     Status: Normal   Collection Time   10/30/11 10:05 PM      Component Value Range Comment   WBC 9.1  4.0 - 10.5 (K/uL)    RBC 4.85  3.87 - 5.11 (MIL/uL)    Hemoglobin 14.2  12.0 - 15.0 (g/dL)    HCT 11.9  14.7 - 82.9 (%)    MCV 84.5  78.0 - 100.0 (fL)    MCH 29.3  26.0 - 34.0 (pg)    MCHC 34.6  30.0 - 36.0 (g/dL)    RDW 56.2  13.0 - 86.5 (%)    Platelets 220  150 - 400 (K/uL)   DIFFERENTIAL     Status: Normal   Collection Time   10/30/11 10:05 PM      Component Value Range Comment   Neutrophils Relative 60  43 - 77 (%)    Neutro Abs 5.4  1.7 - 7.7 (K/uL)    Lymphocytes Relative 28  12 - 46 (%)    Lymphs Abs 2.5  0.7 - 4.0 (K/uL)    Monocytes Relative 11  3 - 12 (%)    Monocytes Absolute 1.0  0.1 - 1.0 (K/uL)    Eosinophils Relative 1  0 - 5 (%)    Eosinophils Absolute 0.1  0.0 -  0.7 (K/uL)    Basophils Relative 0  0 - 1 (%)    Basophils Absolute 0.0  0.0 - 0.1 (K/uL)     Ct Abdomen Pelvis W Contrast  10/30/2011  *RADIOLOGY REPORT*  Clinical Data: Right upper quadrant abdominal pain for 3 days. Vomiting.  Right lower quadrant pain.  Fever.  Diarrhea for 3 days.  CT ABDOMEN AND PELVIS WITH CONTRAST  Technique:  Multidetector CT imaging of the abdomen and pelvis was performed following the standard protocol during bolus administration of intravenous contrast.  Contrast: OMNIPAQUE IOHEXOL 300 MG/ML  SOLN  Comparison: 10/28/2011 ultrasound.  No prior CT.  Findings: Clear lung bases.  Normal heart size without pericardial or pleural effusion.  Focal steatosis adjacent the falciform ligament.  Normal spleen, stomach, pancreas.  The gallbladder is inflamed, with wall thickening and pericholecystic edema, including on image 31.  There is likely a stone within the gallbladder neck, 1.3 cm on image 21.  No biliary ductal dilatation.  Normal adrenal glands.  Too small to characterize right renal lesion.  Normal left kidney.  No retroperitoneal or retrocrural adenopathy.  There may be mild secondary inflammation of the hepatic flexure colon, including on image 37.  Normal terminal ileum and appendix.  Normal small bowel.  No pelvic adenopathy.  Normal urinary bladder and uterus.  No adnexal mass.  Small volume cul-de-sac fluid is slightly greater than typically seen physiologically. Slightly increased in density, suggesting minimal complexity.  IMPRESSION:  1.  Acute cholecystitis. I personally called and discussed this report with Roslynn Amble, M.D.   at 7:51 p.m. on 10/30/2011. Possible mild secondary inflammation of the hepatic flexure colon. 2.  Small volume cul-de-sac fluid.  Minimal complexity within. This could be physiologic, or secondary to the abdominal process. Slightly more than typically seen physiologically.  Original Report Authenticated By: Consuello Bossier, M.D.     ROS Blood pressure 129/69, pulse 82, temperature  98.1 F (36.7 C), temperature source Oral, resp. rate 20, height 5' 6.14" (1.68 m), weight 185 lb (83.915 kg), SpO2 100.00%. Physical Exam WDWN in NAD HEENT:  EOMI, sclera anicteric Neck:  No masses, no thyromegaly Lungs:  CTA bilaterally; normal respiratory effort CV:  Regular rate and rhythm; no murmurs Abd:  Mildly distended, tender in the RUQ and epigastrium Ext:  Well-perfused; no edema Skin:  Warm, dry; no sign of jaundice   Assessment/Plan: Acute cholecystitis   Recommendations:  IV antibiotics NPO p MN Hold anticoagulation after midnight Will plan laparoscopic cholecystectomy with intraoperative cholangiogram this admission.  Devoiry Corriher K. 10/30/2011, 11:27 PM

## 2011-10-30 NOTE — Progress Notes (Signed)
ANTIBIOTIC CONSULT NOTE - INITIAL  Pharmacy Consult for Zosyn Indication: acute cholecystitis  Allergies  Allergen Reactions  . Hydrocodone-Acetaminophen Nausea And Vomiting and Swelling    Patient Measurements: Height: 5' 6.14" (168 cm) Weight: 185 lb (83.915 kg) IBW/kg (Calculated) : 59.63  Adjusted Body Weight:   Vital Signs: Temp: 98.1 F (36.7 C) (04/09 2113) Temp src: Oral (04/09 2113) BP: 129/69 mmHg (04/09 2113) Pulse Rate: 82  (04/09 2113) Intake/Output from previous day:   Intake/Output from this shift:    Labs:  [facility] 10/30/11 2205 10/28/11 0745  WBC 9.1 7.3  HGB 14.2 14.1  PLT 220 249  LABCREA -- --  CREATININE -- 0.79   Estimated Creatinine Clearance: 111.5 ml/min (by C-G formula based on Cr of 0.79). No results found for this basename: VANCOTROUGH:2,VANCOPEAK:2,VANCORANDOM:2,GENTTROUGH:2,GENTPEAK:2,GENTRANDOM:2,TOBRATROUGH:2,TOBRAPEAK:2,TOBRARND:2,AMIKACINPEAK:2,AMIKACINTROU:2,AMIKACIN:2, in the last 72 hours   Microbiology: No results found for this or any previous visit (from the past 720 hour(s)).  Medical History: Past Medical History  Diagnosis Date  . Asthma   . Hypertension   . GERD (gastroesophageal reflux disease)     Medications:  Scheduled:    . heparin  5,000 Units Subcutaneous Q8H  . pantoprazole  40 mg Oral Daily  . piperacillin-tazobactam (ZOSYN)  IV  3.375 g Intravenous Q8H   Assessment: 31 yr old female with acute cholecystitis admitted for surgery consult.   Plan:  Zosyn 3.375 Gm IV q8h (4hr infusion).  Eugene Garnet 10/30/2011,10:42 PM

## 2011-10-30 NOTE — Patient Instructions (Signed)
Take the Percocet for pain relief. Take the Zofran for nausea to help you drink. Come back and see me tomorrow.

## 2011-10-30 NOTE — H&P (Signed)
Family Medicine Teaching [facility] Admission History and Physical  [name] Medical record number: [id number] Date of birth: [birth year] Age: 31 y.o. Gender: female  Primary Care Provider: [facility], Marylene Land, MD, MD  Chief Complaint: RUQ pain, acute cholecystitis  History of Present Illness: Ruth [name] is a 31 y.o. year old female  with PMH significant for GERD presents with 3 day history of RUQ pain.  Initially patient was evaluated in the ED on Sunday 10/28/11 for chest pain and "heartburn."  Also with vomiting at that time.  Since then, her pain has progressed and is now most significant over her RUQ, under her right breast, and down her right side.  She occasionally feels hungry, but immediately becomes nauseated if she tries to eat.  Has not really eaten since 11pm on Saturday night after she went to [employer].  Has never had pain like this before, no known history of gallstones.  + N/V, has had diarrhea.  No fever or chills.   Was seen in clinic earlier today and sent to hospital for CT scan; called back for admission once acute cholecystitis was identified.   No abdominal surgeries-- has had 4 vaginal deliveries and D&C.    Patient Active Problem List  Diagnoses  . TOBACCO USER  . ASTHMA, INTERMITTENT  . Positive PPD  . Left knee pain  . RUQ pain   Past Medical History: Past Medical History  Diagnosis Date  . Asthma   . Hypertension   . GERD (gastroesophageal reflux disease)     Past Surgical History: Past Surgical History  Procedure Date  . Dilation and curettage of uterus     Social History: History   Social History  . Marital Status: Single    Spouse Name: N/A    Number of Children: N/A  . Years of Education: N/A   Social History Main Topics  . Smoking status: Former Smoker    Types: Cigars  . Smokeless tobacco: Not on file  . Alcohol Use: No  . Drug Use: No  . Sexually Active: Not on file   Other Topics Concern  . Not on file    Social History Narrative  . No narrative on file    Family History: No family history on file.  Allergies: Allergies  Allergen Reactions  . Hydrocodone-Acetaminophen Nausea And Vomiting and Swelling    No current facility-administered medications for this encounter.   Facility-Administered Medications Ordered in Other Encounters  Medication Dose Route Frequency Provider Last Rate Last Dose  . iohexol (OMNIPAQUE) 300 MG/ML solution 100 mL  100 mL Intravenous Once PRN Medication Radiologist, MD   100 mL at 10/30/11 1924   Review Of Systems: Per HPI with the following additions. Otherwise 12 point review of systems was performed and was unremarkable.  Physical Exam: Pulse: 93  Blood Pressure: 120/87 RR: 18   Temp: 98.6  General: alert, cooperative and mild distress HEENT: extra ocular movement intact and sclera clear, anicteric Heart: S1, S2 normal, no murmur, rub or gallop, regular rate and rhythm Lungs: clear to auscultation, no wheezes or rales and unlabored breathing Abdomen: moderate tenderness in the in the RUQ and in the RLQ., abdominal guarding is present; +BS Extremities: extremities normal, atraumatic, no cyanosis or edema Skin:no rashes Neurology: normal without focal findings and PERLA  Labs and Imaging: Lab Results  Component Value Date/Time   NA 144 10/28/2011  7:45 AM   K 3.5 10/28/2011  7:45 AM   CL 106  10/28/2011  7:45 AM   CO2 27 10/28/2011  7:45 AM   BUN 7 10/28/2011  7:45 AM   CREATININE 0.79 10/28/2011  7:45 AM   GLUCOSE 98 10/28/2011  7:45 AM   Lab Results  Component Value Date   WBC 7.3 10/28/2011   HGB 14.1 10/28/2011   HCT 42.1 10/28/2011   MCV 86.4 10/28/2011   PLT 249 10/28/2011    CT SCAN: Acute cholecystitis. Possible mild secondary inflammation of the hepatic flexure colon. Small volume cul-de-sac fluid. Minimal complexity within. This could be physiologic, or secondary to the abdominal process.  Slightly more than typically seen  physiologically.    Assessment and Plan: Ruth [name] is a 31 y.o. year old female presenting with acute cholecystitis 1. Acute cholecystitis: consistent on history, physical, and imaging -Gen Surgery, Dr. Harlon Flor has been called; we greatly appreciate their consult -Will start Zosyn per surgery preference -NPO after MN for potential cholecystectomy in AM; will allow pt clear liquids until that time if she can tolerate -IV morphine for pain control (if worsens pain, will change to dilaudid) -IV zofran for nausea; will switch to po once no longer NPO -NS @100cc /hr for rehydration (not significantly dehydrated on exam but pt with 3 days of N/V and will be NPO) -Will check CBC, CMET, PT/INR  2. H/o asthma: pt reports she has not needed any medications in many years -lungs clear tonight, no SOB or difficulty breathing -will encourage IS and OOB, especially if she undergoes lap chole -no need for albuterol at this time, but will add if she starts wheezing  3. FEN/GI: clear liquids --> NPO p MN  4. Prophylaxis: SQ heparin, protonix  5. Disposition: surgery to see, dispo pending clinical improvement   Kassidie Hendriks 10/30/2011, 10:12 PM

## 2011-10-30 NOTE — Progress Notes (Signed)
  Subjective:    Patient ID: Ruth [name], female    DOB: 1980-11-28, 31 y.o.   MRN: [id number]  HPI 1.  RUQ pain:  31 yo Female with no history of gallstones who presented to ED 3 days ago with GERD type symptoms and prescribed Protonix at that time.  States that after she left ED began having increasingly worse RUQ pain described as sharp, stabbing in nature.  Vomited x2 that evening.  Has vomitined several times since then including 1 this AM.  Non-bloody, yellowish bile in nature.  Fevers and chills subjectively began yesterday.  Has never had pain like this before.  Describes as 10/10 in severity.  Taking Tylenol PM without relief.  No PO intake for 2 days, no fluid intake since yesterday evening.  States this does NOT feel like usual GERD symptoms.    The following portions of the patient's history were reviewed and updated as appropriate: allergies, current medications, past medical history, family and social history, and problem list.  Patient is a smoker.    I have personally reviewed all recent medical records including labs and images.  Do note report of distended gallbladder on abdomen us/s Sunday evening.  Also with asymptomatic gallstones found on CT chest 2 years ago.  She does have GERD as noted and has been taking Protonix without relief.   Review of Systems See HPI above for review of systems.       Objective:   Physical Exam  Gen:  AA Female appears stated age sitting, in moderate distress Head: [place]/AT Eyes: no scleral icterus Mouth:  Moderately dry MM Neck:  NO LAD Heart: tachycardic on my exam, no murmur Pulm:  Clear to auscultation bilaterally with good air movement.  No wheezes or rales noted.   ABd:  Soft.  Nondistended.  TTP greatest at RUQ.  Some TTP RLQ.  Some tenderness in RUQ with palpation of Left quadrants. Some voluntary guarding.  No rebound.  SHe has difficulty lying supine due to pain. Ext:  No edema.  Skin:  No rash noted abdomen or elsewhere Neuro:   Alert and oriented. Psych:  Linear thought process      Assessment & Plan:

## 2011-10-30 NOTE — Assessment & Plan Note (Signed)
Concern for acute cholecystitis as she is now describing fevers and chills subjectively. Plan for stat CT abdomen for further evaluation. Provided 60 mg Toradol and 1 mg Ativan for pain relief so she can lie supine for scan. Will discuss with inpatient team -- she may require admission depending on results.  Provided prescription for Percocet (allergic to Vicodin) and Zofran, FU with me tomorrow.  No rebound tenderness or periotoneal signs.   Much less likely appendicitis due to location of pain.   Precepted with Dr. Deirdre Priest who also examined patient and agrees with plan.

## 2011-10-31 ENCOUNTER — Encounter (HOSPITAL_COMMUNITY): Payer: Self-pay | Admitting: *Deleted

## 2011-10-31 ENCOUNTER — Inpatient Hospital Stay (HOSPITAL_COMMUNITY): Payer: Medicaid Other | Admitting: Anesthesiology

## 2011-10-31 ENCOUNTER — Encounter (HOSPITAL_COMMUNITY)
Admission: AD | Disposition: A | Discharge status: Home / Self Care | Payer: Self-pay | Source: Ambulatory Visit | Attending: Family Medicine

## 2011-10-31 ENCOUNTER — Encounter (HOSPITAL_COMMUNITY): Payer: Self-pay | Admitting: Anesthesiology

## 2011-10-31 HISTORY — PX: CHOLECYSTECTOMY: SHX55

## 2011-10-31 SURGERY — LAPAROSCOPIC CHOLECYSTECTOMY
Anesthesia: General | Site: Abdomen | Wound class: Clean Contaminated

## 2011-10-31 MED ORDER — LACTATED RINGERS IV SOLN
INTRAVENOUS | Status: DC | PRN
  Administered 2011-10-31 (×2): via INTRAVENOUS
  Administered 2011-10-31: 50 mL

## 2011-10-31 MED ORDER — ROCURONIUM BROMIDE 100 MG/10ML IV SOLN
INTRAVENOUS | Status: DC | PRN
  Administered 2011-10-31: 50 mg via INTRAVENOUS

## 2011-10-31 MED ORDER — BUPIVACAINE-EPINEPHRINE 0.25% -1:200000 IJ SOLN
INTRAMUSCULAR | Status: DC | PRN
  Administered 2011-10-31: 14 mL

## 2011-10-31 MED ORDER — FENTANYL CITRATE 0.05 MG/ML IJ SOLN
INTRAMUSCULAR | Status: DC | PRN
  Administered 2011-10-31: 100 ug via INTRAVENOUS
  Administered 2011-10-31: 50 ug via INTRAVENOUS
  Administered 2011-10-31: 150 ug via INTRAVENOUS
  Administered 2011-10-31: 50 ug via INTRAVENOUS

## 2011-10-31 MED ORDER — ONDANSETRON HCL 4 MG/2ML IJ SOLN: 4.0000 mg | Freq: Four times a day (QID) | INTRAMUSCULAR | Status: DC | PRN

## 2011-10-31 MED ORDER — GLYCOPYRROLATE 0.2 MG/ML IJ SOLN
INTRAMUSCULAR | Status: DC | PRN
  Administered 2011-10-31: .6 mg via INTRAVENOUS

## 2011-10-31 MED ORDER — ALUM & MAG HYDROXIDE-SIMETH 200-200-20 MG/5ML PO SUSP
30.0000 mL | ORAL | Status: DC | PRN
  Administered 2011-11-01: 30 mL via ORAL
  Filled 2011-10-31: qty 30

## 2011-10-31 MED ORDER — HYDROMORPHONE HCL PF 1 MG/ML IJ SOLN
INTRAMUSCULAR | Status: AC
  Filled 2011-10-31: qty 1

## 2011-10-31 MED ORDER — SODIUM CHLORIDE 0.9 % IR SOLN
Status: DC | PRN
  Administered 2011-10-31 (×2): 1000 mL

## 2011-10-31 MED ORDER — OXYCODONE-ACETAMINOPHEN 5-325 MG PO TABS
1.0000 | ORAL_TABLET | ORAL | Status: DC | PRN
  Administered 2011-10-31: 1 via ORAL
  Administered 2011-11-01 – 2011-11-02 (×5): 2 via ORAL
  Filled 2011-10-31: qty 2
  Filled 2011-10-31: qty 1
  Filled 2011-10-31 (×4): qty 2

## 2011-10-31 MED ORDER — ONDANSETRON HCL 4 MG/2ML IJ SOLN
INTRAMUSCULAR | Status: DC | PRN
  Administered 2011-10-31: 4 mg via INTRAVENOUS

## 2011-10-31 MED ORDER — PANTOPRAZOLE SODIUM 40 MG IV SOLR
40.0000 mg | INTRAVENOUS | Status: DC
  Administered 2011-10-31 – 2011-11-01 (×2): 40 mg via INTRAVENOUS
  Filled 2011-10-31 (×4): qty 40

## 2011-10-31 MED ORDER — PROPOFOL 10 MG/ML IV EMUL
INTRAVENOUS | Status: DC | PRN
  Administered 2011-10-31: 200 mg via INTRAVENOUS

## 2011-10-31 MED ORDER — HYDROMORPHONE HCL PF 1 MG/ML IJ SOLN
0.2500 mg | INTRAMUSCULAR | Status: DC | PRN
  Administered 2011-10-31 (×3): 0.5 mg via INTRAVENOUS

## 2011-10-31 MED ORDER — NEOSTIGMINE METHYLSULFATE 1 MG/ML IJ SOLN
INTRAMUSCULAR | Status: DC | PRN
  Administered 2011-10-31: 4 mg via INTRAVENOUS

## 2011-10-31 MED ORDER — MIDAZOLAM HCL 5 MG/5ML IJ SOLN
INTRAMUSCULAR | Status: DC | PRN
  Administered 2011-10-31: 2 mg via INTRAVENOUS

## 2011-10-31 MED ORDER — 0.9 % SODIUM CHLORIDE (POUR BTL) OPTIME
TOPICAL | Status: DC | PRN
  Administered 2011-10-31: 1000 mL

## 2011-10-31 MED ORDER — POTASSIUM CHLORIDE 10 MEQ/100ML IV SOLN
10.0000 meq | INTRAVENOUS | Status: AC
  Administered 2011-10-31 (×2): 10 meq via INTRAVENOUS
  Filled 2011-10-31 (×2): qty 100

## 2011-10-31 MED ORDER — LACTATED RINGERS IV SOLN: INTRAVENOUS | Status: DC

## 2011-10-31 SURGICAL SUPPLY — 50 items
APPLIER CLIP 5 13 M/L LIGAMAX5 (MISCELLANEOUS) ×3
APPLIER CLIP ROT 10 11.4 M/L (STAPLE)
APR CLP MED LRG 5 ANG JAW (MISCELLANEOUS) ×1
BLADE SURG ROTATE 9660 (MISCELLANEOUS) IMPLANT
CANISTER SUCTION 2500CC (MISCELLANEOUS) ×3 IMPLANT
CHLORAPREP W/TINT 26ML (MISCELLANEOUS) ×3 IMPLANT
CLIP APPLIE 5 13 M/L LIGAMAX5 (MISCELLANEOUS) ×2 IMPLANT
CLIP APPLIE ROT 10 11.4 M/L (STAPLE) IMPLANT
CLOTH BEACON ORANGE TIMEOUT ST (SAFETY) ×3 IMPLANT
COVER MAYO STAND STRL (DRAPES) ×3 IMPLANT
COVER SURGICAL LIGHT HANDLE (MISCELLANEOUS) ×3 IMPLANT
DECANTER SPIKE VIAL GLASS SM (MISCELLANEOUS) ×6 IMPLANT
DERMABOND ADVANCED (GAUZE/BANDAGES/DRESSINGS) ×1
DERMABOND ADVANCED .7 DNX12 (GAUZE/BANDAGES/DRESSINGS) ×2 IMPLANT
DRAPE C-ARM 42X72 X-RAY (DRAPES) ×3 IMPLANT
DRAPE UTILITY 15X26 W/TAPE STR (DRAPE) ×6 IMPLANT
ELECT REM PT RETURN 9FT ADLT (ELECTROSURGICAL) ×3
ELECTRODE REM PT RTRN 9FT ADLT (ELECTROSURGICAL) ×2 IMPLANT
FILTER SMOKE EVAC LAPAROSHD (FILTER) IMPLANT
GLOVE BIO SURGEON STRL SZ7.5 (GLOVE) ×3 IMPLANT
GLOVE BIO SURGEON STRL SZ8 (GLOVE) ×3 IMPLANT
GLOVE BIOGEL PI IND STRL 6.5 (GLOVE) ×2 IMPLANT
GLOVE BIOGEL PI IND STRL 7.5 (GLOVE) ×2 IMPLANT
GLOVE BIOGEL PI IND STRL 8 (GLOVE) ×2 IMPLANT
GLOVE BIOGEL PI INDICATOR 6.5 (GLOVE) ×1
GLOVE BIOGEL PI INDICATOR 7.5 (GLOVE) ×1
GLOVE BIOGEL PI INDICATOR 8 (GLOVE) ×1
GLOVE ECLIPSE 6.5 STRL STRAW (GLOVE) ×3 IMPLANT
GLOVE EXAM NITRILE MD LF STRL (GLOVE) ×3 IMPLANT
GOWN PREVENTION PLUS XLARGE (GOWN DISPOSABLE) ×3 IMPLANT
GOWN STRL NON-REIN LRG LVL3 (GOWN DISPOSABLE) ×9 IMPLANT
KIT BASIN OR (CUSTOM PROCEDURE TRAY) ×3 IMPLANT
KIT ROOM TURNOVER OR (KITS) ×3 IMPLANT
NS IRRIG 1000ML POUR BTL (IV SOLUTION) ×3 IMPLANT
PAD ARMBOARD 7.5X6 YLW CONV (MISCELLANEOUS) ×6 IMPLANT
POUCH SPECIMEN RETRIEVAL 10MM (ENDOMECHANICALS) ×3 IMPLANT
SCISSORS LAP 5X35 DISP (ENDOMECHANICALS) IMPLANT
SET CHOLANGIOGRAPH 5 50 .035 (SET/KITS/TRAYS/PACK) ×3 IMPLANT
SET IRRIG TUBING LAPAROSCOPIC (IRRIGATION / IRRIGATOR) ×3 IMPLANT
SLEEVE ADV FIXATION 5X100MM (TROCAR) ×6 IMPLANT
SLEEVE Z-THREAD 5X100MM (TROCAR) ×6 IMPLANT
SPECIMEN JAR SMALL (MISCELLANEOUS) ×3 IMPLANT
SUT VIC AB 4-0 PS2 27 (SUTURE) ×3 IMPLANT
TOWEL OR 17X24 6PK STRL BLUE (TOWEL DISPOSABLE) ×3 IMPLANT
TOWEL OR 17X26 10 PK STRL BLUE (TOWEL DISPOSABLE) ×3 IMPLANT
TRAY LAPAROSCOPIC (CUSTOM PROCEDURE TRAY) ×3 IMPLANT
TROCAR HASSON GELL 12X100 (TROCAR) ×3 IMPLANT
TROCAR Z-THREAD FIOS 11X100 BL (TROCAR) IMPLANT
TROCAR Z-THREAD FIOS 5X100MM (TROCAR) ×3 IMPLANT
WATER STERILE IRR 1000ML POUR (IV SOLUTION) IMPLANT

## 2011-10-31 NOTE — Preoperative (Signed)
Beta Blockers   Reason not to administer Beta Blockers:Not Applicable 

## 2011-10-31 NOTE — Anesthesia Postprocedure Evaluation (Signed)
  Anesthesia Post-op Note  Patient: Ruth [name]  Procedure(s) Performed: Procedure(s) (LRB): LAPAROSCOPIC CHOLECYSTECTOMY (N/A)  Patient Location: PACU  Anesthesia Type: General  Level of Consciousness: awake  Airway and Oxygen Therapy: Patient Spontanous Breathing  Post-op Pain: mild  Post-op Assessment: Post-op Vital signs reviewed  Post-op Vital Signs: Reviewed  Complications: No apparent anesthesia complications

## 2011-10-31 NOTE — Interval H&P Note (Signed)
History and Physical Interval Note:  10/31/2011 8:16 AM  Ruth [name]  has presented today for surgery, with the diagnosis of cholecystitis  The various methods of treatment have been discussed with the patient and family. After consideration of risks, benefits and other options for treatment, the patient has consented to  Procedure(s) (LRB): LAPAROSCOPIC CHOLECYSTECTOMY WITH INTRAOPERATIVE CHOLANGIOGRAM (N/A) as a surgical intervention .  The patients' history has been reviewed, patient re-examined, no change in status, stable for surgery.  I have reviewed the patients' chart and labs.  Questions were answered to the patient's satisfaction.     Trung Wenzl E

## 2011-10-31 NOTE — H&P (Signed)
I have seen and examined this patient. I have discussed with Dr Fara Boros.  I agree with their findings and plans as documented in their admission note.  Acute Issues 1. Acute Cholecystitis, uncomplicated - Acute persistent RUQ pain with radiation into thoracic back, worse with eating.(+) nausea. Onset 2 days prior to admission - Afebrile, WBC 7K, LFTs normal, Tbili normal, Alk Phos normal -  CT abdomen with GB wall thickening, pericholecystic edema, and possible stone in gallbladder neck - Plan:  Empiric antibiotics to prevent perioperative bacteremia and surgical wound infection Analgesia and Antiemetics General Surgery planning Laparoscopic cholecystectomy with intraoperative cholangiogram. Patient is generally healthy with low surgical risk.

## 2011-10-31 NOTE — Op Note (Signed)
10/30/2011 - 10/31/2011  2:52 PM  PATIENT:  Ruth [name]  31 y.o. female  PRE-OPERATIVE DIAGNOSIS:  cholecystitis  POST-OPERATIVE DIAGNOSIS:  cholecystitis  PROCEDURE:  Procedure(s): LAPAROSCOPIC CHOLECYSTECTOMY  SURGEON:  Surgeon(s): Liz Malady, MD  PHYSICIAN ASSISTANT:   ASSISTANTS:   ANESTHESIA:   general  EBL:  Total I/O In: 1500 [I.V.:1500] Out: -   BLOOD ADMINISTERED:none  DRAINS: none   SPECIMEN:  Excision  DISPOSITION OF SPECIMEN:  PATHOLOGY  COUNTS:  YES  DICTATION: Reubin Milan Dictation patient is brought for cholecystectomy. She was admitted with acute cholecystitis. She received IV antibiotics. She was identified in the preop holding area. Informed consent was obtained. Patient was brought to the operating room and general endotracheal anesthesia was administered by the anesthesia staff. Her abdomen was prepped and draped in sterile fashion. We did time out procedure. Infraumbilical region was infiltrated with quarter percent Marcaine with epinephrine infraumbilical incision was made. Subcutaneous tissues were dissected down revealing the anterior fascia. This was divided sharply along the midline. Peritoneal cavity was entered under direct vision. 0 Vicryl pursestring suture was placed on the fascial opening. Hassan trocar was inserted into the abdomen. Abdomen was insufflated with carbon dioxide in standard fashion. Under direct vision, a 5 mm epigastric and 2 5 mm right lateral ports were placed. Local was used at each port site. Laparoscopic exploration revealed a very distended and tense inflamed gallbladder. This was drained with the NAHZAT drainage system. Dark bile was returned. The dome of the gallbladder was retracted superior medially. The infundibulum was covered by only omentum. This was swept away revealing the duodenum stuck on the body of the gallbladder. This was very slowly and meticulously dissected down. It peeled away gradually. There is no  injury to it. This eventually revealed the infundibulum of the gallbladder. Further gentle dissection was done identifying the cystic duct and cystic artery. Cystic duct was short precluding cholangiogram. Dissection continued until critical view was seen. Once we had excellent view of the anatomy, 3 clips were placed proximally on the cystic duct was placed distally and it was divided. the cystic artery was clipped twice proximally and once distally and divided. The gallbladder was gradually taken off the liver bed with Bovie cautery. Excellent hemostasis was obtained. The gallbladder was placed in Endo Catch bag and removed from the abdomen via the infraumbilical port site. Liver bed was cauterized in excellent hemostasis. Irrigation fluid was used liberally. Irrigation fluid returned clear. Liver bed was dry. Clips remain in excellent position. Ports were removed under direct vision. Pneumoperitoneum was released. Infraumbilical fascia was closed by tying the 0 Vicryl pursestring suture. Care was used to to trap any intra-abdominal contents. All 4 wounds were closed with running 4-0 Vicryl subcuticular stitch followed by Dermabond. Sponge needle and as we counts were correct. Patient tolerated procedure well without apparent complications and was taken to recovery in stable condition.  PATIENT DISPOSITION:  PACU - hemodynamically stable.   Delay start of Pharmacological VTE agent (>24hrs) due to surgical blood loss or risk of bleeding:  no  Violeta Gelinas, MD, MPH, FACS Pager: [phone]  4/10/20132:52 PM

## 2011-10-31 NOTE — Progress Notes (Signed)
PGY-1 Daily Progress Note Family Medicine Teaching Service Amber M. Hairford, MD Service Pager: [phone]  Subjective: Patient continues to complain of some abd pain. No nausea, fevers or other complaints.  Objective: Vital signs in last 24 hours: Temp:  [98.1 F (36.7 C)-98.6 F (37 C)] 98.1 F (36.7 C) (04/09 2113) Pulse Rate:  [82-93] 82  (04/09 2113) Resp:  [20] 20  (04/09 2113) BP: (120-129)/(69-87) 129/69 mmHg (04/09 2113) SpO2:  [100 %] 100 % (04/09 2113) Weight:  [185 lb (83.915 kg)] 185 lb (83.915 kg) (04/09 2113) Weight change:  Last BM Date: 10/30/11  Intake/Output from previous day: 04/09 0701 - 04/10 0700 In: 1000 [I.V.:800; IV Piggyback:200] Out: 400 [Urine:400] Intake/Output this shift:   General: alert, cooperative and no distress  HEENT: AT/[place]. Sclera white Heart: no murmur, rub or gallop, regular rate and rhythm  Lungs: clear to auscultation, no wheezes or rales and unlabored breathing  Abdomen: mild tenderness in the in the RUQ, some guarding is present; +BS  Extremities: extremities normal, atraumatic, no cyanosis or edema  Skin: no rashes  Neurology: normal without focal findings   Lab Results:  [facility] 10/30/11 2205 10/28/11 0745  WBC 9.1 7.3  HGB 14.2 14.1  HCT 41.0 42.1  PLT 220 249   BMET  Basename 10/30/11 2205 10/28/11 0745  NA 137 144  K 3.3* 3.5  CL 100 106  CO2 24 27  GLUCOSE 83 98  BUN 7 7  CREATININE 0.75 0.79  CALCIUM 9.2 9.3    Studies/Results: Ct Abdomen Pelvis W Contrast  10/30/2011  *RADIOLOGY REPORT*  Clinical Data: Right upper quadrant abdominal pain for 3 days. Vomiting.  Right lower quadrant pain.  Fever.  Diarrhea for 3 days.  CT ABDOMEN AND PELVIS WITH CONTRAST  Technique:  Multidetector CT imaging of the abdomen and pelvis was performed following the standard protocol during bolus administration of intravenous contrast.  Contrast: OMNIPAQUE IOHEXOL 300 MG/ML  SOLN  Comparison: 10/28/2011 ultrasound.  No  prior CT.  Findings: Clear lung bases.  Normal heart size without pericardial or pleural effusion.  Focal steatosis adjacent the falciform ligament.  Normal spleen, stomach, pancreas.  The gallbladder is inflamed, with wall thickening and pericholecystic edema, including on image 31.  There is likely a stone within the gallbladder neck, 1.3 cm on image 21.  No biliary ductal dilatation.  Normal adrenal glands.  Too small to characterize right renal lesion.  Normal left kidney.  No retroperitoneal or retrocrural adenopathy.  There may be mild secondary inflammation of the hepatic flexure colon, including on image 37.  Normal terminal ileum and appendix.  Normal small bowel.  No pelvic adenopathy.  Normal urinary bladder and uterus.  No adnexal mass.  Small volume cul-de-sac fluid is slightly greater than typically seen physiologically. Slightly increased in density, suggesting minimal complexity.  IMPRESSION:  1.  Acute cholecystitis. I personally called and discussed this report with Roslynn Amble, M.D.   at 7:51 p.m. on 10/30/2011. Possible mild secondary inflammation of the hepatic flexure colon. 2.  Small volume cul-de-sac fluid.  Minimal complexity within. This could be physiologic, or secondary to the abdominal process. Slightly more than typically seen physiologically.  Original Report Authenticated By: Consuello Bossier, M.D.    Medications:  I have reviewed the patient's current medications. Scheduled:   . pantoprazole  40 mg Oral Daily  . piperacillin-tazobactam (ZOSYN)  IV  3.375 g Intravenous Q8H  . DISCONTD: heparin  5,000 Units Subcutaneous Q8H   Continuous:   .  sodium chloride 100 mL/hr at 10/30/11 2349   ZOX:[id number], acetaminophen, morphine injection, ondansetron (ZOFRAN) IV  Assessment/Plan: Ruth [name] is a 31 y.o. year old female presenting with acute cholecystitis   1. Acute cholecystitis: consistent on history, physical, and imaging.Gen Surgery, Dr. Harlon Flor consulted at  admission. we greatly appreciate their consult. Labs within normal limits. WBC 9.1, patient afebrile -Continue Zosyn -NPO for possible surgery today -IV morphine for pain control which seems to be working well at this time -IV zofran for nausea; will switch to po once no longer NPO  -NS @100cc /hr for rehydration given decreased PO intake in recent days  -Will check labs tomorrow AM  2. H/o asthma: pt reports she has not needed any medications in many years  -lungs CTAB at this time. Encourage IS and OOB -no need for albuterol at this time, but will consider adding if she starts wheezing   3. FEN/GI: NPO, Zofran  4. Prophylaxis: SQ heparin (held for surgery. Will restart post-op), protonix   5. Disposition: Pending clinical improvement     LOS: 1 day   HAIRFORD, AMBER 10/31/2011, 7:26 AM

## 2011-10-31 NOTE — Transfer of Care (Signed)
Immediate Anesthesia Transfer of Care Note  Patient: Ruth [name]  Procedure(s) Performed: Procedure(s) (LRB): LAPAROSCOPIC CHOLECYSTECTOMY (N/A)  Patient Location: PACU  Anesthesia Type: General  Level of Consciousness: awake and alert   Airway & Oxygen Therapy: Patient Spontanous Breathing and Patient connected to face mask oxygen  Post-op Assessment: Report given to PACU RN  Post vital signs: Reviewed and stable  Complications: No apparent anesthesia complications

## 2011-10-31 NOTE — Progress Notes (Signed)
PCP visit note This is a patient who has been assigned to me at the [facility] but who I have not seen yet as an outpatient. The patient was in the OR when I stopped by her room this afternoon.   I appreciate the good care being provided by the inpatient team and would be happy for her to follow-up with me as an outpatient.

## 2011-10-31 NOTE — Progress Notes (Signed)
Subjective: Still some pain RUQ  Objective: Vital signs in last 24 hours: Temp:  [98.1 F (36.7 C)-98.6 F (37 C)] 98.6 F (37 C) (04/10 0700) Pulse Rate:  [82-93] 82  (04/10 0700) Resp:  [20] 20  (04/10 0700) BP: (116-129)/(64-87) 116/64 mmHg (04/10 0700) SpO2:  [100 %] 100 % (04/10 0700) Weight:  [83.915 kg (185 lb)] 83.915 kg (185 lb) (04/09 2113) Last BM Date: 10/30/11  Intake/Output from previous day: 04/09 0701 - 04/10 0700 In: 1000 [I.V.:800; IV Piggyback:200] Out: 700 [Urine:700] Intake/Output this shift:    General appearance: alert, cooperative and no distress Resp: clear to auscultation bilaterally Cardio: regular rate and rhythm GI: tender RUQ without guarding  Lab Results:   Basename 10/30/11 2205  WBC 9.1  HGB 14.2  HCT 41.0  PLT 220   BMET  Basename 10/30/11 2205  NA 137  K 3.3*  CL 100  CO2 24  GLUCOSE 83  BUN 7  CREATININE 0.75  CALCIUM 9.2   PT/INR  Basename 10/30/11 2205  LABPROT 14.3  INR 1.09   ABG No results found for this basename: PHART:2,PCO2:2,PO2:2,HCO3:2 in the last 72 hours  Studies/Results: Ct Abdomen Pelvis W Contrast  10/30/2011  *RADIOLOGY REPORT*  Clinical Data: Right upper quadrant abdominal pain for 3 days. Vomiting.  Right lower quadrant pain.  Fever.  Diarrhea for 3 days.  CT ABDOMEN AND PELVIS WITH CONTRAST  Technique:  Multidetector CT imaging of the abdomen and pelvis was performed following the standard protocol during bolus administration of intravenous contrast.  Contrast: OMNIPAQUE IOHEXOL 300 MG/ML  SOLN  Comparison: 10/28/2011 ultrasound.  No prior CT.  Findings: Clear lung bases.  Normal heart size without pericardial or pleural effusion.  Focal steatosis adjacent the falciform ligament.  Normal spleen, stomach, pancreas.  The gallbladder is inflamed, with wall thickening and pericholecystic edema, including on image 31.  There is likely a stone within the gallbladder neck, 1.3 cm on image 21.  No  biliary ductal dilatation.  Normal adrenal glands.  Too small to characterize right renal lesion.  Normal left kidney.  No retroperitoneal or retrocrural adenopathy.  There may be mild secondary inflammation of the hepatic flexure colon, including on image 37.  Normal terminal ileum and appendix.  Normal small bowel.  No pelvic adenopathy.  Normal urinary bladder and uterus.  No adnexal mass.  Small volume cul-de-sac fluid is slightly greater than typically seen physiologically. Slightly increased in density, suggesting minimal complexity.  IMPRESSION:  1.  Acute cholecystitis. I personally called and discussed this report with Roslynn Amble, M.D.   at 7:51 p.m. on 10/30/2011. Possible mild secondary inflammation of the hepatic flexure colon. 2.  Small volume cul-de-sac fluid.  Minimal complexity within. This could be physiologic, or secondary to the abdominal process. Slightly more than typically seen physiologically.  Original Report Authenticated By: Consuello Bossier, M.D.    Anti-infectives: Anti-infectives     Start     Dose/Rate Route Frequency Ordered Stop   10/30/11 2300   piperacillin-tazobactam (ZOSYN) IVPB 3.375 g        3.375 g 12.5 mL/hr over 240 Minutes Intravenous 3 times per day 10/30/11 2215            Assessment/Plan: s/p Procedure(s) (LRB): LAPAROSCOPIC CHOLECYSTECTOMY WITH INTRAOPERATIVE CHOLANGIOGRAM (N/A) Acute cholecystitis - for lap chole/IOC today I discussed the procedure in detail.  The patient was given educational material.  We discussed the risks and benefits of a laparoscopic cholecystectomy and possible cholangiogram including,  but not limited to bleeding, infection, injury to surrounding structures such as the intestine or liver, bile leak, retained gallstones, need to convert to an open procedure, prolonged diarrhea, blood clots such as  DVT, common bile duct injury, anesthesia risks, and possible need for additional procedures.  The likelihood of improvement in  symptoms and return to the patient's normal status is good. We discussed the typical post-operative recovery course.   LOS: 1 day    Quin Mathenia E 10/31/2011

## 2011-10-31 NOTE — Anesthesia Preprocedure Evaluation (Signed)
Anesthesia Evaluation  Patient identified by MRN, date of birth, ID band Patient awake    Reviewed: Allergy & Precautions, H&P , NPO status , Patient's Chart, lab work & pertinent test results  Airway Mallampati: II  Neck ROM: full    Dental   Pulmonary asthma , former smoker         Cardiovascular hypertension,     Neuro/Psych    GI/Hepatic GERD-  ,  Endo/Other    Renal/GU      Musculoskeletal   Abdominal   Peds  Hematology   Anesthesia Other Findings   Reproductive/Obstetrics                           Anesthesia Physical Anesthesia Plan  ASA: II  Anesthesia Plan: General   Post-op Pain Management:    Induction: Intravenous  Airway Management Planned: Oral ETT  Additional Equipment:   Intra-op Plan:   Post-operative Plan: Extubation in OR  Informed Consent: I have reviewed the patients History and Physical, chart, labs and discussed the procedure including the risks, benefits and alternatives for the proposed anesthesia with the patient or authorized representative who has indicated his/her understanding and acceptance.     Plan Discussed with: CRNA and Surgeon  Anesthesia Plan Comments:         Anesthesia Quick Evaluation

## 2011-10-31 NOTE — H&P (View-Only) (Signed)
Subjective: Still some pain RUQ  Objective: Vital signs in last 24 hours: Temp:  [98.1 F (36.7 C)-98.6 F (37 C)] 98.6 F (37 C) (04/10 0700) Pulse Rate:  [82-93] 82  (04/10 0700) Resp:  [20] 20  (04/10 0700) BP: (116-129)/(64-87) 116/64 mmHg (04/10 0700) SpO2:  [100 %] 100 % (04/10 0700) Weight:  [83.915 kg (185 lb)] 83.915 kg (185 lb) (04/09 2113) Last BM Date: 10/30/11  Intake/Output from previous day: 04/09 0701 - 04/10 0700 In: 1000 [I.V.:800; IV Piggyback:200] Out: 700 [Urine:700] Intake/Output this shift:    General appearance: alert, cooperative and no distress Resp: clear to auscultation bilaterally Cardio: regular rate and rhythm GI: tender RUQ without guarding  Lab Results:   Basename 10/30/11 2205  WBC 9.1  HGB 14.2  HCT 41.0  PLT 220   BMET  Basename 10/30/11 2205  NA 137  K 3.3*  CL 100  CO2 24  GLUCOSE 83  BUN 7  CREATININE 0.75  CALCIUM 9.2   PT/INR  Basename 10/30/11 2205  LABPROT 14.3  INR 1.09   ABG No results found for this basename: PHART:2,PCO2:2,PO2:2,HCO3:2 in the last 72 hours  Studies/Results: Ct Abdomen Pelvis W Contrast  10/30/2011  *RADIOLOGY REPORT*  Clinical Data: Right upper quadrant abdominal pain for 3 days. Vomiting.  Right lower quadrant pain.  Fever.  Diarrhea for 3 days.  CT ABDOMEN AND PELVIS WITH CONTRAST  Technique:  Multidetector CT imaging of the abdomen and pelvis was performed following the standard protocol during bolus administration of intravenous contrast.  Contrast: 100mL OMNIPAQUE IOHEXOL 300 MG/ML  SOLN  Comparison: 10/28/2011 ultrasound.  No prior CT.  Findings: Clear lung bases.  Normal heart size without pericardial or pleural effusion.  Focal steatosis adjacent the falciform ligament.  Normal spleen, stomach, pancreas.  The gallbladder is inflamed, with wall thickening and pericholecystic edema, including on image 31.  There is likely a stone within the gallbladder neck, 1.3 cm on image 21.  No  biliary ductal dilatation.  Normal adrenal glands.  Too small to characterize right renal lesion.  Normal left kidney.  No retroperitoneal or retrocrural adenopathy.  There may be mild secondary inflammation of the hepatic flexure colon, including on image 37.  Normal terminal ileum and appendix.  Normal small bowel.  No pelvic adenopathy.  Normal urinary bladder and uterus.  No adnexal mass.  Small volume cul-de-sac fluid is slightly greater than typically seen physiologically. Slightly increased in density, suggesting minimal complexity.  IMPRESSION:  1.  Acute cholecystitis. I personally called and discussed this report with Van Williamson, M.D.   at 7:51 p.m. on 10/30/2011. Possible mild secondary inflammation of the hepatic flexure colon. 2.  Small volume cul-de-sac fluid.  Minimal complexity within. This could be physiologic, or secondary to the abdominal process. Slightly more than typically seen physiologically.  Original Report Authenticated By: KYLE D. TALBOT, M.D.    Anti-infectives: Anti-infectives     Start     Dose/Rate Route Frequency Ordered Stop   10/30/11 2300   piperacillin-tazobactam (ZOSYN) IVPB 3.375 g        3.375 g 12.5 mL/hr over 240 Minutes Intravenous 3 times per day 10/30/11 2215            Assessment/Plan: s/p Procedure(s) (LRB): LAPAROSCOPIC CHOLECYSTECTOMY WITH INTRAOPERATIVE CHOLANGIOGRAM (N/A) Acute cholecystitis - for lap chole/IOC today I discussed the procedure in detail.  The patient was given educational material.  We discussed the risks and benefits of a laparoscopic cholecystectomy and possible cholangiogram including,   but not limited to bleeding, infection, injury to surrounding structures such as the intestine or liver, bile leak, retained gallstones, need to convert to an open procedure, prolonged diarrhea, blood clots such as  DVT, common bile duct injury, anesthesia risks, and possible need for additional procedures.  The likelihood of improvement in  symptoms and return to the patient's normal status is good. We discussed the typical post-operative recovery course.   LOS: 1 day    Tomeshia Pizzi E 10/31/2011  

## 2011-11-01 ENCOUNTER — Encounter (HOSPITAL_COMMUNITY): Payer: Self-pay | Admitting: General Surgery

## 2011-11-01 LAB — CBC
MCH: 29.1 pg (ref 26.0–34.0)
MCHC: 34.1 g/dL (ref 30.0–36.0)
MCV: 85.2 fL (ref 78.0–100.0)
Platelets: 226 10*3/uL (ref 150–400)
RDW: 12.4 % (ref 11.5–15.5)
WBC: 6.7 10*3/uL (ref 4.0–10.5)

## 2011-11-01 LAB — COMPREHENSIVE METABOLIC PANEL
AST: 44 U/L — ABNORMAL HIGH (ref 0–37)
Albumin: 2.7 g/dL — ABNORMAL LOW (ref 3.5–5.2)
Calcium: 8.3 mg/dL — ABNORMAL LOW (ref 8.4–10.5)
Chloride: 106 mEq/L (ref 96–112)
Creatinine, Ser: 0.67 mg/dL (ref 0.50–1.10)
Total Protein: 5.6 g/dL — ABNORMAL LOW (ref 6.0–8.3)

## 2011-11-01 MED ORDER — DIPHENHYDRAMINE HCL 25 MG PO CAPS
25.0000 mg | ORAL_CAPSULE | Freq: Three times a day (TID) | ORAL | Status: DC | PRN
  Administered 2011-11-01: 25 mg via ORAL
  Filled 2011-11-01: qty 1

## 2011-11-01 MED ORDER — KETOROLAC TROMETHAMINE 15 MG/ML IJ SOLN
15.0000 mg | Freq: Four times a day (QID) | INTRAMUSCULAR | Status: DC | PRN
  Administered 2011-11-01: 15 mg via INTRAVENOUS
  Filled 2011-11-01: qty 1

## 2011-11-01 NOTE — Progress Notes (Signed)
Agree Jahmeir Geisen, MD, MPH, FACS Pager: [phone]  

## 2011-11-01 NOTE — Progress Notes (Signed)
FMTS Attending Note  Patient seen and examined by me, I agree with the assessment/plan as documented by Dr Mikel Cella.  Further postop recommendations per general surgery. Paula Compton, MD

## 2011-11-01 NOTE — Progress Notes (Signed)
Patient ID: Ruth [name], female   DOB: 1981/07/23, 31 y.o.   MRN: [id number] 1 Day Post-Op  Subjective: Pt c/o a lot of pain.  She feels no better today.  Ate a little bit this morning.  Objective: Vital signs in last 24 hours: Temp:  [97.8 F (36.6 C)-98.9 F (37.2 C)] 98.1 F (36.7 C) (04/11 0937) Pulse Rate:  [52-78] 61  (04/11 0937) Resp:  [12-22] 18  (04/11 0937) BP: (116-130)/(59-93) 118/65 mmHg (04/11 0937) SpO2:  [98 %-100 %] 100 % (04/11 0937) FiO2 (%):  [92 %] 92 % (04/10 1511) Last BM Date: 10/30/11  Intake/Output from previous day: 04/10 0701 - 04/11 0700 In: 2982.9 [P.O.:120; I.V.:2862.9] Out: 1300 [Urine:1250; Blood:50] Intake/Output this shift: Total I/O In: 360 [P.O.:360] Out: 300 [Urine:300]  PE: Abd: soft, very tender, +BS, ND, incisions c/d/i Heart: regular Lungs: CTAB  Lab Results:   Basename 11/01/11 0640 10/30/11 2205  WBC 6.7 9.1  HGB 12.0 14.2  HCT 35.2* 41.0  PLT 226 220   BMET  Basename 11/01/11 0640 10/30/11 2205  NA 137 137  K 3.6 3.3*  CL 106 100  CO2 22 24  GLUCOSE 119* 83  BUN 5* 7  CREATININE 0.67 0.75  CALCIUM 8.3* 9.2   PT/INR  Basename 10/30/11 2205  LABPROT 14.3  INR 1.09   CMP     Component Value Date/Time   NA 137 11/01/2011 0640   K 3.6 11/01/2011 0640   CL 106 11/01/2011 0640   CO2 22 11/01/2011 0640   GLUCOSE 119* 11/01/2011 0640   BUN 5* 11/01/2011 0640   CREATININE 0.67 11/01/2011 0640   CALCIUM 8.3* 11/01/2011 0640   PROT 5.6* 11/01/2011 0640   ALBUMIN 2.7* 11/01/2011 0640   AST 44* 11/01/2011 0640   ALT 36* 11/01/2011 0640   ALKPHOS 49 11/01/2011 0640   BILITOT 0.3 11/01/2011 0640   GFRNONAA >90 11/01/2011 0640   GFRAA >90 11/01/2011 0640   Lipase     Component Value Date/Time   LIPASE 38 10/28/2011 0745       Studies/Results: Ct Abdomen Pelvis W Contrast  10/30/2011  *RADIOLOGY REPORT*  Clinical Data: Right upper quadrant abdominal pain for 3 days. Vomiting.  Right lower quadrant pain.  Fever.   Diarrhea for 3 days.  CT ABDOMEN AND PELVIS WITH CONTRAST  Technique:  Multidetector CT imaging of the abdomen and pelvis was performed following the standard protocol during bolus administration of intravenous contrast.  Contrast: OMNIPAQUE IOHEXOL 300 MG/ML  SOLN  Comparison: 10/28/2011 ultrasound.  No prior CT.  Findings: Clear lung bases.  Normal heart size without pericardial or pleural effusion.  Focal steatosis adjacent the falciform ligament.  Normal spleen, stomach, pancreas.  The gallbladder is inflamed, with wall thickening and pericholecystic edema, including on image 31.  There is likely a stone within the gallbladder neck, 1.3 cm on image 21.  No biliary ductal dilatation.  Normal adrenal glands.  Too small to characterize right renal lesion.  Normal left kidney.  No retroperitoneal or retrocrural adenopathy.  There may be mild secondary inflammation of the hepatic flexure colon, including on image 37.  Normal terminal ileum and appendix.  Normal small bowel.  No pelvic adenopathy.  Normal urinary bladder and uterus.  No adnexal mass.  Small volume cul-de-sac fluid is slightly greater than typically seen physiologically. Slightly increased in density, suggesting minimal complexity.  IMPRESSION:  1.  Acute cholecystitis. I personally called and discussed this report with Roslynn Amble, M.D.  at 7:51 p.m. on 10/30/2011. Possible mild secondary inflammation of the hepatic flexure colon. 2.  Small volume cul-de-sac fluid.  Minimal complexity within. This could be physiologic, or secondary to the abdominal process. Slightly more than typically seen physiologically.  Original Report Authenticated By: Consuello Bossier, M.D.    Anti-infectives: Anti-infectives     Start     Dose/Rate Route Frequency Ordered Stop   10/30/11 2300   piperacillin-tazobactam (ZOSYN) IVPB 3.375 g  Status:  Discontinued        3.375 g 12.5 mL/hr over 240 Minutes Intravenous 3 times per day 10/30/11 2215 10/31/11 1629             Assessment/Plan  1. S/p lap chole  Plan: 1. Will add toradol today for pain control. 2. Patient needs to ambulate in the halls 3. She will need to stay right now for more pain control.   LOS: 2 days    Marjan Rosman E 11/01/2011

## 2011-11-01 NOTE — Progress Notes (Signed)
PGY-1 Daily Progress Note Family Medicine Teaching Service Amber M. Hairford, MD Service Pager: [phone]  Subjective: Post-op day #1 from lap chole. Patient continues to have pain. Has been OOB to chair. States she feels "gas bubbles" but is not passing gas yet. Tolerating diet.  Objective: Vital signs in last 24 hours: Temp:  [97.8 F (36.6 C)-98.9 F (37.2 C)] 97.8 F (36.6 C) (04/11 0545) Pulse Rate:  [52-78] 54  (04/11 0545) Resp:  [12-22] 18  (04/11 0545) BP: (116-130)/(59-93) 120/77 mmHg (04/11 0545) SpO2:  [98 %-100 %] 100 % (04/11 0545) FiO2 (%):  [92 %] 92 % (04/10 1511) Weight change:  Last BM Date: 10/30/11  Intake/Output from previous day: 04/10 0701 - 04/11 0700 In: 2982.9 [P.O.:120; I.V.:2862.9] Out: 1300 [Urine:1250; Blood:50] Intake/Output this shift:   General: alert, cooperative and mild distress  HEENT: AT/[place]. Sclera white Heart: no murmur, rub or gallop, regular rate and rhythm  Lungs: clear to auscultation, no wheezes or rales and unlabored breathing  Abdomen: mild diffuse tenderness some guarding is present; +BS  Extremities: extremities normal, atraumatic, no cyanosis or edema  Skin: no rashes  Neurology: normal without focal findings   Lab Results:  Basename 10/30/11 2205  WBC 9.1  HGB 14.2  HCT 41.0  PLT 220   BMET  Basename 10/30/11 2205  NA 137  K 3.3*  CL 100  CO2 24  GLUCOSE 83  BUN 7  CREATININE 0.75  CALCIUM 9.2    Studies/Results: Ct Abdomen Pelvis W Contrast  10/30/2011  *RADIOLOGY REPORT*  Clinical Data: Right upper quadrant abdominal pain for 3 days. Vomiting.  Right lower quadrant pain.  Fever.  Diarrhea for 3 days.  CT ABDOMEN AND PELVIS WITH CONTRAST  Technique:  Multidetector CT imaging of the abdomen and pelvis was performed following the standard protocol during bolus administration of intravenous contrast.  Contrast: OMNIPAQUE IOHEXOL 300 MG/ML  SOLN  Comparison: 10/28/2011 ultrasound.  No prior CT.  Findings:  Clear lung bases.  Normal heart size without pericardial or pleural effusion.  Focal steatosis adjacent the falciform ligament.  Normal spleen, stomach, pancreas.  The gallbladder is inflamed, with wall thickening and pericholecystic edema, including on image 31.  There is likely a stone within the gallbladder neck, 1.3 cm on image 21.  No biliary ductal dilatation.  Normal adrenal glands.  Too small to characterize right renal lesion.  Normal left kidney.  No retroperitoneal or retrocrural adenopathy.  There may be mild secondary inflammation of the hepatic flexure colon, including on image 37.  Normal terminal ileum and appendix.  Normal small bowel.  No pelvic adenopathy.  Normal urinary bladder and uterus.  No adnexal mass.  Small volume cul-de-sac fluid is slightly greater than typically seen physiologically. Slightly increased in density, suggesting minimal complexity.  IMPRESSION:  1.  Acute cholecystitis. I personally called and discussed this report with Roslynn Amble, M.D.   at 7:51 p.m. on 10/30/2011. Possible mild secondary inflammation of the hepatic flexure colon. 2.  Small volume cul-de-sac fluid.  Minimal complexity within. This could be physiologic, or secondary to the abdominal process. Slightly more than typically seen physiologically.  Original Report Authenticated By: Consuello Bossier, M.D.    Medications:  I have reviewed the patient's current medications. Scheduled:    . HYDROmorphone      . pantoprazole (PROTONIX) IV  40 mg Intravenous Q24H  . potassium chloride  10 mEq Intravenous Q1 Hr x 2  . DISCONTD: pantoprazole  40 mg Oral  Daily  . DISCONTD: piperacillin-tazobactam (ZOSYN)  IV  3.375 g Intravenous Q8H   Continuous:    . sodium chloride 100 mL/hr at 11/01/11 0350  . DISCONTD: lactated ringers     WJX:BJYNWGNFAOZHY, acetaminophen, alum & mag hydroxide-simeth, morphine injection, ondansetron (ZOFRAN) IV, oxyCODONE-acetaminophen, DISCONTD: 0.9 % irrigation (POUR BTL),  DISCONTD: bupivacaine-EPINEPHrine, DISCONTD: HYDROmorphone, DISCONTD: ondansetron (ZOFRAN) IV, DISCONTD: sodium chloride irrigation  Assessment/Plan: AMOY STEEVES is a 31 y.o. year old female presenting with acute cholecystitis   1. Acute cholecystitis: Post Op day #1. Acute cholecystitis at admission consistent on history, physical, and imaging. Gen Surgery, Dr. Harlon Flor consulted at admission. We greatly appreciate their consult. Labs within normal limits. WBC 9.1, patient afebrile. Had laparoscopic cholecystectomy on 10/31/11 and tolerated the procedure well. -On Zosyn pre-op; discontinued at time of surgery. -IV morphine for pain control as well as percocet. Will encourage patient to take PO pain medication and get OOB as tolerated. -Zofran for nausea prn -NS @100cc /hr for rehydration given decreased PO intake in recent days. Now tolerating diet.  2. H/o asthma: pt reports she has not needed any medications in many years  -lungs CTAB at this time. Encourage IS and OOB -no need for albuterol at this time, but will consider adding if she starts wheezing   3. FEN/GI: NPO, Zofran  4. Prophylaxis: SQ heparin, protonix   5. Disposition: Pending further clinical improvement post operatively. Will follow up surgery recommendations     LOS: 2 days   HAIRFORD, AMBER 11/01/2011, 7:22 AM

## 2011-11-02 LAB — COMPREHENSIVE METABOLIC PANEL
CO2: 26 mEq/L (ref 19–32)
Calcium: 8.5 mg/dL (ref 8.4–10.5)
Chloride: 106 mEq/L (ref 96–112)
Creatinine, Ser: 0.69 mg/dL (ref 0.50–1.10)
GFR calc Af Amer: >90 mL/min (ref >90)
GFR calc non Af Amer: >90 mL/min (ref >90)
Glucose, Bld: 83 mg/dL (ref 70–99)
Total Bilirubin: 0.1 mg/dL — ABNORMAL LOW (ref 0.3–1.2)

## 2011-11-02 LAB — CBC
HCT: 33.4 % — ABNORMAL LOW (ref 36.0–46.0)
Hemoglobin: 11.1 g/dL — ABNORMAL LOW (ref 12.0–15.0)
MCH: 28.3 pg (ref 26.0–34.0)
MCV: 85.2 fL (ref 78.0–100.0)
RBC: 3.92 MIL/uL (ref 3.87–5.11)

## 2011-11-02 MED ORDER — ONDANSETRON HCL 4 MG PO TABS: 4.0000 mg | ORAL_TABLET | Freq: Three times a day (TID) | ORAL | Status: AC | PRN

## 2011-11-02 MED ORDER — SIMETHICONE 80 MG PO CHEW: 80.0000 mg | CHEWABLE_TABLET | Freq: Four times a day (QID) | ORAL | Status: DC | PRN

## 2011-11-02 MED ORDER — ONDANSETRON HCL 4 MG PO TABS: 4.0000 mg | ORAL_TABLET | Freq: Three times a day (TID) | ORAL | Status: DC | PRN

## 2011-11-02 MED ORDER — CLOTRIMAZOLE 1 % VA CREA: 1.0000 | TOPICAL_CREAM | Freq: Every day | VAGINAL | Status: AC

## 2011-11-02 MED ORDER — CLOTRIMAZOLE 1 % VA CREA
1.0000 | TOPICAL_CREAM | Freq: Every day | VAGINAL | Status: DC
Start: 2011-11-02 — End: 2011-11-02
  Administered 2011-11-02: 1 via VAGINAL
  Filled 2011-11-02: qty 45

## 2011-11-02 MED ORDER — SIMETHICONE 80 MG PO CHEW
80.0000 mg | CHEWABLE_TABLET | Freq: Four times a day (QID) | ORAL | Status: DC | PRN
  Filled 2011-11-02: qty 1

## 2011-11-02 MED ORDER — OMEPRAZOLE 40 MG PO CPDR: 40.0000 mg | DELAYED_RELEASE_CAPSULE | Freq: Every day | ORAL | Status: DC

## 2011-11-02 MED ORDER — CLOTRIMAZOLE 1 % VA CREA
1.0000 | TOPICAL_CREAM | Freq: Every day | VAGINAL | Status: DC
  Filled 2011-11-02: qty 45

## 2011-11-02 MED ORDER — OXYCODONE-ACETAMINOPHEN 5-325 MG PO TABS: 1.0000 | ORAL_TABLET | ORAL | Status: AC | PRN

## 2011-11-02 MED ORDER — PANTOPRAZOLE SODIUM 40 MG PO TBEC: 40.0000 mg | DELAYED_RELEASE_TABLET | Freq: Two times a day (BID) | ORAL | Status: DC

## 2011-11-02 NOTE — Discharge Summary (Signed)
Physician Discharge Summary  Patient ID: Ruth [name] MRN: [id number] DOB/AGE: 1980-10-31 31 y.o.  Admit date: 10/30/2011 Discharge date: 11/02/2011  Admission Diagnoses: Abdominal Pain  Discharge Diagnoses:  Active Problems:  Acute cholecystitis   Discharged Condition: stable  Hospital Course: Ruth [name] is a 31 y.o. year old female presenting with acute cholecystitis on 10/30/11. Diagnosis of acute cholecystitis at admission consistent on history, physical, and imaging. Gen Surgery, Dr. Harlon Flor consulted at admission. Labs within normal limits. WBC 9.1, patient afebrile. Had laparoscopic cholecystectomy on 10/31/11 and tolerated the procedure well. Patient was on Zosyn pre-op; discontinued at time of surgery. Patient required IV morphine for pain control as well as percocet, and Zofran for nausea prn. Was on NS @100cc /hr at admission for rehydration given decreased PO intake in recent days, but patient tolerated full diet well post-op. Patient was ambulating, tolerating oral medications, eating well, and had bowel movement therefore she was stable for discharge on Post-Op day #2.  Patient has a h/o asthma but pt reports she has not needed any medications in many years. Lungs CTAB during hospitalization and did not require albuterol. Also, patient was complaining of vaginal itching and was started on Monistat, which she will continue as an outpatient. Patient also requested a Social Work consult for resources at home. She recently moved and does not have any beds for her or her 4 children to sleep on. Theresia Bough, CSW saw patient here, and can continue to assist her at the [facility] as needed.  Patient will follow up with Dr. Gwendolyn Grant at [facility], as well as [facility]. Discharged home in stable medical condition.   Consults: general surgery  Significant Diagnostic Studies:  CBC    Component Value Date/Time   WBC 5.5 11/02/2011 0426   RBC 3.92 11/02/2011 0426   HGB 11.1*  11/02/2011 0426   HCT 33.4* 11/02/2011 0426   PLT 219 11/02/2011 0426   MCV 85.2 11/02/2011 0426   MCH 28.3 11/02/2011 0426   MCHC 33.2 11/02/2011 0426   RDW 12.6 11/02/2011 0426   LYMPHSABS 2.5 10/30/2011 2205   MONOABS 1.0 10/30/2011 2205   EOSABS 0.1 10/30/2011 2205   BASOSABS 0.0 10/30/2011 2205    US Abdomen Complete 10/28/2011   IMPRESSION:  1.  Mild distended gallbladder without evidence of gallstones.  No sonographic Murphy's sign. 2.  Normal CBD. 3.  No hydronephrosis or diagnostic renal calculus.     Ct Abdomen Pelvis W Contrast 10/30/2011   IMPRESSION:  1.  Acute cholecystitis. I personally called and discussed this report with Roslynn Amble, M.D.   at 7:51 p.m. on 10/30/2011. Possible mild secondary inflammation of the hepatic flexure colon. 2.  Small volume cul-de-sac fluid.  Minimal complexity within. This could be physiologic, or secondary to the abdominal process. Slightly more than typically seen physiologically.     Treatments: IV hydration, antibiotics: Zosyn and surgery: Laparoscopic Cholecystectomy on 10/31/11  Discharge Exam: Blood pressure 95/61, pulse 90, temperature 98.1 F (36.7 C), temperature source Oral, resp. rate 18, height 5' 6.14" (1.68 m), weight 185 lb (83.915 kg), SpO2 100.00%. General: alert, cooperative and mild distress  HEENT: AT/[place]. Sclera white  Heart: no murmur, rub or gallop, regular rate and rhythm  Lungs: clear to auscultation, no wheezes or rales and unlabored breathing  Abdomen: mild diffuse tenderness some guarding is present; +BS  Extremities: extremities normal, atraumatic, no cyanosis or edema  Skin: no rashes  Neurology: normal without focal findings  Recommendations for follow-up: -  Pain control - Social situation (does she need CSW for further resources) - Labs stable at discharge; no need to trend unless clinically concerned  Disposition: 01-Home or Self Care  Discharge Orders    Future Appointments: Provider: Department: Dept Phone:  Center:   11/09/2011 1:45 PM Shelly Rubenstein, MD [facility] Res[facility] [phone] [facility]     Medication List  As of 11/02/2011 10:21 AM   TAKE these medications         clotrimazole 1 % vaginal cream   Commonly known as: GYNE-LOTRIMIN   Place 1 Applicatorful vaginally at bedtime.      diphenhydramine-acetaminophen 25-500 MG Tabs   Commonly known as: TYLENOL PM   Take 4 tablets by mouth at bedtime as needed. To help sleep.      medroxyPROGESTERone 104 MG/0.65ML injection   Commonly known as: DEPO-SUBQ PROVERA   Inject 104 mg into the skin every 3 (three) months.      omeprazole 40 MG capsule   Commonly known as: PRILOSEC   Take 1 capsule (40 mg total) by mouth daily.      ondansetron 4 MG tablet   Commonly known as: ZOFRAN   Take 1 tablet (4 mg total) by mouth every 8 (eight) hours as needed.      oxyCODONE-acetaminophen 5-325 MG per tablet   Commonly known as: PERCOCET   Take 1-2 tablets by mouth every 4 (four) hours as needed.      simethicone 80 MG chewable tablet   Commonly known as: MYLICON   Chew 1 tablet (80 mg total) by mouth every 6 (six) hours as needed for flatulence.           Follow-up Information    Follow up with [facility], Marylene Land, MD on 11/09/2011. (at 1:45pm)    Contact information:   [address] [place] [address] [phone]          Signed: Rodman Pickle 11/02/2011, 10:21 AM

## 2011-11-02 NOTE — Discharge Summary (Signed)
FMTS Attending Note  Patient seen and examined by me today, she is tolerating breakfast and reports reduction in abdominal pain.  Abdomen soft, nontender.  Plans for discharge home today; consider stool softener to help offset opioid-induced constipation.  Paula Compton, MD

## 2011-11-02 NOTE — Progress Notes (Signed)
Clinical Social Work Department BRIEF PSYCHOSOCIAL ASSESSMENT 11/02/2011  Patient:  Ruth [name], Ruth [name]     Account Number:  [id number]     Admit date:  10/30/2011  Clinical Social Worker:  Lourdes Sledge  Date/Time:  11/02/2011 02:42 PM  Referred by:  Physician  Date Referred:  11/02/2011 Referred for  Psychosocial assessment   Other Referral:   Pt needing resources for furniture.   Interview type:   Other interview type:    PSYCHOSOCIAL DATA Living Status:  FAMILY Admitted from facility:   Level of care:   Primary support name:  Leverne Humbles Primary support relationship to patient:  FAMILY Degree of support available:   Pt has family nearby that provides support to pt. Pt reports being very independent and typically trying to do things on her own without asking others for help.    CURRENT CONCERNS Current Concerns  Financial Resources   Other Concerns:   Pt and children have been sleeping on the floor as they do not have any beds.    SOCIAL WORK ASSESSMENT / PLAN CSW visited pt room and assessed pt needs. Pt reports moving into her new home with 4 children after leaving her sisters home. Pt states she moved to her new home and does not have any beds for her children as the beds they had had bed bugs. CSW provided pt with community resources and connected pt with the social worker at [employer] where her children attend. The social worker will refer pt to [employer] who will provde pt with beds and furniture for her children. Pt stated she had no additional concerns.   Assessment/plan status:  No Further Intervention Required Other assessment/ plan:   Information/referral to community resources:   CSW provided pt with resources to [employer], [employer], [employer], [employer] for [employer] and [employer].    PATIENT'S/FAMILY'S RESPONSE TO PLAN OF CARE: Pt was alert, oriented and pleasant to speak to.Pt stated she would be dc'ing from the hospital to her  home today. Pt explained that she has recently moved to a new home and is need of furniture. After resources were given and a referral to Dominica Severin was made through the [facility] school social worker pt stated was very Adult nurse of CSW resources and would follow up with Child psychotherapist at [employer] on Monday. No further CSW needs addressed.

## 2011-11-02 NOTE — Discharge Instructions (Signed)
[facility] ______[facility] °LAPAROSCOPIC SURGERY: POST OP INSTRUCTIONS °Always review your discharge instruction sheet given to you by the facility where your surgery was performed. °IF YOU HAVE DISABILITY OR FAMILY LEAVE FORMS, YOU MUST BRING THEM TO THE OFFICE FOR PROCESSING.   °DO NOT GIVE THEM TO YOUR DOCTOR. ° °1. A prescription for pain medication may be given to you upon discharge.  Take your pain medication as prescribed, if needed.  If narcotic pain medicine is not needed, then you may take acetaminophen (Tylenol) or ibuprofen (Advil) as needed. °2. Take your usually prescribed medications unless otherwise directed. °3. If you need a refill on your pain medication, please contact your pharmacy.  They will contact our office to request authorization. Prescriptions will not be filled after 5pm or on week-ends. °4. You should follow a light diet the first few days after arrival home, such as soup and crackers, etc.  Be sure to include lots of fluids daily. °5. Most patients will experience some swelling and bruising in the area of the incisions.  Ice packs will help.  Swelling and bruising can take several days to resolve.  °6. It is common to experience some constipation if taking pain medication after surgery.  Increasing fluid intake and taking a stool softener (such as Colace) will usually help or prevent this problem from occurring.  A mild laxative (Milk of Magnesia or Miralax) should be taken according to package instructions if there are no bowel movements after 48 hours. °7. Unless discharge instructions indicate otherwise, you may remove your bandages 24-48 hours after surgery, and you may shower at that time.  You may have steri-strips (small skin tapes) in place directly over the incision.  These strips should be left on the skin for 7-10 days.  If your surgeon used skin glue on the incision, you may shower in 24 hours.  The glue will flake off over the next 2-3 weeks.  Any sutures or  staples will be removed at the office during your follow-up visit. °8. ACTIVITIES:  You may resume regular (light) daily activities beginning the next day--such as daily self-care, walking, climbing stairs--gradually increasing activities as tolerated.  You may have sexual intercourse when it is comfortable.  Refrain from any heavy lifting or straining until approved by your doctor. °a. You may drive when you are no longer taking prescription pain medication, you can comfortably wear a seatbelt, and you can safely maneuver your car and apply brakes. °b. RETURN TO WORK:  __________________________________________________________ °9. You should see your doctor in the office for a follow-up appointment approximately 2-3 weeks after your surgery.  Make sure that you call for this appointment within a day or two after you arrive home to insure a convenient appointment time. °10. OTHER INSTRUCTIONS: __________________________________________________________________________________________________________________________ __________________________________________________________________________________________________________________________ °WHEN TO CALL YOUR DOCTOR: °1. Fever over 101.0 °2. Inability to urinate °3. Continued bleeding from incision. °4. Increased pain, redness, or drainage from the incision. °5. Increasing abdominal pain ° °The clinic staff is available to answer your questions during regular business hours.  Please don’t hesitate to call and ask to speak to one of the nurses for clinical concerns.  If you have a medical emergency, go to the nearest emergency room or call 911.  A surgeon from [facility] is always on call at the hospital. °[address], [address], [place], [place]  [address] ? [address] [address], [place], [place]   [address] °[phone] ? [phone] ? FAX [phone] °Web site:   [email or web address] °

## 2011-11-02 NOTE — Progress Notes (Signed)
DISCHARGED TO HOME AS ORDERED, DISCHARGE INSTRUCTIONS GIVEN AND VERBALIZED INSTRUCTIONS

## 2011-11-02 NOTE — Progress Notes (Signed)
Akasha Melena, MD, MPH, FACS Pager: [phone]  

## 2011-11-02 NOTE — Progress Notes (Signed)
Patient ID: Ruth [name], female   DOB: 1980/08/19, 31 y.o.   MRN: [id number] 2 Days Post-Op  Subjective: Feels a little better today, but still with some pain.  Eating better.  Objective: Vital signs in last 24 hours: Temp:  [98.1 F (36.7 C)-98.5 F (36.9 C)] 98.1 F (36.7 C) (04/12 0601) Pulse Rate:  [76-90] 90  (04/12 0601) Resp:  [18-20] 18  (04/12 0601) BP: (95-128)/(60-83) 95/61 mmHg (04/12 0601) SpO2:  [100 %] 100 % (04/12 0601) Last BM Date: 10/30/11  Intake/Output from previous day: 04/11 0701 - 04/12 0700 In: 1425 [P.O.:600; I.V.:825] Out: 300 [Urine:300] Intake/Output this shift:    PE: Abd: soft, tender, but less so than yesterday, +BS, ND, incisions c/d/i  Lab Results:   Basename 11/02/11 0426 11/01/11 0640  WBC 5.5 6.7  HGB 11.1* 12.0  HCT 33.4* 35.2*  PLT 219 226   BMET  Basename 11/02/11 0426 11/01/11 0640  NA 139 137  K 3.6 3.6  CL 106 106  CO2 26 22  GLUCOSE 83 119*  BUN 4* 5*  CREATININE 0.69 0.67  CALCIUM 8.5 8.3*   PT/INR  Basename 10/30/11 2205  LABPROT 14.3  INR 1.09   CMP     Component Value Date/Time   NA 139 11/02/2011 0426   K 3.6 11/02/2011 0426   CL 106 11/02/2011 0426   CO2 26 11/02/2011 0426   GLUCOSE 83 11/02/2011 0426   BUN 4* 11/02/2011 0426   CREATININE 0.69 11/02/2011 0426   CALCIUM 8.5 11/02/2011 0426   PROT 5.6* 11/02/2011 0426   ALBUMIN 2.7* 11/02/2011 0426   AST 36 11/02/2011 0426   ALT 37* 11/02/2011 0426   ALKPHOS 49 11/02/2011 0426   BILITOT 0.1* 11/02/2011 0426   GFRNONAA >90 11/02/2011 0426   GFRAA >90 11/02/2011 0426   Lipase     Component Value Date/Time   LIPASE 38 10/28/2011 0745       Studies/Results: No results found.  Anti-infectives: Anti-infectives     Start     Dose/Rate Route Frequency Ordered Stop   10/30/11 2300   piperacillin-tazobactam (ZOSYN) IVPB 3.375 g  Status:  Discontinued        3.375 g 12.5 mL/hr over 240 Minutes Intravenous 3 times per day 10/30/11 2215 10/31/11 1629           Assessment/Plan  1. S/p lap chole  Plan: 1. Labs look good.  Pain is better controlled today.  She is surgically stable for discharge home.  Follow up in 2-3 weeks.   LOS: 3 days    Seleta Hovland E 11/02/2011

## 2011-11-02 NOTE — Progress Notes (Signed)
Clinical Child psychotherapist (CSW) received referral to assist pt in finding furniture/bedding in her home as pt has no beds for her and her children. CSW provided pt with community resources and connected pt with the [employer] and spatial designer for a referral to [employer]. CSW and pt contacted the social worker and social worker will visit pt home Monday and make direct referral for furniture. Pt very pleased and appreciative of CSW assistance.   CSW to include a complete psychosocial assessment- pt to possibly dc today.  Theresia Bough, MSW, Theresia Majors [phone]

## 2011-11-09 ENCOUNTER — Ambulatory Visit (INDEPENDENT_AMBULATORY_CARE_PROVIDER_SITE_OTHER): Payer: Medicaid Other | Admitting: Family Medicine

## 2011-11-09 ENCOUNTER — Encounter: Payer: Self-pay | Admitting: Family Medicine

## 2011-11-09 VITALS — BP 125/80 | HR 97 | Ht 66.0 in | Wt 185.0 lb

## 2011-11-09 DIAGNOSIS — K5900 Constipation, unspecified: Secondary | ICD-10-CM | POA: Insufficient documentation

## 2011-11-09 DIAGNOSIS — K810 Acute cholecystitis: Secondary | ICD-10-CM

## 2011-11-09 DIAGNOSIS — R7611 Nonspecific reaction to tuberculin skin test without active tuberculosis: Secondary | ICD-10-CM

## 2011-11-09 NOTE — Assessment & Plan Note (Signed)
Hospital follow-up visit today.  Doing well. No complaints. Advised Tylenol/ibuprofen prn pain due to constipation. Patient not need percocet.  She will follow-up with surgery in 2-3 weeks.

## 2011-11-09 NOTE — Assessment & Plan Note (Signed)
Following cholecystectomy.  Doing well with OTC [employer] brand laxative. May continue prn. Also recommended hydration and high-fiber diet. Anticipate improvement as patient becomes more active following procedure; patient sore right now.

## 2011-11-09 NOTE — Progress Notes (Signed)
  Subjective:    Patient ID: Ruth [name], female    DOB: May 10, 1981, 31 y.o.   MRN: [id number]  HPI Patient here for follow-up of hospitalization 04/09-04/12 for acute cholecystitis s/p cholecystectomy.  Patient has been doing well since discharge. Occasional pain in the left side of abdomen with cough or other Valsalva maneuver. She has not been needed percocet.  2. Constipation She has been needed OTC laxative ([employer] brand) every other day and having BM every other day.   Review of Systems Denies fevers/chills Denies nausea/vomiting    Objective:   Physical Exam Gen: NAD; accompanied by toddler daughter Abd: 2in abdominal incisions x3 glued and healing well without tenderness, erythema, warmth; abdomen soft and NT    Assessment & Plan:

## 2011-11-09 NOTE — Patient Instructions (Signed)
Follow-up as needed or for your next physical exam in 1 year.   Try the following to help with constipation: -avoid narcotics -as you become more active, it will help with constipation   Constipation in Adults Constipation is having fewer than 2 bowel movements per week. Usually, the stools are hard. As we grow older, constipation is more common. If you try to fix constipation with laxatives, the problem may get worse. This is because laxatives taken over a long period of time make the colon muscles weaker. A low-fiber diet, not taking in enough fluids, and taking some medicines may make these problems worse. MEDICATIONS THAT MAY CAUSE CONSTIPATION  Water pills (diuretics).   Calcium channel blockers (used to control blood pressure and for the heart).   Certain pain medicines (narcotics).   Anticholinergics.   Anti-inflammatory agents.   Antacids that contain aluminum.  DISEASES THAT CONTRIBUTE TO CONSTIPATION  Diabetes.   Parkinson's disease.   Dementia.   Stroke.   Depression.   Illnesses that cause problems with salt and water metabolism.  HOME CARE INSTRUCTIONS   Constipation is usually best cared for without medicines. Increasing dietary fiber and eating more fruits and vegetables is the best way to manage constipation.   Slowly increase fiber intake to 25 to 38 grams per day. Whole grains, fruits, vegetables, and legumes are good sources of fiber. A dietitian can further help you incorporate high-fiber foods into your diet.   Drink enough water and fluids to keep your urine clear or pale yellow.   A fiber supplement may be added to your diet if you cannot get enough fiber from foods.   Increasing your activities also helps improve regularity.   Suppositories, as suggested by your caregiver, will also help. If you are using antacids, such as aluminum or calcium containing products, it will be helpful to switch to products containing magnesium if your caregiver says  it is okay.   If you have been given a liquid injection (enema) today, this is only a temporary measure. It should not be relied on for treatment of longstanding (chronic) constipation.   Stronger measures, such as magnesium sulfate, should be avoided if possible. This may cause uncontrollable diarrhea. Using magnesium sulfate may not allow you time to make it to the bathroom.  SEEK IMMEDIATE MEDICAL CARE IF:   There is bright red blood in the stool.   The constipation stays for more than 4 days.   There is belly (abdominal) or rectal pain.   You do not seem to be getting better.   You have any questions or concerns.  MAKE SURE YOU:   Understand these instructions.   Will watch your condition.   Will get help right away if you are not doing well or get worse.  Document Released: 04/06/2004 Document Revised: 06/28/2011 Document Reviewed: 06/12/2011 [facility] Patient Information 2012 [place], [place].  High Fiber Diet A high fiber diet changes your normal diet to include more whole grains, legumes, fruits, and vegetables. Changes in the diet involve replacing refined carbohydrates with unrefined foods. The calorie level of the diet is essentially unchanged. The Dietary Reference Intake (recommended amount) for adult males is 38 g per day. For adult females, it is 25 g per day. Pregnant and lactating women should consume 28 g of fiber per day. Fiber is the intact part of a plant that is not broken down during digestion. Functional fiber is fiber that has been isolated from the plant to provide a beneficial effect  in the body. PURPOSE  Increase stool bulk.   Ease and regulate bowel movements.   Lower cholesterol.  INDICATIONS THAT YOU NEED MORE FIBER  Constipation and hemorrhoids.   Uncomplicated diverticulosis (intestine condition) and irritable bowel syndrome.   Weight management.   As a protective measure against hardening of the arteries (atherosclerosis), diabetes, and  cancer.  NOTE OF CAUTION If you have a digestive or bowel problem, ask your caregiver for advice before adding high fiber foods to your diet. Some of the following medical problems are such that a high fiber diet should not be used without consulting your caregiver:  Acute diverticulitis (intestine infection).   Partial small bowel obstructions.   Complicated diverticular disease involving bleeding, rupture (perforation), or abscess (boil, furuncle).   Presence of autonomic neuropathy (nerve damage) or gastric paresis (stomach cannot empty itself).  GUIDELINES FOR INCREASING FIBER  Start adding fiber to the diet slowly. A gradual increase of about 5 more grams (2 slices of whole-wheat bread, 2 servings of most fruits or vegetables, or 1 bowl of high fiber cereal) per day is best. Too rapid an increase in fiber may result in constipation, flatulence, and bloating.   Drink enough water and fluids to keep your urine clear or pale yellow. Water, juice, or caffeine-free drinks are recommended. Not drinking enough fluid may cause constipation.   Eat a variety of high fiber foods rather than one type of fiber.   Try to increase your intake of fiber through using high fiber foods rather than fiber pills or supplements that contain small amounts of fiber.   The goal is to change the types of food eaten. Do not supplement your present diet with high fiber foods, but replace foods in your present diet.  INCLUDE A VARIETY OF FIBER SOURCES  Replace refined and processed grains with whole grains, canned fruits with fresh fruits, and incorporate other fiber sources. White rice, white breads, and most bakery goods contain little or no fiber.   [place] whole-grain rice, buckwheat oats, and many fruits and vegetables are all good sources of fiber. These include: broccoli, Brussels sprouts, cabbage, cauliflower, beets, sweet potatoes, white potatoes (skin on), carrots, tomatoes, eggplant, squash, berries, fresh  fruits, and dried fruits.   Cereals appear to be the richest source of fiber. Cereal fiber is found in whole grains and bran. Bran is the fiber-rich outer coat of cereal grain, which is largely removed in refining. In whole-grain cereals, the bran remains. In breakfast cereals, the largest amount of fiber is found in those with "bran" in their names. The fiber content is sometimes indicated on the label.   You may need to include additional fruits and vegetables each day.   In baking, for 1 cup white flour, you may use the following substitutions:   1 cup whole-wheat flour minus 2 tbs.    cup white flour plus  cup whole-wheat flour.  Document Released: 07/09/2005 Document Revised: 06/28/2011 Document Reviewed: 05/17/2009 [facility] Patient Information 2012 [place], [place].

## 2012-01-03 ENCOUNTER — Ambulatory Visit: Payer: Self-pay

## 2012-01-20 ENCOUNTER — Emergency Department (HOSPITAL_COMMUNITY)
Admission: EM | Admit: 2012-01-20 | Discharge: 2012-01-20 | Disposition: A | Discharge status: Home / Self Care | Payer: Medicaid Other | Attending: Emergency Medicine | Admitting: Emergency Medicine

## 2012-01-20 ENCOUNTER — Emergency Department (HOSPITAL_COMMUNITY): Payer: Medicaid Other

## 2012-01-20 ENCOUNTER — Encounter (HOSPITAL_COMMUNITY): Payer: Self-pay | Admitting: Emergency Medicine

## 2012-01-20 DIAGNOSIS — M25579 Pain in unspecified ankle and joints of unspecified foot: Secondary | ICD-10-CM | POA: Insufficient documentation

## 2012-01-20 DIAGNOSIS — J45909 Unspecified asthma, uncomplicated: Secondary | ICD-10-CM | POA: Insufficient documentation

## 2012-01-20 DIAGNOSIS — Z79899 Other long term (current) drug therapy: Secondary | ICD-10-CM | POA: Insufficient documentation

## 2012-01-20 DIAGNOSIS — M25562 Pain in left knee: Secondary | ICD-10-CM

## 2012-01-20 DIAGNOSIS — I10 Essential (primary) hypertension: Secondary | ICD-10-CM | POA: Insufficient documentation

## 2012-01-20 DIAGNOSIS — M25462 Effusion, left knee: Secondary | ICD-10-CM

## 2012-01-20 DIAGNOSIS — Z87891 Personal history of nicotine dependence: Secondary | ICD-10-CM | POA: Insufficient documentation

## 2012-01-20 DIAGNOSIS — K219 Gastro-esophageal reflux disease without esophagitis: Secondary | ICD-10-CM | POA: Insufficient documentation

## 2012-01-20 MED ORDER — KETOROLAC TROMETHAMINE 60 MG/2ML IM SOLN
60.0000 mg | Freq: Once | INTRAMUSCULAR | Status: AC
  Administered 2012-01-20: 60 mg via INTRAMUSCULAR
  Filled 2012-01-20: qty 2

## 2012-01-20 MED ORDER — OXYCODONE-ACETAMINOPHEN 5-325 MG PO TABS: 1.0000 | ORAL_TABLET | Freq: Four times a day (QID) | ORAL | Status: AC | PRN
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
## 2012-01-20 NOTE — Progress Notes (Signed)
Orthopedic Tech Progress Note Patient Details:  Ruth [name] 06-Jan-1981 [id number]  Ortho Devices Type of Ortho Device: Knee Sleeve Ortho Device/Splint Interventions: Application   Cammer, Mickie Bail 01/20/2012, 11:16 AM

## 2012-01-20 NOTE — Discharge Instructions (Signed)

## 2012-01-20 NOTE — ED Provider Notes (Signed)
History     CSN: [id number]  Arrival date & time 01/20/12  0909   First MD Initiated Contact with Patient 01/20/12 [id number]      Chief Complaint  Patient presents with  . Knee Pain    left knee    (Consider location/radiation/quality/duration/timing/severity/associated sxs/prior treatment) Patient is a 31 y.o. female presenting with knee pain. The history is provided by the patient.  Knee Pain This is a recurrent problem. The current episode started more than 1 week ago. The problem occurs constantly. The problem has been gradually worsening. Pertinent negatives include no chest pain, no abdominal pain and no shortness of breath. Associated symptoms comments: Pain and swelling around the left knee worse with. The symptoms are aggravated by walking. The symptoms are relieved by ice and relaxation. Treatments tried: Ibuprofen. The treatment provided mild relief.    Past Medical History  Diagnosis Date  . Asthma   . GERD (gastroesophageal reflux disease)   . Hypertension during pregnancy    Past Surgical History  Procedure Date  . Dilation and curettage of uterus   . Cholecystectomy 10/31/2011    Procedure: LAPAROSCOPIC CHOLECYSTECTOMY;  Surgeon: Liz Malady, MD;  Location: [facility] OR;  Service: General;  Laterality: N/A;  . Wisdom tooth extraction     History reviewed. No pertinent family history.  History  Substance Use Topics  . Smoking status: Former Smoker    Types: Cigars  . Smokeless tobacco: Not on file  . Alcohol Use: No    OB History    Grav Para Term Preterm Abortions TAB SAB Ect Mult Living                  Review of Systems  Constitutional: Negative for fever.  Respiratory: Negative for shortness of breath.   Cardiovascular: Negative for chest pain.  Gastrointestinal: Negative for abdominal pain.  All other systems reviewed and are negative.    Allergies  Hydrocodone-acetaminophen  Home Medications   Current Outpatient Rx  Name Route Sig  Dispense Refill  . IBUPROFEN 200 MG PO TABS Oral Take 600 mg by mouth every 6 (six) hours as needed. For pain    . SIMETHICONE 80 MG PO CHEW Oral Chew 1 tablet (80 mg total) by mouth every 6 (six) hours as needed for flatulence. 30 tablet 0    BP 121/79  Pulse 90  Temp 98.5 F (36.9 C) (Oral)  Resp 17  SpO2 99%  Physical Exam  Nursing note and vitals reviewed. Constitutional: She is oriented to person, place, and time. She appears well-developed and well-nourished. No distress.  HENT:  Head: Normocephalic and atraumatic.  Eyes: EOM are normal. Pupils are equal, round, and reactive to light.  Musculoskeletal:       Left knee: She exhibits effusion. She exhibits normal range of motion, no ecchymosis, no deformity, no erythema, normal alignment, no LCL laxity and no MCL laxity. tenderness found. Medial joint line and lateral joint line tenderness noted.  Neurological: She is alert and oriented to person, place, and time.  Skin: Skin is warm and dry. No rash noted. No erythema.  Psychiatric: She has a normal mood and affect. Her behavior is normal.    ED Course  Procedures (including critical care time)  Labs Reviewed - No data to display Dg Knee Complete 4 Views Left  01/20/2012  *RADIOLOGY REPORT*  Clinical Data: Knee pain.  LEFT KNEE - COMPLETE 4+ VIEW  Comparison: No priors.  Findings: Four views of the left knee  demonstrate no acute fracture, subluxation, dislocation, joint or soft tissue abnormality.  IMPRESSION: 1.  No acute radiographic abnormality of the left knee.  Original Report Authenticated By: Florencia Reasons, M.D.     No diagnosis found.    MDM   Patient with left knee pain that has worsened over the last week. She states she's had knee pain for months but it just recently got worse. On exam patient has a left knee effusion without erythema, warmth or signs of a septic joint. Plain film shows effusion but otherwise within normal limits. Patient given pain  control and followup with orthopedics.        Gwyneth Sprout, MD 01/20/12 1100

## 2012-01-20 NOTE — ED Notes (Signed)
Ortho paged for sleeve

## 2012-02-25 ENCOUNTER — Ambulatory Visit: Payer: Self-pay

## 2012-03-13 ENCOUNTER — Ambulatory Visit (INDEPENDENT_AMBULATORY_CARE_PROVIDER_SITE_OTHER): Payer: Medicaid Other | Admitting: Family Medicine

## 2012-03-13 ENCOUNTER — Encounter: Payer: Self-pay | Admitting: Family Medicine

## 2012-03-13 VITALS — BP 124/86 | HR 91 | Temp 98.3°F | Ht 66.0 in | Wt 188.0 lb

## 2012-03-13 DIAGNOSIS — M25562 Pain in left knee: Secondary | ICD-10-CM

## 2012-03-13 DIAGNOSIS — Z309 Encounter for contraceptive management, unspecified: Secondary | ICD-10-CM

## 2012-03-13 DIAGNOSIS — M25569 Pain in unspecified knee: Secondary | ICD-10-CM

## 2012-03-13 DIAGNOSIS — IMO0001 Reserved for inherently not codable concepts without codable children: Secondary | ICD-10-CM

## 2012-03-13 MED ORDER — MEDROXYPROGESTERONE ACETATE 150 MG/ML IM SUSP
150.0000 mg | Freq: Once | INTRAMUSCULAR | Status: AC
  Administered 2012-03-13: 150 mg via INTRAMUSCULAR

## 2012-03-13 NOTE — Patient Instructions (Addendum)
For your knee pain: -Try ibuprofen 800 mg every 8 hours as needed -Try Tylenol 650 mg every 8 hours as needed -We will schedule you with the orthopedist

## 2012-03-14 DIAGNOSIS — IMO0001 Reserved for inherently not codable concepts without codable children: Secondary | ICD-10-CM | POA: Insufficient documentation
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
## 2012-03-14 NOTE — Assessment & Plan Note (Signed)
Will refer to orthopedics. Concern for mechanical knee injury>>>Appointment made for 08/26 Work note given for light duty until further recommendations from ortho appointment. She works as Lawyer.  NSAID scheduled for pain until her appointment

## 2012-03-14 NOTE — Assessment & Plan Note (Signed)
Patient requesting Depo. Upreg negative. Will give first dose today.

## 2012-03-26 ENCOUNTER — Other Ambulatory Visit (INDEPENDENT_AMBULATORY_CARE_PROVIDER_SITE_OTHER): Payer: Medicaid Other

## 2012-03-26 DIAGNOSIS — IMO0001 Reserved for inherently not codable concepts without codable children: Secondary | ICD-10-CM

## 2012-03-26 DIAGNOSIS — Z309 Encounter for contraceptive management, unspecified: Secondary | ICD-10-CM

## 2012-03-26 LAB — POCT URINE PREGNANCY: Preg Test, Ur: NEGATIVE

## 2012-03-26 NOTE — Progress Notes (Signed)
Pt came for 2nd urine pregnancy test after depo injection

## 2012-05-12 ENCOUNTER — Ambulatory Visit: Payer: Medicaid Other | Attending: Orthopedic Surgery | Admitting: Physical Therapy

## 2012-05-12 DIAGNOSIS — R262 Difficulty in walking, not elsewhere classified: Secondary | ICD-10-CM | POA: Insufficient documentation

## 2012-05-12 DIAGNOSIS — IMO0001 Reserved for inherently not codable concepts without codable children: Secondary | ICD-10-CM | POA: Insufficient documentation

## 2012-05-12 DIAGNOSIS — M25669 Stiffness of unspecified knee, not elsewhere classified: Secondary | ICD-10-CM | POA: Insufficient documentation

## 2012-05-19 ENCOUNTER — Ambulatory Visit: Payer: Medicaid Other | Admitting: Physical Therapy

## 2012-05-26 ENCOUNTER — Ambulatory Visit: Payer: Medicaid Other | Attending: Orthopedic Surgery | Admitting: Physical Therapy

## 2012-05-26 DIAGNOSIS — R262 Difficulty in walking, not elsewhere classified: Secondary | ICD-10-CM | POA: Insufficient documentation

## 2012-05-26 DIAGNOSIS — M25669 Stiffness of unspecified knee, not elsewhere classified: Secondary | ICD-10-CM | POA: Insufficient documentation

## 2012-05-26 DIAGNOSIS — IMO0001 Reserved for inherently not codable concepts without codable children: Secondary | ICD-10-CM | POA: Insufficient documentation

## 2012-05-27 ENCOUNTER — Ambulatory Visit (INDEPENDENT_AMBULATORY_CARE_PROVIDER_SITE_OTHER): Payer: Medicaid Other | Admitting: *Deleted

## 2012-05-27 DIAGNOSIS — Z23 Encounter for immunization: Secondary | ICD-10-CM

## 2012-06-02 ENCOUNTER — Encounter: Payer: Medicaid Other | Admitting: Physical Therapy

## 2012-06-03 ENCOUNTER — Ambulatory Visit (INDEPENDENT_AMBULATORY_CARE_PROVIDER_SITE_OTHER): Payer: Medicaid Other | Admitting: *Deleted

## 2012-06-03 ENCOUNTER — Other Ambulatory Visit: Payer: Self-pay | Admitting: Family Medicine

## 2012-06-03 DIAGNOSIS — Z309 Encounter for contraceptive management, unspecified: Secondary | ICD-10-CM

## 2012-06-03 MED ORDER — MEDROXYPROGESTERONE ACETATE 150 MG/ML IM SUSP: 150.0000 mg | INTRAMUSCULAR | Status: DC

## 2012-06-03 MED ORDER — MEDROXYPROGESTERONE ACETATE 150 MG/ML IM SUSP
150.0000 mg | Freq: Once | INTRAMUSCULAR | Status: AC
  Administered 2012-06-03: 150 mg via INTRAMUSCULAR

## 2012-06-09 ENCOUNTER — Encounter: Payer: Medicaid Other | Admitting: Physical Therapy

## 2012-08-19 ENCOUNTER — Ambulatory Visit (INDEPENDENT_AMBULATORY_CARE_PROVIDER_SITE_OTHER): Payer: Medicaid Other | Admitting: *Deleted

## 2012-08-19 DIAGNOSIS — Z309 Encounter for contraceptive management, unspecified: Secondary | ICD-10-CM

## 2012-08-19 MED ORDER — MEDROXYPROGESTERONE ACETATE 150 MG/ML IM SUSP
150.0000 mg | Freq: Once | INTRAMUSCULAR | Status: AC
  Administered 2012-08-19: 150 mg via INTRAMUSCULAR

## 2012-08-19 NOTE — Progress Notes (Signed)
Next Depo due April 15 through November 18, 2012. Appointment scheduled for CPE and PAP 09/08/2012

## 2012-08-22 ENCOUNTER — Ambulatory Visit (INDEPENDENT_AMBULATORY_CARE_PROVIDER_SITE_OTHER): Payer: Medicaid Other | Admitting: Family Medicine

## 2012-08-22 ENCOUNTER — Encounter: Payer: Self-pay | Admitting: Family Medicine

## 2012-08-22 VITALS — BP 125/81 | HR 101 | Temp 98.0°F | Ht 66.0 in | Wt 202.0 lb

## 2012-08-22 DIAGNOSIS — H6120 Impacted cerumen, unspecified ear: Secondary | ICD-10-CM | POA: Insufficient documentation

## 2012-08-22 NOTE — Progress Notes (Signed)
  Subjective:    Patient ID: Ruth [name], female    DOB: 10-Mar-1981, 32 y.o.   MRN: [id number]  HPI Work-in visit for c/o R sided ear discomfort and decreased audition for 1 week; no fevers/chills, no true otalgia. Some nasal congestion noted, no sore throat. Daughter ill with pneumonia 2 weeks ago.  No cough.  No otorrhea.  No history of ear infections or hearing problems.   Patient smokes 3 cigarettes a day.  No changes in smoking habits since onset of this episode (past 1 week).    Review of Systems     Objective:   Physical Exam Well appearing, no apparent distress HEENT Neck supple.  TMs occluded by thick waxy cerumen bilaterally. No pain to palpate tragi or pinnae bilat.  No periauricular adenopathy, no cervical adenopathy.  Injected conjunctivae bilat.  Mildly injected oropharynx without exudates.  No sinus tenderness.  Nasal mucosa mildly hyperemic.        Assessment & Plan:

## 2012-08-22 NOTE — Assessment & Plan Note (Signed)
Bilateral irrigation performed; both TMs now visible, healthy and without erythema.  Patient reports clinical improvement.  May use H2O2 periodically to keep wax down.

## 2012-09-08 ENCOUNTER — Encounter: Payer: Medicaid Other | Admitting: Family Medicine

## 2012-11-10 ENCOUNTER — Ambulatory Visit (INDEPENDENT_AMBULATORY_CARE_PROVIDER_SITE_OTHER): Payer: Medicaid Other | Admitting: *Deleted

## 2012-11-10 DIAGNOSIS — Z309 Encounter for contraceptive management, unspecified: Secondary | ICD-10-CM

## 2012-11-10 DIAGNOSIS — IMO0001 Reserved for inherently not codable concepts without codable children: Secondary | ICD-10-CM

## 2012-11-10 MED ORDER — MEDROXYPROGESTERONE ACETATE 150 MG/ML IM SUSP
150.0000 mg | Freq: Once | INTRAMUSCULAR | Status: AC
  Administered 2012-11-10: 150 mg via INTRAMUSCULAR

## 2012-11-10 NOTE — Progress Notes (Signed)
Patient here today for Depo Provera injection.  Depo given today.  Next injection due 7/7-7/21 Gaylene Brooks, RN

## 2013-01-28 ENCOUNTER — Ambulatory Visit (INDEPENDENT_AMBULATORY_CARE_PROVIDER_SITE_OTHER): Payer: Medicaid Other | Admitting: *Deleted

## 2013-01-28 DIAGNOSIS — IMO0001 Reserved for inherently not codable concepts without codable children: Secondary | ICD-10-CM

## 2013-01-28 DIAGNOSIS — Z309 Encounter for contraceptive management, unspecified: Secondary | ICD-10-CM

## 2013-01-28 MED ORDER — MEDROXYPROGESTERONE ACETATE 150 MG/ML IM SUSP
150.0000 mg | Freq: Once | INTRAMUSCULAR | Status: AC
  Administered 2013-01-28: 150 mg via INTRAMUSCULAR

## 2013-01-28 NOTE — Progress Notes (Signed)
Pt here for depo. Depo given. Next depo due sept 24-oct 8-reminder card given. Wyatt Haste, RN-BSN

## 2013-02-09 ENCOUNTER — Encounter: Payer: Medicaid Other | Admitting: Family Medicine

## 2013-02-20 ENCOUNTER — Ambulatory Visit (INDEPENDENT_AMBULATORY_CARE_PROVIDER_SITE_OTHER): Payer: Medicaid Other | Admitting: Family Medicine

## 2013-02-20 ENCOUNTER — Encounter: Payer: Self-pay | Admitting: Family Medicine

## 2013-02-20 VITALS — BP 127/87 | HR 108 | Temp 98.3°F | Wt 222.0 lb

## 2013-02-20 DIAGNOSIS — Z0000 Encounter for general adult medical examination without abnormal findings: Secondary | ICD-10-CM

## 2013-02-20 NOTE — Patient Instructions (Addendum)
Ruth [name] it was great to meet you today Try to incorporate those diet ideas we discussed. Whole grains, proteins, anything that grows from the earth. Less junky sugary foods. 3 main meals per day with 1-2 snacks in between meals. Never skip breakfast and never go more than 4 hrs without eating. You do not need to starve yourself to lose weight Try to keep a daily step counter to see how many miles you are walking in a day You can purchase dvds and 3-5 lb weights at [employer] which are great to try at home  For itching and bug bites you can try calamine lotion or topical benadryl preparations

## 2013-02-20 NOTE — Progress Notes (Signed)
Patient ID: Ruth [name], female   DOB: 11-Aug-1980, 32 y.o.   MRN: [id number] [facility] [name], MD Phone: [phone]  Subjective:   # hx of bug bites -has hx of bad reaction to bug bites, no bites now or active lesions  # weight loss -used to work third shift and it altered her hunger cravings. Previously tried liquid diet and weight loss pills. Seems to not eat enough during the day. Has tried to exercise in the past. 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 But now no longer working this shift. Intends to go to the gym today and sign up for a membership. Seems to be enthusiastic about weight loss plans.  ROS--See HPI  Past Medical History Patient Active Problem List   Diagnosis Date Noted  . Cerumen impaction 08/22/2012  . Birth control 03/14/2012  . Constipation 11/09/2011  . Left knee pain 07/09/2011  . 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 ASTHMA, INTERMITTENT 02/19/2007   Reviewed problem list.  Medications- reviewed and updated Chief complaint-noted  Objective: BP 127/87  Pulse 108  Temp(Src) 98.3 F (36.8 C) (Oral)  Wt 222 lb (100.699 kg)  BMI 35.85 kg/m2 Gen: NAD, alert, cooperative with exam Skin: no rashes no lesions, some scarring on lower extremities   Assessment/Plan: A. HCM- weight management -is not due for pap today, last pap 2012 was neg for intraepithelial lesions  P Whole grains, proteins, anything that grows from the earth. Less junky sugary foods. -3 main meals per day with 1-2 snacks in between meals. Never skip breakfast and never go more than 4 hrs without eating.Discourage starvation and weight loss pills.Daily step counter. Purchase dvds and 3-5 lb weights at [employer] which are great to try at home  A: Bug bites- No active lesions P: For itching and bug bites calamine lotion or topical  benadryl preparations

## 2013-02-24 IMAGING — CR DG KNEE COMPLETE 4+V*L*
4 series · 4 of 4 positions shown · non-contrast
Comparison: No priors.

CLINICAL DATA: Knee pain.

LEFT KNEE - COMPLETE 4+ VIEW

[t knee ap left]
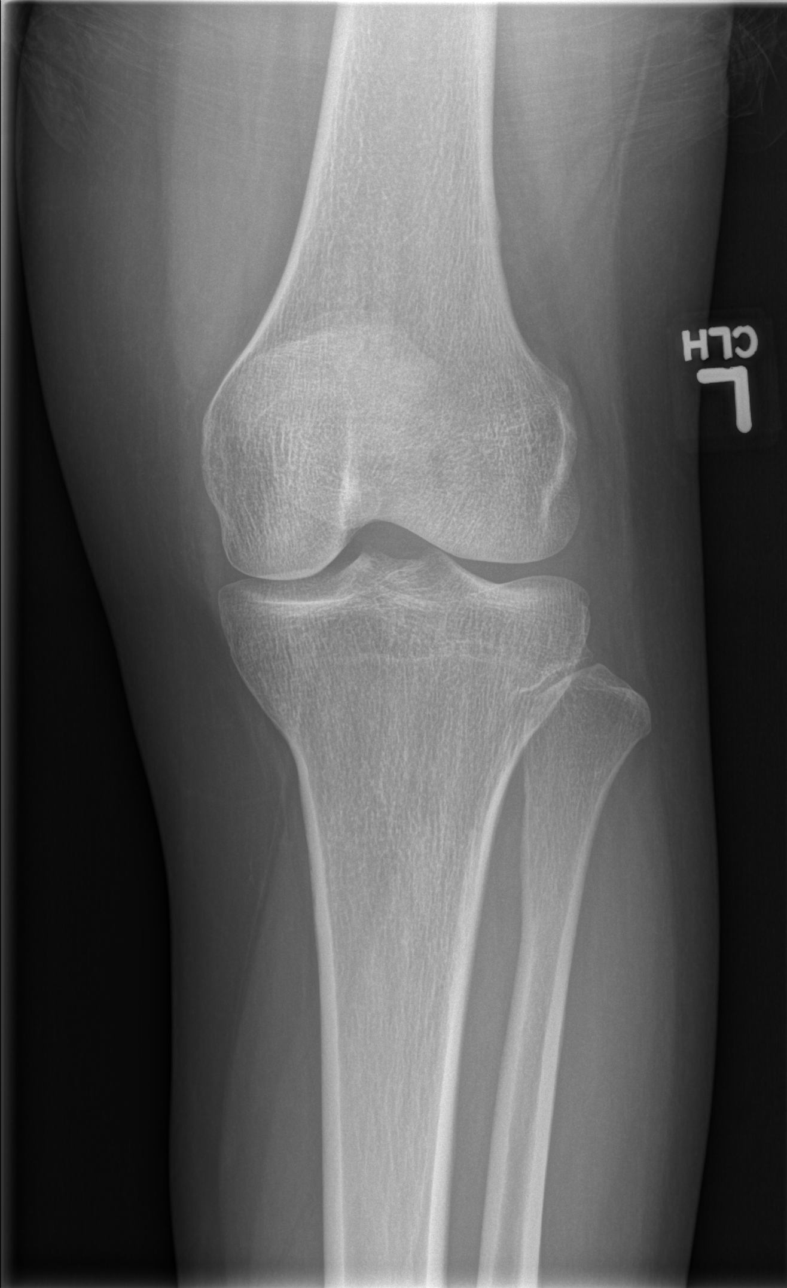

[t knee obl left (1 of 2)]
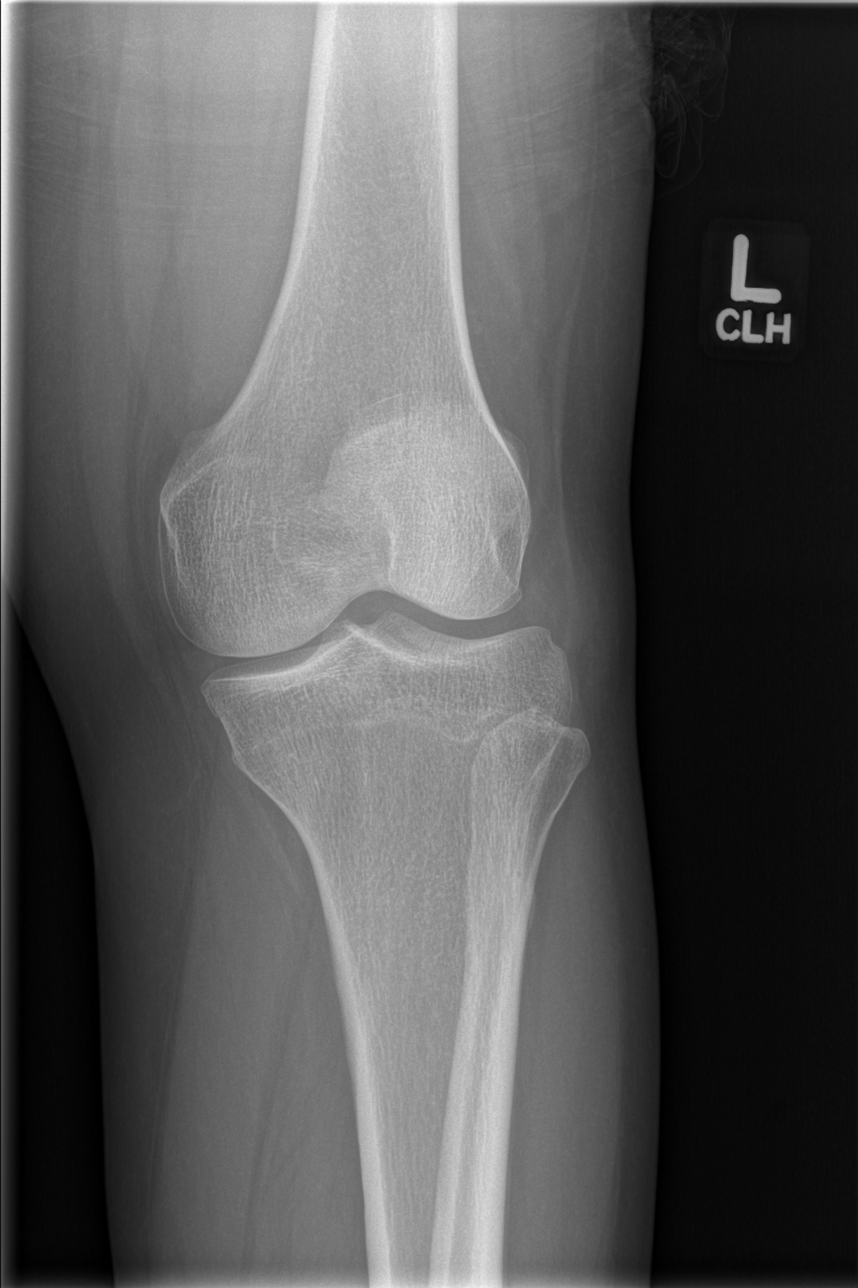

[t knee obl left (2 of 2)]
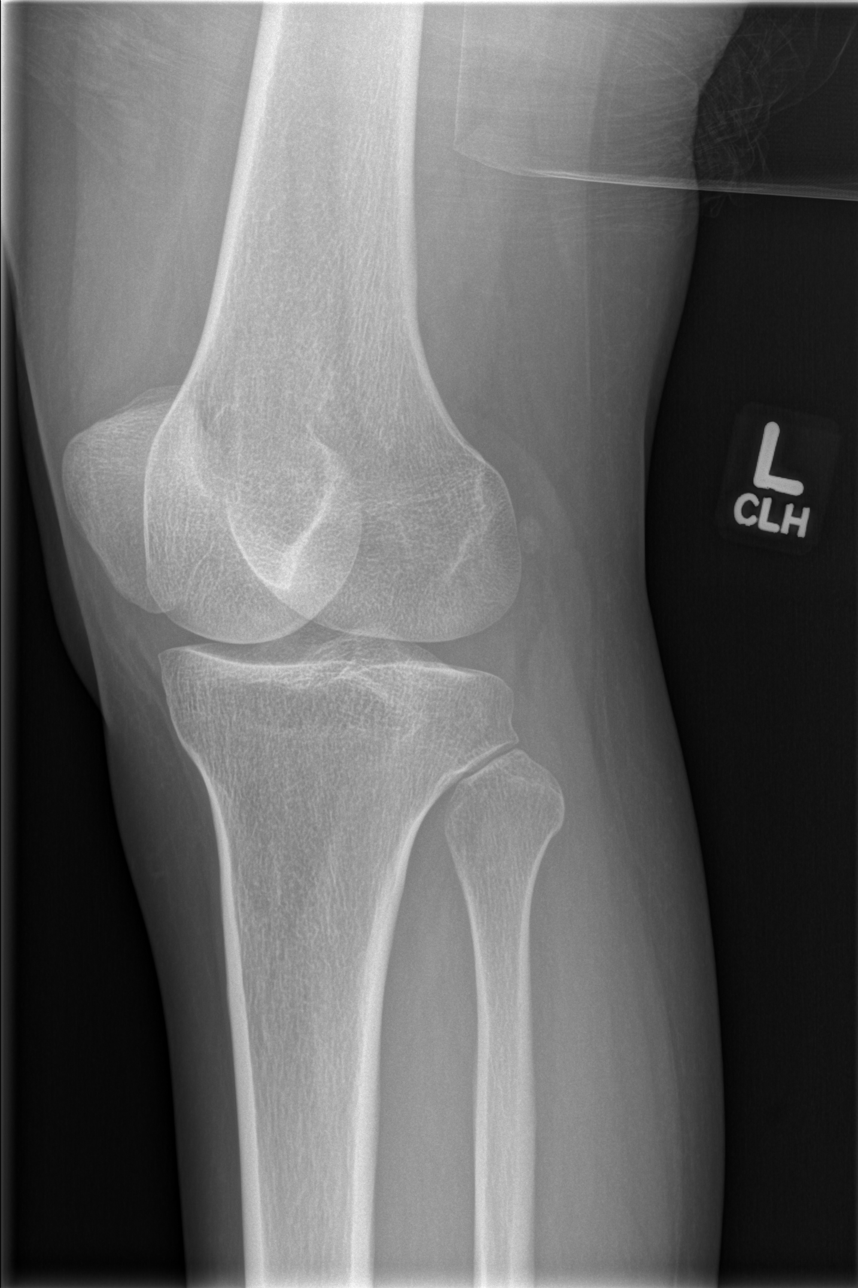

[t knee lat left]
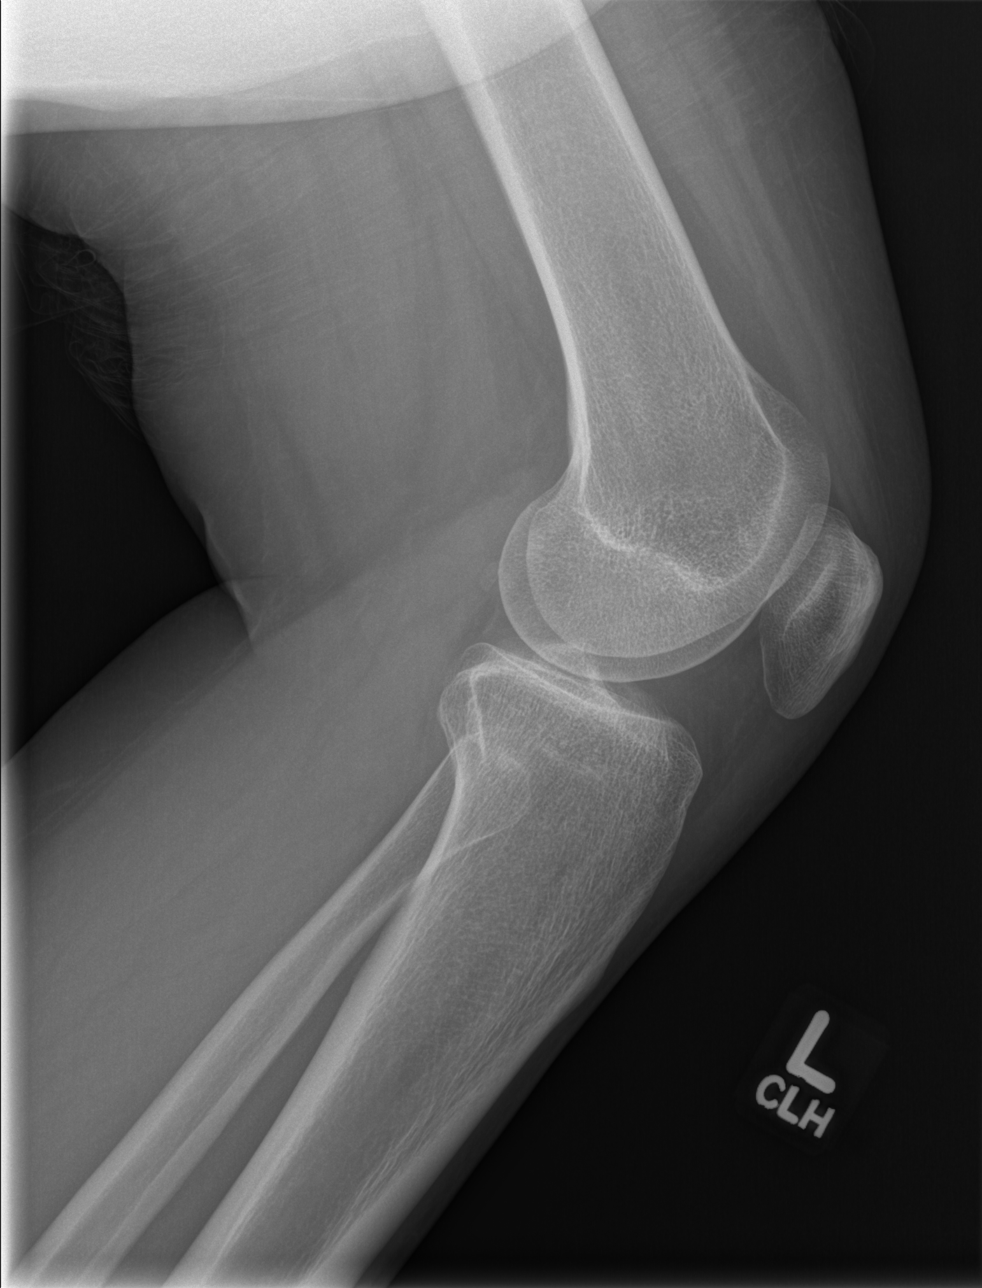

[4 of 4 positions shown; findings below may reference images not displayed]

FINDINGS: Four views of the left knee demonstrate no acute
fracture, subluxation, dislocation, joint or soft tissue
abnormality.
IMPRESSION: 1.  No acute radiographic abnormality of the left knee.

## 2013-04-29 ENCOUNTER — Encounter: Payer: Self-pay | Admitting: Family Medicine

## 2013-04-29 ENCOUNTER — Telehealth: Payer: Self-pay | Admitting: Family Medicine

## 2013-04-29 ENCOUNTER — Ambulatory Visit (INDEPENDENT_AMBULATORY_CARE_PROVIDER_SITE_OTHER): Payer: Medicaid Other | Admitting: *Deleted

## 2013-04-29 DIAGNOSIS — Z23 Encounter for immunization: Secondary | ICD-10-CM

## 2013-04-29 DIAGNOSIS — Z309 Encounter for contraceptive management, unspecified: Secondary | ICD-10-CM

## 2013-04-29 DIAGNOSIS — IMO0001 Reserved for inherently not codable concepts without codable children: Secondary | ICD-10-CM

## 2013-04-29 MED ORDER — MEDROXYPROGESTERONE ACETATE 150 MG/ML IM SUSP
150.0000 mg | Freq: Once | INTRAMUSCULAR | Status: AC
  Administered 2013-04-29: 150 mg via INTRAMUSCULAR

## 2013-04-29 NOTE — Telephone Encounter (Signed)
Last time I spoke with Cashe I believe she was interested in considering other types of contraception besides depo. I'm happy for her to continue with depo if she likes but also happy to have that discussion with her again before her next shot is due in 3 months time. Could we clarify this? Thanks! [facility], MD

## 2013-04-29 NOTE — Progress Notes (Signed)
Patient in today for depo, depo given in the right deltoid, no complaints noted. Next depo due December 24 - January 7, patient aware.

## 2013-04-30 NOTE — Telephone Encounter (Signed)
Pt states that she is very happy with the depo and is not interested in changing.  She states that the last time she had is issue she had a "bacteria in her stomach".  Pt agreeable to appt on 06/10/2013. Derrall Hicks, Maryjo Rochester

## 2013-06-02 ENCOUNTER — Ambulatory Visit: Payer: Medicaid Other | Admitting: Family Medicine

## 2013-06-10 ENCOUNTER — Ambulatory Visit: Payer: Medicaid Other | Admitting: Family Medicine

## 2013-06-15 ENCOUNTER — Encounter: Payer: Self-pay | Admitting: Family Medicine

## 2013-06-15 ENCOUNTER — Ambulatory Visit (INDEPENDENT_AMBULATORY_CARE_PROVIDER_SITE_OTHER): Payer: Medicaid Other | Admitting: Family Medicine

## 2013-06-15 VITALS — BP 121/86 | HR 94 | Temp 98.5°F | Wt 216.0 lb

## 2013-06-15 DIAGNOSIS — L259 Unspecified contact dermatitis, unspecified cause: Secondary | ICD-10-CM

## 2013-06-15 MED ORDER — PREDNISONE 10 MG PO TABS: 10.0000 mg | ORAL_TABLET | Freq: Every day | ORAL | Status: DC

## 2013-06-15 NOTE — Assessment & Plan Note (Signed)
Bullae consistent in appearance with contact dermatitis, but does not itch. Does not appear to be infected. Unclear what exposure causing this, but given clinically well appearance and otherwise normal exam will treat with short course oral steroids and plan for follow up in 1 week or earlier if symptoms worsen.

## 2013-06-15 NOTE — Patient Instructions (Signed)
It looks like the skin blisters are likely an allergic reaction to something that you touched (we call it Contact Dermatitis).  We will give you a short course of oral steroids for 7 days. Please follow up in 1 week or sooner if they start to get worse, painful, tender.

## 2013-06-15 NOTE — Progress Notes (Signed)
  Subjective:    Patient ID: Ruth [name], female    DOB: 08-20-1980, 32 y.o.   MRN: [id number]  HPI  # Skin blisters on arms - First noticed Friday on left arm, 2 small ones on upper and lower arm, and 1 on lower right arm. These burst and have started to heal - 2 larger blisters developed on right upper arm, 1 burst while showering this morning and 2nd is intact - Not painful, not itchy - Has not been using any new topical creams, cleaning supplies, or any other new exposure she can recall - Has been cleaning spider webs around ceiling for the past week and did notice 1 small spider on her arm last week, but no bites that she is aware of - Feels completely normal, just worried about the sudden onset of the blisters ROS: no fevers/chills  Review of Systems Per HPI    Objective:   Physical Exam BP 121/86  Pulse 94  Temp(Src) 98.5 F (36.9 C) (Oral)  Wt 216 lb (97.977 kg)  CV: RRR, no murmurs Resp: CTAB Extremities: no edema or swelling of arms or legs Skin: several old, ~48mm healing ruptured blisters on left and right arm.  ~1cm open and ruptured bullae on anterior right upper arm. Erythema present around area. ~1.5cm tense, intact bullae on posterior upper arm. Erythema present around area.     Assessment & Plan:  See Problem List documentation

## 2013-06-25 ENCOUNTER — Encounter: Payer: Self-pay | Admitting: Family Medicine

## 2013-06-25 ENCOUNTER — Ambulatory Visit (INDEPENDENT_AMBULATORY_CARE_PROVIDER_SITE_OTHER): Payer: Medicaid Other | Admitting: Family Medicine

## 2013-06-25 VITALS — BP 111/80 | HR 89 | Temp 98.0°F | Ht 66.0 in | Wt 215.0 lb

## 2013-06-25 DIAGNOSIS — M25569 Pain in unspecified knee: Secondary | ICD-10-CM

## 2013-06-25 DIAGNOSIS — Z0000 Encounter for general adult medical examination without abnormal findings: Secondary | ICD-10-CM

## 2013-06-25 DIAGNOSIS — R140 Abdominal distension (gaseous): Secondary | ICD-10-CM

## 2013-06-25 DIAGNOSIS — R141 Gas pain: Secondary | ICD-10-CM

## 2013-06-25 DIAGNOSIS — E669 Obesity, unspecified: Secondary | ICD-10-CM

## 2013-06-25 DIAGNOSIS — M25562 Pain in left knee: Secondary | ICD-10-CM

## 2013-06-25 LAB — COMPREHENSIVE METABOLIC PANEL
ALT: 52 U/L — ABNORMAL HIGH (ref 0–35)
AST: 27 U/L (ref 0–37)
Albumin: 3.7 g/dL (ref 3.5–5.2)
Alkaline Phosphatase: 74 U/L (ref 39–117)
BUN: 9 mg/dL (ref 6–23)
Calcium: 9.5 mg/dL (ref 8.4–10.5)
Chloride: 107 mEq/L (ref 96–112)
Creat: 0.73 mg/dL (ref 0.50–1.10)
Potassium: 4.3 mEq/L (ref 3.5–5.3)

## 2013-06-25 LAB — TSH: TSH: 1.754 u[IU]/mL (ref 0.350–4.500)

## 2013-06-25 MED ORDER — DICLOFENAC SODIUM 75 MG PO TBEC: 75.0000 mg | DELAYED_RELEASE_TABLET | Freq: Two times a day (BID) | ORAL | Status: DC

## 2013-06-25 NOTE — Progress Notes (Signed)
Patient ID: Ruth [name], female   DOB: 02/17/1981, 32 y.o.   MRN: [id number] [facility] [name], MD Phone: [phone]  Subjective:   # bloating -feels that belly is much larger lately, denies GI pain -no n/v/diarrhea or increased flatulence  -has chronic weight issues, but actually lost 7 lbs since has last seen me in august -still not exercising at all outside of work and continuing to skip meals (mostly breakfast)  # f/up bulla on right arm - saw Dr. Waynetta Sandy in clinic on 11/24, diagnosed with contact dermatitis and bulla on right arm s/p 1 week of prednisone with relief of irritation -blistered s/p opening with drainage and now crusty and dry  #L knee pain -has hx of meniscus tear treated conservatively. Also with hx of cyst?? 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 Contact dermatitis 06/15/2013  . Cerumen impaction 08/22/2012  . Birth control 03/14/2012  . Constipation 11/09/2011  . Left knee pain 07/09/2011  . 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 ASTHMA, INTERMITTENT 02/19/2007   Reviewed problem list.  Medications- reviewed and updated Chief complaint-noted No additions to family history Social history- patient is a current 1-2 cigarette per day smoker  Objective: BP 111/80  Pulse 89  Temp(Src) 98 F (36.7 C) (Oral)  Ht 5\' 6"  (1.676 m)  Wt 215 lb (97.523 kg)  BMI 34.72 kg/m2 Gen: NAD, alert, cooperative with exam HEENT: no  erythema or tonsillar hyperemia  Abd: SNTND, BS present, no guarding or organomegaly Ext: No edema, warm, normal tone, moves UE/LE spontaneously, no effusion, minor joint line tenderness on lateral aspect of left knee, no limited in flexion or extension, neg mcmurrays neg ant/post drawer Skin: no rashes two healing dry lesions on upper right arm and forearm, no expressing fluid  Assessment/Plan: See problem based a/p

## 2013-06-25 NOTE — Patient Instructions (Addendum)
Ruth [name] it was great to see you! I am glad to see that the lesions on your arm have resolved For your knee pain I would like you to try a topical anti inflammatory I have called into your pharmacy, if your pain continues or worsens in the next few weeks we can decide whether xrays may be warranted You can also use a oral NSAID like naproxen or ibuprofen for a short period of time ideally 7-14 days to calm down inflammation, try to ice when not at work I would recommend you start a food journal or do daily calorie counting there are many helpful websites online to assist you (i.e http://www.leedsportal.com). Eating lots of fiber helps to keep you full. Here are some examples I would also suggest calling Dr. Gerilyn Pilgrim to schedule an appointment for dietary strategies.   I will let you know the results of your blood work See you back in 3 months for recheck of your weight and progress, or sooner as needed  Ruth Ferretti, MD  Fiber Content in Foods Drinking plenty of fluids and consuming foods high in fiber can help with constipation. See the list below for the fiber content of some common foods. Starches and Grains / Dietary Fiber (g)  Cheerios, 1 cup / 3 g  Kellogg's Corn Flakes, 1 cup / 0.7 g  Rice Krispies, 1  cup / 0.3 g  Quaker Oat Life Cereal,  cup / 2.1 g  Oatmeal, instant (cooked),  cup / 2 g  Kellogg's Frosted Mini Wheats, 1 cup / 5.1 g  Rice, [place], long-grain (cooked), 1 cup / 3.5 g  Rice, white, long-grain (cooked), 1 cup / 0.6 g  Macaroni, cooked, enriched, 1 cup / 2.5 g Legumes / Dietary Fiber (g)  Beans, baked, canned, plain or vegetarian,  cup / 5.2 g  Beans, kidney, canned,  cup / 6.8 g  Beans, pinto, dried (cooked),  cup / 7.7 g  Beans, pinto, canned,  cup / 5.5 g Breads and Crackers / Dietary Fiber (g)  Graham crackers, plain or honey, 2 squares / 0.7 g  Saltine crackers, 3 squares / 0.3 g  Pretzels, plain, salted, 10 pieces / 1.8 g  Bread,  whole-wheat, 1 slice / 1.9 g  Bread, white, 1 slice / 0.7 g  Bread, raisin, 1 slice / 1.2 g  Bagel, plain, 3 oz / 2 g  Tortilla, flour, 1 oz / 0.9 g  Tortilla, corn, 1 small / 1.5 g  Bun, hamburger or hotdog, 1 small / 0.9 g Fruits / Dietary Fiber (g)  Apple, raw with skin, 1 medium / 4.4 g  Applesauce, sweetened,  cup / 1.5 g  Banana,  medium / 1.5 g  Grapes, 10 grapes / 0.4 g  Orange, 1 small / 2.3 g  Raisin, 1.5 oz / 1.6 g  Melon, 1 cup / 1.4 g Vegetables / Dietary Fiber (g)  Green beans, canned,  cup / 1.3 g  Carrots (cooked),  cup / 2.3 g  Broccoli (cooked),  cup / 2.8 g  Peas, frozen (cooked),  cup / 4.4 g  Potatoes, mashed,  cup / 1.6 g  Lettuce, 1 cup / 0.5 g  Corn, canned,  cup / 1.6 g  Tomato,  cup / 1.1 g Document Released: 11/25/2006 Document Revised: 10/01/2011 Document Reviewed: 01/20/2007 ExitCare Patient Information 2014 [place], [place].

## 2013-06-25 NOTE — Assessment & Plan Note (Signed)
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

## 2013-06-25 NOTE — Assessment & Plan Note (Signed)
A: still continuing to make bad choices about diet and no exercise regimen at all, also recently complete steroid course, has been on depo chronically for years; 7lb weight loss since last visit, also works third shift, doesn't sleep much P: encouraged sustained activity raising HR to 80% capacity for at least 30 mins 3 times per week, CMET, TSH, given handouts on fiber and given dr. Ardeen Garland information (doubtful she will call) f/up 3 months

## 2013-07-28 ENCOUNTER — Ambulatory Visit (INDEPENDENT_AMBULATORY_CARE_PROVIDER_SITE_OTHER): Payer: Medicaid Other | Admitting: *Deleted

## 2013-07-28 DIAGNOSIS — IMO0001 Reserved for inherently not codable concepts without codable children: Secondary | ICD-10-CM

## 2013-07-28 DIAGNOSIS — Z309 Encounter for contraceptive management, unspecified: Secondary | ICD-10-CM

## 2013-07-28 MED ORDER — MEDROXYPROGESTERONE ACETATE 150 MG/ML IM SUSP
150.0000 mg | Freq: Once | INTRAMUSCULAR | Status: AC
  Administered 2013-07-28: 150 mg via INTRAMUSCULAR

## 2013-07-28 NOTE — Progress Notes (Signed)
Patient in today for depo. Injection given in right deltoid, patient without complaints, site unremarkable. Next depo due March 24 - April 7, patient aware.

## 2013-10-21 ENCOUNTER — Ambulatory Visit (INDEPENDENT_AMBULATORY_CARE_PROVIDER_SITE_OTHER): Payer: Medicaid Other | Admitting: *Deleted

## 2013-10-21 DIAGNOSIS — Z309 Encounter for contraceptive management, unspecified: Secondary | ICD-10-CM

## 2013-10-21 DIAGNOSIS — IMO0001 Reserved for inherently not codable concepts without codable children: Secondary | ICD-10-CM

## 2013-10-21 MED ORDER — MEDROXYPROGESTERONE ACETATE 150 MG/ML IM SUSP
150.0000 mg | Freq: Once | INTRAMUSCULAR | Status: AC
  Administered 2013-10-21: 150 mg via INTRAMUSCULAR

## 2013-10-21 NOTE — Progress Notes (Signed)
   Pt in for Depo Provera injection.  Pt tolerated Depo injection. Depo given Left upper outer quadrant.  Next injection due June 19 - January 20, 2014.  Reminder card given. Clovis PuMartin, Analyce Tavares L, RN

## 2013-12-02 ENCOUNTER — Telehealth: Payer: Self-pay | Admitting: Family Medicine

## 2013-12-02 NOTE — Telephone Encounter (Signed)
Spoke with patient and made her an appt to discuss her TB with the nurse.  She states that she doesn't get the shot that she just has a form filled out.  Appt made for tomorrow and asked patient to bring form with her. Jazmin Hartsell,CMA

## 2013-12-02 NOTE — Telephone Encounter (Signed)
Pt  Wants an update on her TB test. Says it has been done before Please advise

## 2014-01-13 ENCOUNTER — Ambulatory Visit (INDEPENDENT_AMBULATORY_CARE_PROVIDER_SITE_OTHER): Payer: Medicaid Other | Admitting: *Deleted

## 2014-01-13 DIAGNOSIS — Z308 Encounter for other contraceptive management: Secondary | ICD-10-CM

## 2014-01-13 MED ORDER — MEDROXYPROGESTERONE ACETATE 150 MG/ML IM SUSP
150.0000 mg | Freq: Once | INTRAMUSCULAR | Status: AC
  Administered 2014-01-13: 150 mg via INTRAMUSCULAR

## 2014-01-13 NOTE — Progress Notes (Signed)
   Pt in for Depo Provera injection.  Pt tolerated Depo injection. Depo given Right upper outer quadrant.  Next injection due Sept 9-Sept 23, 2015.  Reminder card given. Clovis PuMartin, Tamika L, RN

## 2014-04-02 ENCOUNTER — Ambulatory Visit (INDEPENDENT_AMBULATORY_CARE_PROVIDER_SITE_OTHER): Payer: Self-pay | Admitting: *Deleted

## 2014-04-02 DIAGNOSIS — Z3042 Encounter for surveillance of injectable contraceptive: Secondary | ICD-10-CM

## 2014-04-02 DIAGNOSIS — Z308 Encounter for other contraceptive management: Secondary | ICD-10-CM

## 2014-04-02 DIAGNOSIS — Z3049 Encounter for surveillance of other contraceptives: Secondary | ICD-10-CM

## 2014-04-02 MED ORDER — MEDROXYPROGESTERONE ACETATE 150 MG/ML IM SUSP: 150.0000 mg | Freq: Once | INTRAMUSCULAR | Status: DC

## 2014-04-02 NOTE — Progress Notes (Signed)
   Pt in for Depo Provera injection. Pt opt out of receiving Depo Provera today.  Pt wanted to discuss other birthcontrol options.  Appt made for 04/21/2014 with PCP for physical and Nexplanon.  Clovis Pu, RN

## 2014-04-15 ENCOUNTER — Encounter: Payer: Medicaid Other | Admitting: Family Medicine

## 2014-04-20 ENCOUNTER — Ambulatory Visit: Payer: Medicaid Other

## 2014-04-21 ENCOUNTER — Ambulatory Visit (INDEPENDENT_AMBULATORY_CARE_PROVIDER_SITE_OTHER): Payer: Medicaid Other | Admitting: *Deleted

## 2014-04-21 ENCOUNTER — Encounter: Payer: Medicaid Other | Admitting: Family Medicine

## 2014-04-21 DIAGNOSIS — Z3042 Encounter for surveillance of injectable contraceptive: Secondary | ICD-10-CM

## 2014-04-21 DIAGNOSIS — Z23 Encounter for immunization: Secondary | ICD-10-CM

## 2014-04-21 DIAGNOSIS — Z3049 Encounter for surveillance of other contraceptives: Secondary | ICD-10-CM

## 2014-04-21 LAB — POCT URINE PREGNANCY: Preg Test, Ur: NEGATIVE

## 2014-04-21 MED ORDER — MEDROXYPROGESTERONE ACETATE 150 MG/ML IM SUSP
150.0000 mg | Freq: Once | INTRAMUSCULAR | Status: AC
  Administered 2014-04-21: 150 mg via INTRAMUSCULAR

## 2014-04-21 NOTE — Progress Notes (Signed)
  Pt late for Depo Provera injection.  Pregnancy test ordered; results negative.  Pt tolerated Depo injection. Depo given left upper outer quadrant.  Next injection due Dec 19-Jul 21, 2014.  Reminder card given. Clovis PuMartin, Tamika L, RN

## 2014-04-26 ENCOUNTER — Encounter: Payer: Self-pay | Admitting: *Deleted

## 2014-04-26 NOTE — Telephone Encounter (Signed)
This encounter was created in error - please disregard.

## 2014-06-20 ENCOUNTER — Encounter (HOSPITAL_COMMUNITY): Payer: Self-pay | Admitting: Emergency Medicine

## 2014-06-20 ENCOUNTER — Emergency Department (HOSPITAL_COMMUNITY): Payer: Medicaid Other

## 2014-06-20 ENCOUNTER — Emergency Department (HOSPITAL_COMMUNITY)
Admission: EM | Admit: 2014-06-20 | Discharge: 2014-06-20 | Disposition: A | Discharge status: Home / Self Care | Payer: Medicaid Other | Attending: Emergency Medicine | Admitting: Emergency Medicine

## 2014-06-20 DIAGNOSIS — Z8719 Personal history of other diseases of the digestive system: Secondary | ICD-10-CM | POA: Insufficient documentation

## 2014-06-20 DIAGNOSIS — Y998 Other external cause status: Secondary | ICD-10-CM | POA: Insufficient documentation

## 2014-06-20 DIAGNOSIS — J45909 Unspecified asthma, uncomplicated: Secondary | ICD-10-CM | POA: Diagnosis not present

## 2014-06-20 DIAGNOSIS — Z79899 Other long term (current) drug therapy: Secondary | ICD-10-CM | POA: Diagnosis not present

## 2014-06-20 DIAGNOSIS — R52 Pain, unspecified: Secondary | ICD-10-CM

## 2014-06-20 DIAGNOSIS — I10 Essential (primary) hypertension: Secondary | ICD-10-CM | POA: Diagnosis not present

## 2014-06-20 DIAGNOSIS — Z791 Long term (current) use of non-steroidal anti-inflammatories (NSAID): Secondary | ICD-10-CM | POA: Insufficient documentation

## 2014-06-20 DIAGNOSIS — W228XXA Striking against or struck by other objects, initial encounter: Secondary | ICD-10-CM | POA: Diagnosis not present

## 2014-06-20 DIAGNOSIS — M25562 Pain in left knee: Secondary | ICD-10-CM

## 2014-06-20 DIAGNOSIS — Y92410 Unspecified street and highway as the place of occurrence of the external cause: Secondary | ICD-10-CM | POA: Diagnosis not present

## 2014-06-20 DIAGNOSIS — Z7952 Long term (current) use of systemic steroids: Secondary | ICD-10-CM | POA: Insufficient documentation

## 2014-06-20 DIAGNOSIS — R609 Edema, unspecified: Secondary | ICD-10-CM

## 2014-06-20 DIAGNOSIS — Y9389 Activity, other specified: Secondary | ICD-10-CM | POA: Insufficient documentation

## 2014-06-20 DIAGNOSIS — Z720 Tobacco use: Secondary | ICD-10-CM | POA: Diagnosis not present

## 2014-06-20 DIAGNOSIS — S8992XA Unspecified injury of left lower leg, initial encounter: Secondary | ICD-10-CM | POA: Diagnosis present

## 2014-06-20 MED ORDER — METHOCARBAMOL 500 MG PO TABS: 500.0000 mg | ORAL_TABLET | Freq: Two times a day (BID) | ORAL | Status: DC

## 2014-06-20 MED ORDER — IBUPROFEN 800 MG PO TABS: 800.0000 mg | ORAL_TABLET | Freq: Three times a day (TID) | ORAL | Status: DC
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
## 2014-06-22 ENCOUNTER — Encounter: Payer: Self-pay | Admitting: Family Medicine

## 2014-06-22 ENCOUNTER — Ambulatory Visit (INDEPENDENT_AMBULATORY_CARE_PROVIDER_SITE_OTHER): Payer: Self-pay | Admitting: Family Medicine

## 2014-06-22 VITALS — BP 116/74 | HR 71 | Temp 98.5°F | Ht 66.0 in | Wt 213.1 lb

## 2014-06-22 DIAGNOSIS — S8992XA Unspecified injury of left lower leg, initial encounter: Secondary | ICD-10-CM | POA: Insufficient documentation

## 2014-06-22 MED ORDER — ACE NEOPRENE KNEE BRACE MISC: 1.0000 [IU] | Freq: Every day | Status: DC
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
## 2014-06-22 NOTE — Patient Instructions (Signed)
Thanks for coming in today!   I'm sorry that you are having pain in your knee.   Continue to utilize RICE therapy as outlined below.   Continue to use Ibuprofen to decrease the pain and inflammation.   I have given you a referral to Physical Therapy.   I have also sent in a prescription to your pharmacy for a neoprene knee brace to help with the pain and stability of your knee joint.   Please come back to the clinic if your knee continues to worsen or you have any other concern.   Otherwise, you will follow up with Dr. Michail JewelsMarsh in 4 weeks, and you should make an appointment for that time frame on your way out today.   Thanks for letting us take care of you!  Sincerely,  Devota Pacealeb Ameir Faria, MD Family Medicine - PGY 1

## 2014-06-29 ENCOUNTER — Ambulatory Visit: Payer: No Typology Code available for payment source

## 2014-07-19 ENCOUNTER — Ambulatory Visit (INDEPENDENT_AMBULATORY_CARE_PROVIDER_SITE_OTHER): Payer: Medicaid Other | Admitting: Family Medicine

## 2014-07-19 ENCOUNTER — Encounter: Payer: Self-pay | Admitting: Family Medicine

## 2014-07-19 DIAGNOSIS — S8992XA Unspecified injury of left lower leg, initial encounter: Secondary | ICD-10-CM

## 2014-07-19 DIAGNOSIS — Z3042 Encounter for surveillance of injectable contraceptive: Secondary | ICD-10-CM

## 2014-07-19 MED ORDER — MEDROXYPROGESTERONE ACETATE 150 MG/ML IM SUSP
150.0000 mg | Freq: Once | INTRAMUSCULAR | Status: AC
  Administered 2014-07-19: 150 mg via INTRAMUSCULAR
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
## 2014-07-19 NOTE — Patient Instructions (Signed)
Ruth [name] it was great to see you today!  I am sorry that you are not feeling well Let's get an MRI to look further at your knee  Take it easy as you are able  Please return to clinic if symptoms do not improve or worsen Feel better soon Charlane FerrettiMelanie C Hien Perreira, MD
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
## 2014-07-26 ENCOUNTER — Ambulatory Visit (HOSPITAL_COMMUNITY)
Admission: RE | Admit: 2014-07-26 | Discharge: 2014-07-26 | Disposition: A | Discharge status: Home / Self Care | Payer: Medicaid Other | Source: Ambulatory Visit | Attending: Family Medicine | Admitting: Family Medicine

## 2014-07-26 ENCOUNTER — Telehealth: Payer: Self-pay | Admitting: Family Medicine

## 2014-07-26 DIAGNOSIS — M25562 Pain in left knee: Secondary | ICD-10-CM

## 2014-07-26 DIAGNOSIS — S83282A Other tear of lateral meniscus, current injury, left knee, initial encounter: Secondary | ICD-10-CM | POA: Insufficient documentation

## 2014-07-26 DIAGNOSIS — M25862 Other specified joint disorders, left knee: Secondary | ICD-10-CM | POA: Insufficient documentation

## 2014-07-26 NOTE — Telephone Encounter (Signed)
Pt called because she said that Dr. Michail Jewels needed to know the name of the doctor she seen at [facility], Dr. Ranell Patrick. jw

## 2014-07-26 NOTE — Telephone Encounter (Signed)
Note to nursing staff - I contacted patient about MRI of her need, she needs referral to orthopedic surgery, I have placed this referral, she has previously been seen by an orthopedic surgeon (she does not remember the name), the patient is going to call the office with the name of the orthopedic surgeon so that we can arrange for an appointment

## 2014-08-23 HISTORY — PX: KNEE SURGERY: SHX244

## 2014-09-08 ENCOUNTER — Other Ambulatory Visit: Payer: Self-pay | Admitting: Orthopedic Surgery

## 2014-10-06 ENCOUNTER — Ambulatory Visit (INDEPENDENT_AMBULATORY_CARE_PROVIDER_SITE_OTHER): Payer: Medicaid Other | Admitting: *Deleted

## 2014-10-06 DIAGNOSIS — Z3042 Encounter for surveillance of injectable contraceptive: Secondary | ICD-10-CM

## 2014-10-06 MED ORDER — MEDROXYPROGESTERONE ACETATE 150 MG/ML IM SUSP
150.0000 mg | Freq: Once | INTRAMUSCULAR | Status: AC
  Administered 2014-10-06: 150 mg via INTRAMUSCULAR

## 2014-10-06 NOTE — Progress Notes (Signed)
Pt tolerated Depo injection well.  She will return for next depo June 1 - June 15. Fleeger, Maryjo RochesterJessica Dawn

## 2015-01-05 ENCOUNTER — Ambulatory Visit (INDEPENDENT_AMBULATORY_CARE_PROVIDER_SITE_OTHER): Payer: Medicaid Other | Admitting: *Deleted

## 2015-01-05 DIAGNOSIS — Z3042 Encounter for surveillance of injectable contraceptive: Secondary | ICD-10-CM | POA: Diagnosis present

## 2015-01-05 MED ORDER — MEDROXYPROGESTERONE ACETATE 150 MG/ML IM SUSP
150.0000 mg | Freq: Once | INTRAMUSCULAR | Status: AC
  Administered 2015-01-05: 150 mg via INTRAMUSCULAR

## 2015-01-05 NOTE — Progress Notes (Signed)
   Pt in for Depo Provera injection.  Pt tolerated Depo injection. Depo given left upper outer quadrant.  Next injection due August 31-Sept 14, 2016.  Reminder card given. Clovis Pu, RN

## 2015-01-06 ENCOUNTER — Ambulatory Visit: Payer: Medicaid Other | Admitting: Family Medicine

## 2015-03-30 ENCOUNTER — Ambulatory Visit: Payer: Medicaid Other

## 2015-04-06 ENCOUNTER — Ambulatory Visit (INDEPENDENT_AMBULATORY_CARE_PROVIDER_SITE_OTHER): Payer: Medicaid Other | Admitting: *Deleted

## 2015-04-06 DIAGNOSIS — Z3042 Encounter for surveillance of injectable contraceptive: Secondary | ICD-10-CM | POA: Diagnosis present

## 2015-04-06 MED ORDER — MEDROXYPROGESTERONE ACETATE 150 MG/ML IM SUSP
150.0000 mg | Freq: Once | INTRAMUSCULAR | Status: AC
  Administered 2015-04-06: 150 mg via INTRAMUSCULAR

## 2015-04-06 NOTE — Progress Notes (Signed)
   Pt in for Depo Provera injection.  Pt tolerated Depo injection. Depo given right upper outer quadrant.  Next injection due Nov. 30-Dec. 14, 2016.  Reminder card given. Clovis Pu, RN

## 2015-06-29 ENCOUNTER — Ambulatory Visit: Payer: Medicaid Other

## 2015-07-26 ENCOUNTER — Ambulatory Visit (INDEPENDENT_AMBULATORY_CARE_PROVIDER_SITE_OTHER): Payer: Medicaid Other | Admitting: *Deleted

## 2015-07-26 DIAGNOSIS — Z308 Encounter for other contraceptive management: Secondary | ICD-10-CM | POA: Diagnosis not present

## 2015-07-26 LAB — POCT URINE PREGNANCY: Preg Test, Ur: NEGATIVE

## 2015-07-26 MED ORDER — MEDROXYPROGESTERONE ACETATE 150 MG/ML IM SUSP
150.0000 mg | Freq: Once | INTRAMUSCULAR | Status: AC
  Administered 2015-07-26: 150 mg via INTRAMUSCULAR

## 2015-07-26 NOTE — Progress Notes (Signed)
    Patient presents for Depo Provera injection States feeling well Last injection received 04/06/2015 Was due for injection 11/30 to 07/06/2015 Urine pregnancy negative Patient states she has not had unprotected sex "in months" Depo provera given LUOQ IM Patient has questions on other forms of birth control as well as depo side effects such as abnormal hair growth. Patient states she has been searching on google. Package insert for depo given to patient so she has correct information. Encouraged patient to schedule appt with PCP to discuss depo and other options Patient instructed to use second form of contraception such as condoms and spermicide for next 7 days. Patient states understanding Next injection due March 21 to October 25, 2015 Reminder card given Fredderick SeveranceUCATTE, Janautica Netzley L, RN

## 2015-11-08 ENCOUNTER — Ambulatory Visit (INDEPENDENT_AMBULATORY_CARE_PROVIDER_SITE_OTHER): Payer: Medicaid Other | Admitting: *Deleted

## 2015-11-08 DIAGNOSIS — Z3049 Encounter for surveillance of other contraceptives: Secondary | ICD-10-CM | POA: Diagnosis present

## 2015-11-08 DIAGNOSIS — Z3042 Encounter for surveillance of injectable contraceptive: Secondary | ICD-10-CM

## 2015-11-08 LAB — POCT URINE PREGNANCY: Preg Test, Ur: NEGATIVE

## 2015-11-08 MED ORDER — MEDROXYPROGESTERONE ACETATE 150 MG/ML IM SUSP
150.0000 mg | Freq: Once | INTRAMUSCULAR | Status: AC
  Administered 2015-11-08: 150 mg via INTRAMUSCULAR

## 2015-11-08 NOTE — Progress Notes (Signed)
    Patient presents for Depo Provera injection States feeling well Last injection received 07/26/2015 Patient is overdue for injection. Urine pregnancy negative Patient insisting to receive injection in arm vs. gluteal. States her back feels "achy" when receiving depo injection in gluteal. Depo Provera given IM Left Deltoid. Patient tolerated well. Patient aware to use second method of birth control such as condoms and spermicide for next 7 days Next injection due July 4-18, 2017 Reminder card given Fredderick SeveranceUCATTE, Roniya Tetro L, RN

## 2016-02-23 ENCOUNTER — Ambulatory Visit: Payer: Medicaid Other

## 2016-03-12 ENCOUNTER — Ambulatory Visit (HOSPITAL_COMMUNITY)
Admission: RE | Admit: 2016-03-12 | Discharge: 2016-03-12 | Disposition: A | Discharge status: Home / Self Care | Payer: Medicaid Other | Source: Ambulatory Visit | Attending: Family Medicine | Admitting: Family Medicine

## 2016-03-12 ENCOUNTER — Ambulatory Visit (INDEPENDENT_AMBULATORY_CARE_PROVIDER_SITE_OTHER): Payer: Medicaid Other | Admitting: Student

## 2016-03-12 ENCOUNTER — Other Ambulatory Visit (HOSPITAL_COMMUNITY)
Admission: RE | Admit: 2016-03-12 | Discharge: 2016-03-12 | Disposition: A | Discharge status: Home / Self Care | Payer: Medicaid Other | Source: Ambulatory Visit | Attending: Family Medicine | Admitting: Family Medicine

## 2016-03-12 ENCOUNTER — Telehealth: Payer: Self-pay

## 2016-03-12 VITALS — BP 132/84 | HR 69 | Temp 98.5°F | Ht 66.0 in | Wt 202.6 lb

## 2016-03-12 DIAGNOSIS — Z30018 Encounter for initial prescription of other contraceptives: Secondary | ICD-10-CM

## 2016-03-12 DIAGNOSIS — Z01419 Encounter for gynecological examination (general) (routine) without abnormal findings: Secondary | ICD-10-CM | POA: Diagnosis present

## 2016-03-12 DIAGNOSIS — M5441 Lumbago with sciatica, right side: Secondary | ICD-10-CM

## 2016-03-12 DIAGNOSIS — Z0000 Encounter for general adult medical examination without abnormal findings: Secondary | ICD-10-CM | POA: Diagnosis not present

## 2016-03-12 DIAGNOSIS — M4186 Other forms of scoliosis, lumbar region: Secondary | ICD-10-CM | POA: Diagnosis not present

## 2016-03-12 DIAGNOSIS — Z1151 Encounter for screening for human papillomavirus (HPV): Secondary | ICD-10-CM | POA: Diagnosis not present

## 2016-03-12 LAB — POCT WET PREP (WET MOUNT)
Clue Cells Wet Prep Whiff POC: POSITIVE
Trichomonas Wet Prep HPF POC: ABSENT

## 2016-03-12 LAB — POCT URINE PREGNANCY: PREG TEST UR: NEGATIVE

## 2016-03-12 NOTE — Progress Notes (Signed)
   Subjective:    Patient ID: Ruth [name], female    DOB: [birth year], 35 y.o.   MRN: [id number]   CC: physical exam  HPI: 35 y/o female presents for physical exam, pap, contraception discussion  Contraception - past due for depo provera by 2-3 weeks - last sexually active with long term female partner 3 weeks ago, no condom use - desires to discuss contraception, but after stating tis stated she already decided on BTL  Gyn - denies vaginal irritation, dischage, itsching, dysuria, abd pain, fevers  Back pain - reports ongoing back pain worsening over the last 3 months - denies radiation of the paun - worse ober right lower back -Hurts with movement, not when she palpates  - feels this affects her daily function  Smoking status reviewed - smokes 6-7 cigarettes per day  Review of Systems  Per HPI, else denies recent illness, fever, headache, changes in vision, chest pain, shortness of breath, abdominal pain, N/V/D, weakness    Objective:  BP 132/84 (BP Location: Left Arm, Patient Position: Sitting, Cuff Size: Normal)   Pulse 69   Temp 98.5 F (36.9 C) (Oral)   Ht 5\' 6"  (1.676 m)   Wt 202 lb 9.6 oz (91.9 kg)   SpO2 100%   BMI 32.70 kg/m  Vitals and nursing note reviewed  General: NAD Cardiac: RRR, normal heart sounds, no murmurs. 2+ radial and PT pulses bilaterally Respiratory: CTAB, normal effort Abdomen: soft, nontender, nondistended,Bowel sounds present MSK: no TTP over lower back, negative straight leg test bilaterally, 5/5 strength bilateral LE Skin: warm and dry,  Neuro: alert and oriented  Pelvic: Normal EGBUS, normal vaginal canal, normal cervix with no CMT, normal mobile uterus, normal adnexa with no masses, no adnexal tenderness     Assessment & Plan:    Health maintenance examination Pap today, will follow  Birth control Discussed LARC methods as well as non estrogen containing alternative forms of contraception Given her age and smokiing status  combined OCPs are not an optin for her. She desires BTL - amb ref to gyn for consideration of BTL  Back pain No back pain on exam today, but given patient concern and affect on fuction. Will obtain lumbar XR. If negative, will consider PT    Ruth [name] A. Kennon RoundsHaney MD, MS Family Medicine Resident PGY-3 Pager [phone]

## 2016-03-12 NOTE — Patient Instructions (Signed)
Obtain x-ray of back You'll be called upon the results and if normal we will start physical therapy Referral to gynecology was placed for tubal ligation Please use condoms with each episode of intercourse If questions or concerns call the office at [phone]

## 2016-03-13 DIAGNOSIS — M549 Dorsalgia, unspecified: Secondary | ICD-10-CM | POA: Insufficient documentation

## 2016-03-13 DIAGNOSIS — Z0000 Encounter for general adult medical examination without abnormal findings: Secondary | ICD-10-CM | POA: Insufficient documentation

## 2016-03-13 NOTE — Assessment & Plan Note (Signed)
No back pain on exam today, but given patient concern and affect on fuction. Will obtain lumbar XR. If negative, will consider PT

## 2016-03-13 NOTE — Assessment & Plan Note (Signed)
Discussed LARC methods as well as non estrogen containing alternative forms of contraception Given her age and smokiing status combined OCPs are not an optin for her. She desires BTL - amb ref to gyn for consideration of BTL

## 2016-03-13 NOTE — Assessment & Plan Note (Signed)
Pap today, will follow 

## 2016-03-14 LAB — CYTOLOGY - PAP

## 2016-03-15 ENCOUNTER — Telehealth: Payer: Self-pay | Admitting: Student

## 2016-03-15 NOTE — Telephone Encounter (Signed)
Pt is calling and would like to know what her x-ray results were. jw

## 2016-03-15 NOTE — Telephone Encounter (Signed)
Patient calls again.  °

## 2016-03-16 ENCOUNTER — Telehealth: Payer: Self-pay | Admitting: Student

## 2016-03-16 DIAGNOSIS — B977 Papillomavirus as the cause of diseases classified elsewhere: Secondary | ICD-10-CM | POA: Insufficient documentation

## 2016-03-16 MED ORDER — METRONIDAZOLE 500 MG PO TABS: 500.0000 mg | ORAL_TABLET | Freq: Two times a day (BID) | ORAL | 0 refills | Status: DC

## 2016-03-16 NOTE — Telephone Encounter (Signed)
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 Patient will need repeat pap in one year. She was called and this was relayed to her. She expressed her understanding and acceptance of this plan. Additionally she was told of the BV which was found on wet prep and while is not having any symptoms, she decided to proceed with antibiotic treatment.  We also discussed her lumbar XR done for chronic lower back pain which was normal. She decided to get more exercise to help with her back pain likely due to muscular tightness  Ruth [name] A. Kennon RoundsHaney MD, MS Family Medicine Resident PGY-3 Pager [phone]

## 2016-03-25 ENCOUNTER — Encounter (HOSPITAL_COMMUNITY): Payer: Self-pay

## 2016-03-25 ENCOUNTER — Emergency Department (HOSPITAL_COMMUNITY)
Admission: EM | Admit: 2016-03-25 | Discharge: 2016-03-25 | Disposition: A | Discharge status: Home / Self Care | Payer: Medicaid Other | Attending: Emergency Medicine | Admitting: Emergency Medicine

## 2016-03-25 DIAGNOSIS — I10 Essential (primary) hypertension: Secondary | ICD-10-CM | POA: Insufficient documentation

## 2016-03-25 DIAGNOSIS — J45909 Unspecified asthma, uncomplicated: Secondary | ICD-10-CM | POA: Diagnosis not present

## 2016-03-25 DIAGNOSIS — K029 Dental caries, unspecified: Secondary | ICD-10-CM

## 2016-03-25 DIAGNOSIS — Z79899 Other long term (current) drug therapy: Secondary | ICD-10-CM | POA: Insufficient documentation

## 2016-03-25 DIAGNOSIS — K0889 Other specified disorders of teeth and supporting structures: Secondary | ICD-10-CM | POA: Diagnosis present

## 2016-03-25 DIAGNOSIS — F17210 Nicotine dependence, cigarettes, uncomplicated: Secondary | ICD-10-CM | POA: Insufficient documentation

## 2016-03-25 MED ORDER — LIDOCAINE VISCOUS 2 % MT SOLN: 15.0000 mL | OROMUCOSAL | 0 refills | Status: DC | PRN

## 2016-03-25 MED ORDER — BUPIVACAINE-EPINEPHRINE (PF) 0.5% -1:200000 IJ SOLN
1.8000 mL | Freq: Once | INTRAMUSCULAR | Status: AC
  Administered 2016-03-25: 1.8 mL
  Filled 2016-03-25: qty 1.8

## 2016-03-25 NOTE — ED Triage Notes (Signed)
Left lower dental pain x 2 days, states filling fell out.

## 2016-03-25 NOTE — Discharge Instructions (Signed)
You have been seen today for dental pain. He will need to follow up with a dentist as soon as possible. Use naproxen or ibuprofen to reduce pain and inflammation. Most dental pain is due to inflammation rather than infection. These medication should help reduce the inflammation when taken consistently. Use the viscous lidocaine as needed for mouth pain. Swish with the lidocaine and then spit it out. Do not swallow it.  Dental Resource Guide  [facility] [address], [address] [place], [place] [address] [phone]  [facility] [place] [address] [place], [place] [address] [phone]  [facility] [address] [place], [place] [address] [phone]) [phone]. [phone]  [facility] [address] [place], [place] [address] [phone]  [facility] [address] [place], [place] [address] [phone]) [phone]   [facility] [employer] [employer] Www.denistry.[employer].si[employer].edu/patientcare/studentclinics/becomepatient  [facility] [address] [facility], [place] [address] [phone]  Website for free, low-income, or sliding scale dental services in [place]: www.freedental.us  To find a dentist in [place] and surrounding areas: GuyGalaxy.siwww.ncdental.org/for-the-public/find-a-dentist  [facility] Test[employer].athttp://www.ncdental.org/meetings-events/[place]-missions-of-mercy  [facility] Medicaid Dentist http://www.[email or web address]/https://dma.ncdhhs.gov/find-a-doctor/medicaid-dental-providers

## 2016-03-25 NOTE — ED Provider Notes (Signed)
[facility] Provider Note   CSN: [id number] Arrival date & time: 03/25/16  1014  By signing my name below, I, Ruth [name], attest that this documentation has been prepared under the direction and in the presence of Navjot Loera, PA-C. Electronically Signed: Javier Docker, ER Scribe. 03/03/2016. 11:33 AM.  History   Chief Complaint Chief Complaint  Patient presents with  . Dental Pain   The history is provided by the patient. No language interpreter was used.    HPI Comments: Ruth [name] is a 35 y.o. female who presents to the Emergency Department complaining of moderate lower left tooth pain with an associated itching sensation after her filling fell out yesterday. She has taken 800mg  of ibuprofen one hour ago without relief. She has not seen a dentist in two years. Denies fever/chills, nausea/vomiting, difficulty breathing or swallowing, or any other complaints.    Past Medical History:  Diagnosis Date  . Asthma   . GERD (gastroesophageal reflux disease)   . Hypertension during pregnancy    Patient Active Problem List   Diagnosis Date Noted  . 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 Health maintenance examination 03/13/2016  . Back pain 03/13/2016  . Left knee injury 06/22/2014  . Obesity (BMI 30-39.9) 06/25/2013  . Contact dermatitis 06/15/2013  . Cerumen impaction 08/22/2012  . Birth control 03/14/2012  . Constipation 11/09/2011  . Left knee pain 07/09/2011  . 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 ASTHMA, INTERMITTENT 02/19/2007    Past Surgical History:  Procedure Laterality Date  . CHOLECYSTECTOMY  10/31/2011   Procedure: LAPAROSCOPIC CHOLECYSTECTOMY;  Surgeon: Liz Malady, MD;  [facility];  Service: General;  Laterality: N/A;  . DILATION AND CURETTAGE OF UTERUS    . WISDOM TOOTH EXTRACTION      OB History    No data available       Home Medications    Prior to Admission medications   Medication Sig Start Date End Date  Taking? Authorizing Provider  diclofenac (VOLTAREN) 75 MG EC tablet Take 1 tablet (75 mg total) by mouth 2 (two) times daily. Patient not taking: Reported on 06/20/2014 06/25/13   [name], MD  Elastic Bandages & Supports (ACE NEOPRENE KNEE BRACE) MISC 1 Units by Does not apply route daily. 06/22/14   Yolande Jolly, MD  ibuprofen (ADVIL,MOTRIN) 800 MG tablet Take 1 tablet (800 mg total) by mouth 3 (three) times daily. 06/20/14   Hannah Muthersbaugh, PA-C  lidocaine (XYLOCAINE) 2 % solution Use as directed 15 mLs in the mouth or throat as needed for mouth pain. 03/25/16   Finnick Orosz C Aws Shere, PA-C  medroxyPROGESTERone (DEPO-PROVERA) 150 MG/ML injection Inject 1 mL (150 mg total) into the muscle every 3 (three) months. 06/03/12   Shelly Rubenstein, MD  methocarbamol (ROBAXIN) 500 MG tablet Take 1 tablet (500 mg total) by mouth 2 (two) times daily. 06/20/14   Hannah Muthersbaugh, PA-C  metroNIDAZOLE (FLAGYL) 500 MG tablet Take 1 tablet (500 mg total) by mouth 2 (two) times daily. 03/16/16   Bonney Aid, MD  predniSONE (DELTASONE) 10 MG tablet Take 1 tablet (10 mg total) by mouth daily with breakfast. Patient not taking: Reported on 06/20/2014 06/15/13   Nani Ravens, MD  simethicone (MYLICON) 80 MG chewable tablet Chew 1 tablet (80 mg total) by mouth every 6 (six) hours as needed for flatulence. Patient not taking: Reported on 06/20/2014 11/02/11 11/12/11  Hilarie Fredrickson, MD    Family History  No family history on file.  Social History Social History  Substance Use Topics  . Smoking status: Current Every Day Smoker    Packs/day: 0.20    Types: Cigarettes, Cigars  . Smokeless tobacco: Never Used  . Alcohol use No     Allergies   Hydrocodone-acetaminophen   Review of Systems Review of Systems  Constitutional: Negative for chills and fever.  HENT: Positive for dental problem. Negative for trouble swallowing and voice change.   Gastrointestinal: Negative for nausea and vomiting.      Physical Exam Updated Vital Signs BP 115/84   Pulse 72   Temp 98.3 F (36.8 C) (Oral)   Resp 18   SpO2 100%   Physical Exam  Constitutional: She appears well-developed and well-nourished. No distress.  HENT:  Head: Normocephalic and atraumatic.  Dental carries into the dentin on the left mandibular second premolar with no area of swelling or fluctuance to suggest an abscess. Readily handles oral secretions without difficulty. No facial swelling. Able to open her mouth at least 3 finger widths.  Eyes: Conjunctivae are normal.  Neck: Neck supple.  Cardiovascular: Normal rate and regular rhythm.   Pulmonary/Chest: Effort normal.  Neurological: She is alert.  Skin: Skin is warm and dry. She is not diaphoretic.  Psychiatric: She has a normal mood and affect. Her behavior is normal.  Nursing note and vitals reviewed.    ED Treatments / Results  DIAGNOSTIC STUDIES: Oxygen Saturation is 100% on RA, normal by my interpretation.    COORDINATION OF CARE: 11:33 AM Discussed treatment plan with pt at bedside which includes nerve block and pt agreed to plan.  Labs (all labs ordered are listed, but only abnormal results are displayed) Labs Reviewed - No data to display  EKG  EKG Interpretation None       Radiology No results found.  Procedures .Nerve Block Date/Time: 03/25/2016 11:48 AM Performed by: Anselm PancoastJOY, Labresha Mellor C Authorized by: Anselm PancoastJOY, Mabell Esguerra C   Consent:    Consent obtained:  Verbal   Consent given by:  Patient   Risks discussed:  Allergic reaction, infection, bleeding, pain, nerve damage, swelling and unsuccessful block   Alternatives discussed:  No treatment and delayed treatment Indications:    Indications:  Pain relief Location:    Body area:  Head   Head nerve blocked: Inferior alveolar.   Laterality:  Left Procedure details (see MAR for exact dosages):    Block needle gauge:  27 G   Anesthetic injected:  Bupivacaine 0.5% WITH epi Post-procedure details:     Outcome:  Pain relieved   Patient tolerance of procedure:  Tolerated well, no immediate complications   (including critical care time)  Medications Ordered in ED Medications  bupivacaine-epinephrine (MARCAINE W/ EPI) 0.5% -1:200000 injection 1.8 mL (1.8 mLs Infiltration Given by Other 03/25/16 1144)     Initial Impression / Assessment and Plan / ED Course  I have reviewed the triage vital signs and the nursing notes.  Pertinent labs & imaging results that were available during my care of the patient were reviewed by me and considered in my medical decision making (see chart for details).  Clinical Course      Patient with dentalgia. No signs of abscess that would benefit from I&D. No signs of sepsis or Ludwig's angioedema. Tolerated dental block well. Patient to follow up with a dentist.     Final Clinical Impressions(s) / ED Diagnoses   Final diagnoses:  Pain due to dental caries  New Prescriptions Discharge Medication List as of 03/25/2016 11:55 AM    START taking these medications   Details  lidocaine (XYLOCAINE) 2 % solution Use as directed 15 mLs in the mouth or throat as needed for mouth pain., Starting Sun 03/25/2016, Print          I personally performed the services described in this documentation, which was scribed in my presence. The recorded information has been reviewed and is accurate.   Anselm Pancoast, PA-C 03/26/16 1716    Doug Sou, MD 03/26/16 [phone]

## 2016-03-25 NOTE — ED Notes (Signed)
C/O LEFT LOWER DENTAL PAIN. STATES 'LOST A FILLING' YESTERDAY.

## 2016-03-29 ENCOUNTER — Telehealth: Payer: Self-pay | Admitting: Student

## 2016-03-29 NOTE — Telephone Encounter (Signed)
Pt is having headaches, stomach is gassy, mouth is hurting. Her tooth is infected.  She was given amoxicillian by the dentist.  She cannot take pain meds and work.  She says she hadnt eaten in four days. She doesn't have the surgery scheduled until oct. She just has no clue if the antibotic is right.  She says she is just a mess and doesn't know what to do. Please advise

## 2016-06-06 ENCOUNTER — Ambulatory Visit (INDEPENDENT_AMBULATORY_CARE_PROVIDER_SITE_OTHER): Payer: Medicaid Other | Admitting: Obstetrics & Gynecology

## 2016-06-06 ENCOUNTER — Encounter: Payer: Self-pay | Admitting: Obstetrics & Gynecology

## 2016-06-06 VITALS — BP 123/85 | HR 77 | Wt 204.3 lb

## 2016-06-06 DIAGNOSIS — Z30013 Encounter for initial prescription of injectable contraceptive: Secondary | ICD-10-CM

## 2016-06-06 DIAGNOSIS — Z3202 Encounter for pregnancy test, result negative: Secondary | ICD-10-CM

## 2016-06-06 DIAGNOSIS — Z3009 Encounter for other general counseling and advice on contraception: Secondary | ICD-10-CM

## 2016-06-06 LAB — POCT PREGNANCY, URINE: PREG TEST UR: NEGATIVE

## 2016-06-06 MED ORDER — MEDROXYPROGESTERONE ACETATE 150 MG/ML IM SUSP
150.0000 mg | INTRAMUSCULAR | Status: DC
  Administered 2016-06-06: 150 mg via INTRAMUSCULAR

## 2016-06-06 MED ORDER — MEDROXYPROGESTERONE ACETATE 150 MG/ML IM SUSP: 150.0000 mg | INTRAMUSCULAR | 4 refills | Status: DC

## 2016-06-06 NOTE — Progress Notes (Signed)
Patient ID: Ruth [name], female   DOB: 19-Nov-1980, 35 y.o.   MRN: [id number]  Chief Complaint  Patient presents with  . Contraception  . Advice Only    wants BTL    HPI Ruth [name] is a [age] y.o. female.  No obstetric history on file. No LMP recorded. Patient has had an injection. DMPA was 4 mo ago, plans to schedule BTL. Has used condoms but inconsistent since she was due for her injection HPI  Past Medical History:  Diagnosis Date  . Asthma   . GERD (gastroesophageal reflux disease)   . Hypertension during pregnancy    Past Surgical History:  Procedure Laterality Date  . CHOLECYSTECTOMY  10/31/2011   Procedure: LAPAROSCOPIC CHOLECYSTECTOMY;  Surgeon: Liz MaladyBurke E Thompson, MD;  [facility];  Service: General;  Laterality: N/A;  . DILATION AND CURETTAGE OF UTERUS    . WISDOM TOOTH EXTRACTION      No family history on file.  Social History Social History  Substance Use Topics  . Smoking status: Current Every Day Smoker    Packs/day: 0.20    Types: Cigarettes, Cigars  . Smokeless tobacco: Never Used  . Alcohol use No    Allergies  Allergen Reactions  . Hydrocodone-Acetaminophen Nausea And Vomiting and Swelling    Current Outpatient Prescriptions  Medication Sig Dispense Refill  . diclofenac (VOLTAREN) 75 MG EC tablet Take 1 tablet (75 mg total) by mouth 2 (two) times daily. (Patient not taking: Reported on 06/06/2016) 30 tablet 0  . Elastic Bandages & Supports (ACE NEOPRENE KNEE BRACE) MISC 1 Units by Does not apply route daily. (Patient not taking: Reported on 06/06/2016) 1 each 0  . ibuprofen (ADVIL,MOTRIN) 800 MG tablet Take 1 tablet (800 mg total) by mouth 3 (three) times daily. (Patient not taking: Reported on 06/06/2016) 21 tablet 0  . lidocaine (XYLOCAINE) 2 % solution Use as directed 15 mLs in the mouth or throat as needed for mouth pain. (Patient not taking: Reported on 06/06/2016) 100 mL 0  . medroxyPROGESTERone (DEPO-PROVERA) 150 MG/ML injection Inject 1  mL (150 mg total) into the muscle every 3 (three) months. 1 mL 4  . methocarbamol (ROBAXIN) 500 MG tablet Take 1 tablet (500 mg total) by mouth 2 (two) times daily. (Patient not taking: Reported on 06/06/2016) 20 tablet 0  . metroNIDAZOLE (FLAGYL) 500 MG tablet Take 1 tablet (500 mg total) by mouth 2 (two) times daily. (Patient not taking: Reported on 06/06/2016) 14 tablet 0  . predniSONE (DELTASONE) 10 MG tablet Take 1 tablet (10 mg total) by mouth daily with breakfast. (Patient not taking: Reported on 06/20/2014) 7 tablet 0  . simethicone (MYLICON) 80 MG chewable tablet Chew 1 tablet (80 mg total) by mouth every 6 (six) hours as needed for flatulence. (Patient not taking: Reported on 06/20/2014) 30 tablet 0   No current facility-administered medications for this visit.     Review of Systems Review of Systems  Constitutional: Negative.   Respiratory: Negative.   Gastrointestinal: Negative.   Genitourinary: Negative.     Blood pressure 123/85, pulse 77, weight 204 lb 4.8 oz (92.7 kg).  Physical Exam Physical Exam  Constitutional: She appears well-developed. No distress.  Cardiovascular: Normal rate.   Pulmonary/Chest: She is in respiratory distress.  Psychiatric: She has a normal mood and affect. Her behavior is normal.  Vitals reviewed.   Data Reviewed Adequacy Reason Satisfactory for evaluation, endocervical/transformation zone component PRESENT. Diagnosis NEGATIVE FOR INTRAEPITHELIAL LESIONS OR MALIGNANCY. [facility] Health visitorCytotechnologist Electronic Signature (  Case signed 03/14/2016) Source CervicoVaginal Pap [ThinPrep Imaged] Molecular Results Test Result HPV High Risk DETECTED   Assessment    Desires sterilization Overdue for DMPA, may receive today    Plan    Schedule LBTL, sin\gn medicaid form DMPA injection, avoid intercourse for next 2 weeks       Jamez Ambrocio 06/06/2016, 10:00 AM

## 2016-06-11 ENCOUNTER — Encounter (HOSPITAL_COMMUNITY): Payer: Self-pay | Admitting: *Deleted

## 2016-06-22 ENCOUNTER — Other Ambulatory Visit (HOSPITAL_COMMUNITY)
Admission: RE | Admit: 2016-06-22 | Discharge: 2016-06-22 | Disposition: A | Discharge status: Home / Self Care | Payer: Medicaid Other | Source: Ambulatory Visit | Attending: Family Medicine | Admitting: Family Medicine

## 2016-06-22 ENCOUNTER — Encounter: Payer: Self-pay | Admitting: Family Medicine

## 2016-06-22 ENCOUNTER — Ambulatory Visit (INDEPENDENT_AMBULATORY_CARE_PROVIDER_SITE_OTHER): Payer: Medicaid Other | Admitting: Family Medicine

## 2016-06-22 VITALS — BP 120/82 | HR 100 | Temp 98.4°F | Ht 66.0 in | Wt 199.6 lb

## 2016-06-22 DIAGNOSIS — N898 Other specified noninflammatory disorders of vagina: Secondary | ICD-10-CM

## 2016-06-22 DIAGNOSIS — Z23 Encounter for immunization: Secondary | ICD-10-CM

## 2016-06-22 DIAGNOSIS — Z113 Encounter for screening for infections with a predominantly sexual mode of transmission: Secondary | ICD-10-CM | POA: Diagnosis not present

## 2016-06-22 LAB — POCT WET PREP (WET MOUNT)
Clue Cells Wet Prep Whiff POC: NEGATIVE
TRICHOMONAS WET PREP HPF POC: ABSENT

## 2016-06-22 NOTE — Progress Notes (Signed)
   Subjective:  Ruth [name] is a 35 y.o. female who [facility] with a chief complaint of vaginal discharge.   HPI:  Vaginal Discharge. Started 3 days ago. Described as white and clumpy. Mild vaginal irritation. Discharge has a slight odor. No recent antibiotics. Not sexually active. No fevers or chills. No abdominal pain.   ROS: Per HPI  PMH: Smoking history reviewed.    Objective:  Physical Exam: BP 120/82 (BP Location: Left Arm, Patient Position: Sitting, Cuff Size: Normal)   Pulse 100   Temp 98.4 F (36.9 C) (Oral)   Ht 5\' 6"  (1.676 m)   Wt 199 lb 9.6 oz (90.5 kg)   BMI 32.22 kg/m   Gen: NAD, resting comfortably Pulm: NWOB GI: Normal bowel sounds present. Soft, Nontender, Nondistended. GU: Normal external female genitalia. Scant amount of white discharge at cervical os. No lesions noted.  MSK: no edema, cyanosis, or clubbing noted Skin: warm, dry Neuro: grossly normal, moves all extremities Psych: Normal affect and thought content  Results for orders placed or performed in visit on 06/22/16 (from the past 72 hour(s))  POCT Wet Prep [facility] [place])     Status: None   Collection Time: 06/22/16 11:10 AM  Result Value Ref Range   Source Wet Prep POC Vagina    WBC, Wet Prep HPF POC Occasional    Bacteria Wet Prep HPF POC Few Few   Clue Cells Wet Prep HPF POC None None   Clue Cells Wet Prep Whiff POC Negative Whiff    Yeast Wet Prep HPF POC None    Trichomonas Wet Prep HPF POC Absent Absent    Assessment/Plan:  Vaginal Discharge.  Wet prep negative. GC/CT sent. HIV/RPR pending. Return precautions reviewed. Follow up if testing negative and discharge not improving.  Katina Degreealeb M. Jimmey RalphParker, MD [facility] Family Medicine Resident PGY-3 06/22/2016 11:44 AM

## 2016-06-22 NOTE — Patient Instructions (Signed)
Your swab did not show yeast or BV. Your other tests will come back next week.  If your symptoms do not improve, please let us know.  Take care,  Dr Jimmey RalphParker

## 2016-06-23 LAB — RPR

## 2016-06-23 LAB — HIV ANTIBODY (ROUTINE TESTING W REFLEX): HIV 1&2 Ab, 4th Generation: NONREACTIVE

## 2016-06-25 LAB — CERVICOVAGINAL ANCILLARY ONLY
CHLAMYDIA, DNA PROBE: NEGATIVE
Neisseria Gonorrhea: NEGATIVE

## 2016-06-26 ENCOUNTER — Encounter: Payer: Self-pay | Admitting: Family Medicine

## 2016-07-17 ENCOUNTER — Encounter (HOSPITAL_COMMUNITY): Payer: Self-pay | Admitting: *Deleted

## 2016-07-24 ENCOUNTER — Ambulatory Visit (HOSPITAL_COMMUNITY)
Admission: RE | Admit: 2016-07-24 | Discharge: 2016-07-24 | Disposition: A | Discharge status: Home / Self Care | Payer: Medicaid Other | Source: Ambulatory Visit | Attending: Obstetrics & Gynecology | Admitting: Obstetrics & Gynecology

## 2016-07-24 ENCOUNTER — Ambulatory Visit (HOSPITAL_COMMUNITY): Payer: Medicaid Other | Admitting: Anesthesiology

## 2016-07-24 ENCOUNTER — Other Ambulatory Visit: Payer: Self-pay | Admitting: Obstetrics & Gynecology

## 2016-07-24 ENCOUNTER — Encounter (HOSPITAL_COMMUNITY): Payer: Self-pay | Admitting: Anesthesiology

## 2016-07-24 ENCOUNTER — Encounter (HOSPITAL_COMMUNITY)
Admission: RE | Disposition: A | Discharge status: Home / Self Care | Payer: Self-pay | Source: Ambulatory Visit | Attending: Obstetrics & Gynecology

## 2016-07-24 DIAGNOSIS — F17210 Nicotine dependence, cigarettes, uncomplicated: Secondary | ICD-10-CM | POA: Insufficient documentation

## 2016-07-24 DIAGNOSIS — I10 Essential (primary) hypertension: Secondary | ICD-10-CM | POA: Insufficient documentation

## 2016-07-24 DIAGNOSIS — J45909 Unspecified asthma, uncomplicated: Secondary | ICD-10-CM | POA: Insufficient documentation

## 2016-07-24 DIAGNOSIS — Z302 Encounter for sterilization: Secondary | ICD-10-CM

## 2016-07-24 DIAGNOSIS — Z79899 Other long term (current) drug therapy: Secondary | ICD-10-CM | POA: Insufficient documentation

## 2016-07-24 DIAGNOSIS — K219 Gastro-esophageal reflux disease without esophagitis: Secondary | ICD-10-CM | POA: Diagnosis not present

## 2016-07-24 HISTORY — PX: LAPAROSCOPIC TUBAL LIGATION: SHX1937

## 2016-07-24 HISTORY — DX: Pneumonia, unspecified organism: J18.9

## 2016-07-24 LAB — CBC
HCT: 45.2 % (ref 36.0–46.0)
Hemoglobin: 15.4 g/dL — ABNORMAL HIGH (ref 12.0–15.0)
MCH: 28.4 pg (ref 26.0–34.0)
MCHC: 34.1 g/dL (ref 30.0–36.0)
MCV: 83.4 fL (ref 78.0–100.0)
Platelets: 281 K/uL (ref 150–400)
RBC: 5.42 MIL/uL — ABNORMAL HIGH (ref 3.87–5.11)
RDW: 13.2 % (ref 11.5–15.5)
WBC: 5.9 K/uL (ref 4.0–10.5)

## 2016-07-24 LAB — PREGNANCY, URINE: Preg Test, Ur: NEGATIVE

## 2016-07-24 SURGERY — LIGATION, FALLOPIAN TUBE, LAPAROSCOPIC
Anesthesia: General | Site: Abdomen | Laterality: Bilateral

## 2016-07-24 MED ORDER — SUGAMMADEX SODIUM 500 MG/5ML IV SOLN
INTRAVENOUS | Status: AC
  Filled 2016-07-24: qty 5

## 2016-07-24 MED ORDER — ONDANSETRON HCL 4 MG/2ML IJ SOLN
INTRAMUSCULAR | Status: AC
  Filled 2016-07-24: qty 2

## 2016-07-24 MED ORDER — KETOROLAC TROMETHAMINE 30 MG/ML IJ SOLN
INTRAMUSCULAR | Status: DC | PRN
  Administered 2016-07-24: 30 mg via INTRAVENOUS

## 2016-07-24 MED ORDER — ACETAMINOPHEN-CODEINE #3 300-30 MG PO TABS: 1.0000 | ORAL_TABLET | Freq: Four times a day (QID) | ORAL | 0 refills | Status: DC | PRN

## 2016-07-24 MED ORDER — ROCURONIUM BROMIDE 100 MG/10ML IV SOLN
INTRAVENOUS | Status: AC
  Filled 2016-07-24: qty 1

## 2016-07-24 MED ORDER — LIDOCAINE HCL (CARDIAC) 20 MG/ML IV SOLN
INTRAVENOUS | Status: DC | PRN
  Administered 2016-07-24: 70 mg via INTRAVENOUS
  Administered 2016-07-24: 30 mg via INTRAVENOUS

## 2016-07-24 MED ORDER — MIDAZOLAM HCL 2 MG/2ML IJ SOLN
INTRAMUSCULAR | Status: AC
  Filled 2016-07-24: qty 2

## 2016-07-24 MED ORDER — DEXAMETHASONE SODIUM PHOSPHATE 10 MG/ML IJ SOLN
INTRAMUSCULAR | Status: DC | PRN
  Administered 2016-07-24: 4 mg via INTRAVENOUS

## 2016-07-24 MED ORDER — FENTANYL CITRATE (PF) 100 MCG/2ML IJ SOLN
INTRAMUSCULAR | Status: DC
Start: 2016-07-24 — End: 2016-07-24
  Filled 2016-07-24: qty 2

## 2016-07-24 MED ORDER — PROPOFOL 10 MG/ML IV BOLUS
INTRAVENOUS | Status: DC | PRN
  Administered 2016-07-24: 200 mg via INTRAVENOUS

## 2016-07-24 MED ORDER — DEXAMETHASONE SODIUM PHOSPHATE 4 MG/ML IJ SOLN
INTRAMUSCULAR | Status: AC
  Filled 2016-07-24: qty 1

## 2016-07-24 MED ORDER — SCOPOLAMINE 1 MG/3DAYS TD PT72
1.0000 | MEDICATED_PATCH | Freq: Once | TRANSDERMAL | Status: DC
  Administered 2016-07-24: 1.5 mg via TRANSDERMAL

## 2016-07-24 MED ORDER — KETOROLAC TROMETHAMINE 30 MG/ML IJ SOLN
INTRAMUSCULAR | Status: AC
  Filled 2016-07-24: qty 1

## 2016-07-24 MED ORDER — FENTANYL CITRATE (PF) 100 MCG/2ML IJ SOLN
INTRAMUSCULAR | Status: AC
  Administered 2016-07-24: 50 ug via INTRAVENOUS
  Filled 2016-07-24: qty 2

## 2016-07-24 MED ORDER — ACETAMINOPHEN 160 MG/5ML PO SOLN
ORAL | Status: AC
  Filled 2016-07-24: qty 40.6

## 2016-07-24 MED ORDER — TRAMADOL HCL 50 MG PO TABS
50.0000 mg | ORAL_TABLET | Freq: Once | ORAL | Status: AC
  Administered 2016-07-24: 50 mg via ORAL

## 2016-07-24 MED ORDER — FENTANYL CITRATE (PF) 100 MCG/2ML IJ SOLN
INTRAMUSCULAR | Status: AC
  Filled 2016-07-24: qty 2

## 2016-07-24 MED ORDER — SCOPOLAMINE 1 MG/3DAYS TD PT72
MEDICATED_PATCH | TRANSDERMAL | Status: AC
  Administered 2016-07-24: 1.5 mg via TRANSDERMAL
  Filled 2016-07-24: qty 1

## 2016-07-24 MED ORDER — ONDANSETRON HCL 4 MG/2ML IJ SOLN
INTRAMUSCULAR | Status: DC | PRN
  Administered 2016-07-24: 4 mg via INTRAVENOUS

## 2016-07-24 MED ORDER — BUPIVACAINE HCL (PF) 0.25 % IJ SOLN
INTRAMUSCULAR | Status: AC
  Filled 2016-07-24: qty 30

## 2016-07-24 MED ORDER — TRAMADOL HCL 50 MG PO TABS
ORAL_TABLET | ORAL | Status: AC
  Filled 2016-07-24: qty 1

## 2016-07-24 MED ORDER — BUPIVACAINE HCL (PF) 0.25 % IJ SOLN
INTRAMUSCULAR | Status: DC | PRN
  Administered 2016-07-24: 10 mL

## 2016-07-24 MED ORDER — MIDAZOLAM HCL 2 MG/2ML IJ SOLN
INTRAMUSCULAR | Status: DC | PRN
  Administered 2016-07-24: 1 mg via INTRAVENOUS

## 2016-07-24 MED ORDER — LIDOCAINE HCL (CARDIAC) 20 MG/ML IV SOLN
INTRAVENOUS | Status: AC
  Filled 2016-07-24: qty 5

## 2016-07-24 MED ORDER — PROPOFOL 10 MG/ML IV BOLUS
INTRAVENOUS | Status: AC
  Filled 2016-07-24: qty 20

## 2016-07-24 MED ORDER — SUGAMMADEX SODIUM 500 MG/5ML IV SOLN
INTRAVENOUS | Status: DC | PRN
  Administered 2016-07-24: 372 mg via INTRAVENOUS

## 2016-07-24 MED ORDER — FENTANYL CITRATE (PF) 100 MCG/2ML IJ SOLN
INTRAMUSCULAR | Status: DC | PRN
Start: 2016-07-24 — End: 2016-07-24
  Administered 2016-07-24 (×2): 50 ug via INTRAVENOUS

## 2016-07-24 MED ORDER — FENTANYL CITRATE (PF) 100 MCG/2ML IJ SOLN
25.0000 ug | INTRAMUSCULAR | Status: DC | PRN
  Administered 2016-07-24 (×5): 50 ug via INTRAVENOUS

## 2016-07-24 MED ORDER — ROCURONIUM BROMIDE 100 MG/10ML IV SOLN
INTRAVENOUS | Status: DC | PRN
  Administered 2016-07-24: 40 mg via INTRAVENOUS

## 2016-07-24 MED ORDER — ACETAMINOPHEN 160 MG/5ML PO SOLN
975.0000 mg | Freq: Once | ORAL | Status: AC
  Administered 2016-07-24: 975 mg via ORAL

## 2016-07-24 MED ORDER — LACTATED RINGERS IV SOLN
INTRAVENOUS | Status: DC
  Administered 2016-07-24: 125 mL/h via INTRAVENOUS
  Administered 2016-07-24: 10:00:00 via INTRAVENOUS

## 2016-07-24 MED ORDER — TRAMADOL-ACETAMINOPHEN 37.5-325 MG PO TABS: 1.0000 | ORAL_TABLET | Freq: Four times a day (QID) | ORAL | 0 refills | Status: DC | PRN

## 2016-07-24 SURGICAL SUPPLY — 21 items
CATH ROBINSON RED A/P 16FR (CATHETERS) ×3 IMPLANT
CLIP FILSHIE TUBAL LIGA STRL (Clip) ×3 IMPLANT
CLOTH BEACON ORANGE TIMEOUT ST (SAFETY) ×3 IMPLANT
DERMABOND ADVANCED (GAUZE/BANDAGES/DRESSINGS) ×2
DERMABOND ADVANCED .7 DNX12 (GAUZE/BANDAGES/DRESSINGS) ×1 IMPLANT
DRSG OPSITE POSTOP 3X4 (GAUZE/BANDAGES/DRESSINGS) ×3 IMPLANT
DURAPREP 26ML APPLICATOR (WOUND CARE) ×3 IMPLANT
GLOVE BIO SURGEON STRL SZ 6.5 (GLOVE) ×2 IMPLANT
GLOVE BIO SURGEONS STRL SZ 6.5 (GLOVE) ×1
GLOVE BIOGEL PI IND STRL 7.0 (GLOVE) ×2 IMPLANT
GLOVE BIOGEL PI INDICATOR 7.0 (GLOVE) ×4
GOWN STRL REUS W/TWL LRG LVL3 (GOWN DISPOSABLE) ×6 IMPLANT
NEEDLE INSUFFLATION 120MM (ENDOMECHANICALS) ×3 IMPLANT
PACK LAPAROSCOPY BASIN (CUSTOM PROCEDURE TRAY) ×3 IMPLANT
SET IRRIG TUBING LAPAROSCOPIC (IRRIGATION / IRRIGATOR) IMPLANT
SUT VICRYL 0 UR6 27IN ABS (SUTURE) ×3 IMPLANT
SUT VICRYL 4-0 PS2 18IN ABS (SUTURE) ×3 IMPLANT
TOWEL OR 17X24 6PK STRL BLUE (TOWEL DISPOSABLE) ×6 IMPLANT
TROCAR XCEL DIL TIP R 11M (ENDOMECHANICALS) ×3 IMPLANT
WARMER LAPAROSCOPE (MISCELLANEOUS) ×3 IMPLANT
WATER STERILE IRR 1000ML POUR (IV SOLUTION) ×3 IMPLANT

## 2016-07-24 NOTE — Discharge Instructions (Signed)
Laparoscopic Tubal Ligation, Care After Refer to this sheet in the next few weeks. These instructions provide you with information about caring for yourself after your procedure. Your health care provider may also give you more specific instructions. Your treatment has been planned according to current medical practices, but problems sometimes occur. Call your health care provider if you have any problems or questions after your procedure. What can I expect after the procedure? After the procedure, it is common to have:  A sore throat.  Discomfort in your shoulder.  Mild discomfort or cramping in your abdomen.  Gas pains.  Pain or soreness in the area where the surgical cut (incision) was made.  A bloated feeling.  Tiredness.  Nausea.  Vomiting. Follow these instructions at home: Medicines  Take over-the-counter and prescription medicines only as told by your health care provider.  Do not take aspirin because it can cause bleeding.  Do not drive or operate heavy machinery while taking prescription pain medicine. Activity  Rest for the rest of the day.  Return to your normal activities as told by your health care provider. Ask your health care provider what activities are safe for you. Incision care  Follow instructions from your health care provider about how to take care of your incision. Make sure you:  Wash your hands with soap and water before you change your bandage (dressing). If soap and water are not available, use hand sanitizer.  Change your dressing as told by your health care provider.  Leave stitches (sutures) in place. They may need to stay in place for 2 weeks or longer.  Check your incision area every day for signs of infection. Check for:  More redness, swelling, or pain.  More fluid or blood.  Warmth.  Pus or a bad smell. Other Instructions  Do not take baths, swim, or use a hot tub until your health care provider approves. You may take  showers.  Keep all follow-up visits as told by your health care provider. This is important.  Have someone help you with your daily household tasks for the first few days. Contact a health care provider if:  You have more redness, swelling, or pain around your incision.  Your incision feels warm to the touch.  You have pus or a bad smell coming from your incision.  The edges of your incision break open after the sutures have been removed.  Your pain does not improve after 2-3 days.  You have a rash.  You repeatedly become dizzy or light-headed.  Your pain medicine is not helping.  You are constipated. Get help right away if:  You have a fever.  You faint.  You have increasing pain in your abdomen.  You have severe pain in one or both of your shoulders.  You have fluid or blood coming from your sutures or from your vagina.  You have shortness of breath or difficulty breathing.  You have chest pain or leg pain.  You have ongoing nausea, vomiting, or diarrhea. This information is not intended to replace advice given to you by your health care provider. Make sure you discuss any questions you have with your health care provider. Document Released: 01/26/2005 Document Revised: 12/12/2015 Document Reviewed: 06/19/2015 Elsevier Interactive Patient Education  2017 [employer]   DISCHARGE INSTRUCTIONS: Laparoscopy  The following instructions have been prepared to help you care for yourself upon your return home today.  Wound care:  Do not get the incision wet for the first 24  hours. The incision should be kept clean and dry.  The Band-Aids or dressings may be removed the day after surgery.  Should the incision become sore, red, and swollen after the first week, check with your doctor.  Personal hygiene:  Shower the day after your procedure.  Activity and limitations:  Do NOT drive or operate any equipment today.  Do NOT lift anything more than 15 pounds for  2-3 weeks after surgery.  Do NOT rest in bed all day.  Walking is encouraged. Walk each day, starting slowly with 5-minute walks 3 or 4 times a day. Slowly increase the length of your walks.  Walk up and down stairs slowly.  Do NOT do strenuous activities, such as golfing, playing tennis, bowling, running, biking, weight lifting, gardening, mowing, or vacuuming for 2-4 weeks. Ask your doctor when it is okay to start.  Diet: Eat a light meal as desired this evening. You may resume your usual diet tomorrow.  Return to work: This is dependent on the type of work you do. For the most part you can return to a desk job within a week of surgery. If you are more active at work, please discuss this with your doctor.  What to expect after your surgery: You may have a slight burning sensation when you urinate on the first day. You may have a very small amount of blood in the urine. Expect to have a small amount of vaginal discharge/light bleeding for 1-2 weeks. It is not unusual to have abdominal soreness and bruising for up to 2 weeks. You may be tired and need more rest for about 1 week. You may experience shoulder pain for 24-72 hours. Lying flat in bed may relieve it.  Call your doctor for any of the following:  Develop a fever of 100.4 or greater  Inability to urinate 6 hours after discharge from hospital  Severe pain not relieved by pain medications  Persistent of heavy bleeding at incision site  Redness or swelling around incision site after a week  Increasing nausea or vomiting  Patient Signature________________________________________ Nurse Signature_________________________________________    Post Anesthesia Home Care Instructions  Activity: Get plenty of rest for the remainder of the day. A responsible adult should stay with you for 24 hours following the procedure.  For the next 24 hours, DO NOT: -Drive a car -Advertising copywriterperate machinery -Drink alcoholic beverages -Take any  medication unless instructed by your physician -Make any legal decisions or sign important papers.  Meals: Start with liquid foods such as gelatin or soup. Progress to regular foods as tolerated. Avoid greasy, spicy, heavy foods. If nausea and/or vomiting occur, drink only clear liquids until the nausea and/or vomiting subsides. Call your physician if vomiting continues.  Special Instructions/Symptoms: Your throat may feel dry or sore from the anesthesia or the breathing tube placed in your throat during surgery. If this causes discomfort, gargle with warm salt water. The discomfort should disappear within 24 hours.  If you had a scopolamine patch placed behind your ear for the management of post- operative nausea and/or vomiting:  1. The medication in the patch is effective for 72 hours, after which it should be removed.  Wrap patch in a tissue and discard in the trash. Wash hands thoroughly with soap and water. 2. You may remove the patch earlier than 72 hours if you experience unpleasant side effects which may include dry mouth, dizziness or visual disturbances. 3. Avoid touching the patch. Wash your hands with soap and water  patch. °  ° ° °

## 2016-07-24 NOTE — H&P (Addendum)
Chief Complaint  Patient presents with  . Contraception       wants BTL    HPI Ruth [name] is a 36 y.o. female.  [id number]  No LMP recorded. Patient has had an injection. DMPA was 6 weeks ago, scheduled for BTL. HPI      Past Medical History:  Diagnosis Date  . Asthma   . GERD (gastroesophageal reflux disease)   . Hypertension during pregnancy         Past Surgical History:  Procedure Laterality Date  . CHOLECYSTECTOMY  10/31/2011   Procedure: LAPAROSCOPIC CHOLECYSTECTOMY;  Surgeon: Liz Malady, MD;  [facility];  Service: General;  Laterality: N/A;  . DILATION AND CURETTAGE OF UTERUS    . WISDOM TOOTH EXTRACTION      No family history on file.  Social History      Social History  Substance Use Topics  . Smoking status: Current Every Day Smoker    Packs/day: 0.20    Types: Cigarettes, Cigars  . Smokeless tobacco: Never Used  . Alcohol use No        Allergies  Allergen Reactions  . Hydrocodone-Acetaminophen Nausea And Vomiting and Swelling          Current Outpatient Prescriptions  Medication Sig Dispense Refill  . diclofenac (VOLTAREN) 75 MG EC tablet Take 1 tablet (75 mg total) by mouth 2 (two) times daily. (Patient not taking: Reported on 06/06/2016) 30 tablet 0  . Elastic Bandages & Supports (ACE NEOPRENE KNEE BRACE) MISC 1 Units by Does not apply route daily. (Patient not taking: Reported on 06/06/2016) 1 each 0  . ibuprofen (ADVIL,MOTRIN) 800 MG tablet Take 1 tablet (800 mg total) by mouth 3 (three) times daily. (Patient not taking: Reported on 06/06/2016) 21 tablet 0  . lidocaine (XYLOCAINE) 2 % solution Use as directed 15 mLs in the mouth or throat as needed for mouth pain. (Patient not taking: Reported on 06/06/2016) 100 mL 0  . medroxyPROGESTERone (DEPO-PROVERA) 150 MG/ML injection Inject 1 mL (150 mg total) into the muscle every 3 (three) months. 1 mL 4  . methocarbamol (ROBAXIN) 500 MG tablet Take 1 tablet (500 mg  total) by mouth 2 (two) times daily. (Patient not taking: Reported on 06/06/2016) 20 tablet 0  . metroNIDAZOLE (FLAGYL) 500 MG tablet Take 1 tablet (500 mg total) by mouth 2 (two) times daily. (Patient not taking: Reported on 06/06/2016) 14 tablet 0  . predniSONE (DELTASONE) 10 MG tablet Take 1 tablet (10 mg total) by mouth daily with breakfast. (Patient not taking: Reported on 06/20/2014) 7 tablet 0  . simethicone (MYLICON) 80 MG chewable tablet Chew 1 tablet (80 mg total) by mouth every 6 (six) hours as needed for flatulence. (Patient not taking: Reported on 06/20/2014) 30 tablet 0   No current facility-administered medications for this visit.     Review of Systems Review of Systems  Constitutional: Negative.   Respiratory: Negative.   Gastrointestinal: Negative.   Genitourinary: Negative.     Blood pressure 119/84, pulse 73, temperature 98.2 F (36.8 C), temperature source Oral, resp. rate 16, height 5\' 6"  (1.676 m), weight 93 kg (205 lb), SpO2 100 %.   Physical Exam Physical Exam  Constitutional: She appears well-developed. No distress.  Cardiovascular: Normal rate.   Pulmonary/Chest: normal effort. No distress Psychiatric: She has a normal mood and affect. Her behavior is normal.  Vitals reviewed.   Data Reviewed Adequacy Reason Satisfactory for evaluation, endocervical/transformation zone component PRESENT. Diagnosis NEGATIVE FOR INTRAEPITHELIAL LESIONS  OR MALIGNANCY. TANYA SPEED Cytotechnologist Electronic Signature (Case signed 03/14/2016) Source CervicoVaginal Pap [ThinPrep Imaged] Molecular Results Test Result HPV High Risk DETECTED   Assessment    Desires sterilization     Plan   36 y.o.  with undesired fertility desires permanent sterilization. Risks and benefits of laparoscopic tubal sterilization procedure was discussed with the patient including permanence of method, bleeding, infection, injury to surrounding organs, anesthesia and need for  additional procedures. Risk failure of 0.5-1% with increased risk of ectopic gestation if pregnancy occurs was also discussed with patient. Patient verbalized understanding and all questions were answered.  Currie ParisJames G. Debroah LoopArnold MD  07/24/2016

## 2016-07-24 NOTE — Transfer of Care (Signed)
Immediate Anesthesia Transfer of Care Note  Patient: Ruth [name]  Procedure(s) Performed: Procedure(s): LAPAROSCOPIC TUBAL LIGATION (Bilateral)  Patient Location: PACU  Anesthesia Type:General  Level of Consciousness: awake, sedated and patient cooperative  Airway & Oxygen Therapy: Patient Spontanous Breathing and Patient connected to nasal cannula oxygen  Post-op Assessment: Report given to RN and Post -op Vital signs reviewed and stable  Post vital signs: Reviewed and stable  Last Vitals:  Vitals:   07/24/16 0723  BP: 119/84  Pulse: 73  Resp: 16  Temp: 36.8 C    Last Pain:  Vitals:   07/24/16 0723  TempSrc: Oral      Patients Stated Pain Goal: 3 (07/24/16 0723)  Complications: No apparent anesthesia complications

## 2016-07-24 NOTE — Anesthesia Procedure Notes (Signed)
Procedure Name: Intubation Date/Time: 07/24/2016 8:25 AM Performed by: Tobin Chad Pre-anesthesia Checklist: Patient identified, Emergency Drugs available, Suction available and Patient being monitored Patient Re-evaluated:Patient Re-evaluated prior to inductionOxygen Delivery Method: Circle system utilized and Simple face mask Preoxygenation: Pre-oxygenation with 100% oxygen Intubation Type: IV induction and Inhalational induction Ventilation: Mask ventilation without difficulty Laryngoscope Size: Mac and 3 Grade View: Grade II Tube type: Oral Tube size: 7.0 mm Number of attempts: 1 Airway Equipment and Method: Stylet Placement Confirmation: positive ETCO2,  ETT inserted through vocal cords under direct vision and CO2 detector Secured at: 22 cm Tube secured with: Tape Dental Injury: Teeth and Oropharynx as per pre-operative assessment

## 2016-07-24 NOTE — Anesthesia Preprocedure Evaluation (Signed)
Anesthesia Evaluation  Patient identified by MRN, date of birth, ID band Patient awake    Reviewed: Allergy & Precautions, H&P , Patient's Chart, lab work & pertinent test results, reviewed documented beta blocker date and time   Airway Mallampati: II  TM Distance: >3 FB Neck ROM: full    Dental no notable dental hx.    Pulmonary asthma , Current Smoker,    Pulmonary exam normal breath sounds clear to auscultation       Cardiovascular hypertension,  Rhythm:regular Rate:Normal     Neuro/Psych    GI/Hepatic   Endo/Other    Renal/GU      Musculoskeletal   Abdominal   Peds  Hematology   Anesthesia Other Findings   Reproductive/Obstetrics                             Anesthesia Physical Anesthesia Plan  ASA: II  Anesthesia Plan: General   Post-op Pain Management:    Induction: Intravenous  Airway Management Planned: Oral ETT  Additional Equipment:   Intra-op Plan:   Post-operative Plan: Extubation in OR  Informed Consent: I have reviewed the patients History and Physical, chart, labs and discussed the procedure including the risks, benefits and alternatives for the proposed anesthesia with the patient or authorized representative who has indicated his/her understanding and acceptance.   Dental Advisory Given and Dental advisory given  Plan Discussed with: CRNA and Surgeon  Anesthesia Plan Comments: (  Discussed general anesthesia, including possible nausea, instrumentation of airway, sore throat,pulmonary aspiration, etc. I asked if the were any outstanding questions, or  concerns before we proceeded.)        Anesthesia Quick Evaluation

## 2016-07-24 NOTE — Op Note (Signed)
Ruth [name] 07/24/2016  PREOPERATIVE DIAGNOSIS:  Undesired fertility  POSTOPERATIVE DIAGNOSIS:  Undesired fertility  PROCEDURE:  Laparoscopic Bilateral Tubal Sterilization using Filshie Clips   SURGEON: Adam PhenixJames G Nikhita Mentzel, MD   ANESTHESIA:  General endotracheal  COMPLICATIONS:  None immediate.  ESTIMATED BLOOD LOSS:  Less than 20 ml.  FLUIDS: 1000 ml LR.  URINE OUTPUT:  75 ml of clear urine.  INDICATIONS: 36 y.o. No obstetric history on file.  with undesired fertility, desires permanent sterilization. Other reversible forms of contraception were discussed with patient; she declines all other modalities.  Risks of procedure discussed with patient including permanence of method, bleeding, infection, injury to surrounding organs and need for additional procedures including laparotomy, risk of regret.  Failure risk of 0.5-1% with increased risk of ectopic gestation if pregnancy occurs was also discussed with patient.      FINDINGS:  Normal uterus, tubes, and ovaries.  TECHNIQUE:  The patient was taken to the operating room where general anesthesia was obtained without difficulty.  She was then placed in the dorsal lithotomy position and prepared and draped in sterile fashion.  After an adequate timeout was performed, a bivalved speculum was then placed in the patient's vagina, and the anterior lip of cervix grasped with the single-tooth tenaculum.  The uterine manipulator was then advanced into the uterus.  The speculum was removed from the vagina.  Attention was then turned to the patient's abdomen where a 11-mm skin incision was made in the umbilical fold.  The 11-mm trocar and sleeve were then advanced without difficulty with the laparoscope into the abdomen.  The abdomen was then insufflated with carbon dioxide gas and adequate pneumoperitoneum was obtained.  A survey of the patient's pelvis and abdomen revealed entirely normal anatomy.  The fallopian tubes were observed and found to be  normal in appearance. The Filshie clip applicator was placed through the operative port, and a Filshie clip was placed on the right fallopian tube ,about 2 cm from the cornual attachment, with care given to incorporate the underlying mesosalpinx.  A similar process was carried out on the contralateral side allowing for bilateral tubal sterilization.   Good hemostasis was noted overall.  The instruments were then removed from the patient's abdomen and the fascial incision was repaired with 0 Vicryl, and the skin was closed with 4-0 Vicryl.  The uterine manipulator and the tenaculum were removed from the vagina without complications. The patient tolerated the procedure well.  Sponge, lap, and needle counts were correct times two.  The patient was then taken to the recovery room awake, extubated and in stable condition.  Adam PhenixJames G Alissandra Geoffroy, MD 07/24/2016 8:58 AM

## 2016-07-25 ENCOUNTER — Encounter (HOSPITAL_COMMUNITY): Payer: Self-pay | Admitting: Obstetrics & Gynecology

## 2016-07-28 NOTE — Anesthesia Postprocedure Evaluation (Signed)
Anesthesia Post Note  Patient: Ruth [name]  Procedure(s) Performed: Procedure(s) (LRB): LAPAROSCOPIC TUBAL LIGATION (Bilateral)  Patient location during evaluation: PACU Anesthesia Type: General Level of consciousness: sedated Pain management: satisfactory to patient Vital Signs Assessment: post-procedure vital signs reviewed and stable Respiratory status: spontaneous breathing Cardiovascular status: stable Anesthetic complications: no        Last Vitals:  Vitals:   07/24/16 1058 07/24/16 1143  BP:  111/65  Pulse:  65  Resp:  20  Temp: 36.4 C     Last Pain:  Vitals:   07/24/16 1143  TempSrc:   PainSc: 4    Pain Goal: Patients Stated Pain Goal: 3 (07/24/16 0723)               Jiles GarterJACKSON,Jacquelene Kopecky EDWARD

## 2016-08-15 ENCOUNTER — Encounter: Payer: Self-pay | Admitting: *Deleted

## 2017-05-15 ENCOUNTER — Encounter (HOSPITAL_COMMUNITY): Payer: Self-pay | Admitting: Family Medicine

## 2017-05-15 ENCOUNTER — Ambulatory Visit (HOSPITAL_COMMUNITY)
Admission: EM | Admit: 2017-05-15 | Discharge: 2017-05-15 | Disposition: A | Discharge status: Home / Self Care | Payer: Self-pay | Attending: Family Medicine | Admitting: Family Medicine

## 2017-05-15 DIAGNOSIS — T7840XA Allergy, unspecified, initial encounter: Secondary | ICD-10-CM

## 2017-05-15 MED ORDER — PREDNISONE 10 MG (21) PO TBPK: ORAL_TABLET | Freq: Every day | ORAL | 0 refills | Status: DC

## 2017-05-15 NOTE — Discharge Instructions (Signed)
Please follow up if not seeing improvement within the next 24-48 hours. You may continue to use Benadryl.

## 2017-05-15 NOTE — ED Provider Notes (Signed)
  [facility]   [id number] 05/15/17 Arrival Time: 1016  ASSESSMENT & PLAN:  1. Allergic reaction, initial encounter     Meds ordered this encounter  Medications  . predniSONE (STERAPRED UNI-PAK 21 TAB) 10 MG (21) TBPK tablet    Sig: Take by mouth daily. Take as directed.    Dispense:  21 tablet    Refill:  0   May continue Benadryl in addition. Will f/u in 24-48 hours if not seeing improvement. Reviewed expectations re: course of current medical issues. Questions answered. Outlined signs and symptoms indicating need for more acute intervention. Patient verbalized understanding. After Visit Summary given.   SUBJECTIVE:  Ruth [name] is a 36 y.o. female who presents with complaint of a possible mosquito/insect bite or sting to her face near her L eye. Skin itching. Today noticed some swelling. Afebrile. No eye pain or visual changes. Benadryl with very slight help. No specific aggravating or alleviating factors reported. ROS: As per HPI.   OBJECTIVE:  Vitals:   05/15/17 1051  BP: 126/88  Pulse: 60  Resp: 18  Temp: 98.2 F (36.8 C)  SpO2: 100%    General appearance: alert; no distress Eyes: PERRLA; EOMI; conjunctiva normal HENT: normocephalic; atraumatic; TMs normal; nasal mucosa normal; oral mucosa normal Neck: supple Lungs: clear to auscultation bilaterally Heart: regular rate and rhythm Skin: warm and dry; very slight swelling around lateral L eye; no sign of skin infection Psychological: alert and cooperative; normal mood and affect   Allergies  Allergen Reactions  . Hydrocodone-Acetaminophen Nausea And Vomiting and Swelling    Past Medical History:  Diagnosis Date  . Asthma   . GERD (gastroesophageal reflux disease)   . Hypertension during pregnancy  . Pneumonia    2013   Social History   Social History  . Marital status: Single    Spouse name: N/A  . Number of children: N/A  . Years of education: N/A   Occupational History  . Not  on file.   Social History Main Topics  . Smoking status: Current Every Day Smoker    Packs/day: 0.20    Types: Cigarettes, Cigars  . Smokeless tobacco: Never Used  . Alcohol use No  . Drug use: No  . Sexual activity: Yes    Birth control/ protection: Other-see comments     Comment: depo shot   Other Topics Concern  . Not on file   Social History Narrative  . No narrative on file   History reviewed. No pertinent family history. Past Surgical History:  Procedure Laterality Date  . CHOLECYSTECTOMY  10/31/2011   Procedure: LAPAROSCOPIC CHOLECYSTECTOMY;  Surgeon: Liz MaladyBurke E Thompson, MD;  [facility];  Service: General;  Laterality: N/A;  . DILATION AND CURETTAGE OF UTERUS    . KNEE SURGERY Left 08/2014  . LAPAROSCOPIC TUBAL LIGATION Bilateral 07/24/2016   Procedure: LAPAROSCOPIC TUBAL LIGATION;  Surgeon: Adam PhenixJames G Arnold, MD;  Location: [facility];  Service: Gynecology;  Laterality: Bilateral;  . WISDOM TOOTH EXTRACTION       Mardella LaymanHagler, Selah Klang, MD 05/15/17 1109

## 2017-05-15 NOTE — ED Triage Notes (Signed)
Pt here for bug bite to left eye with itching.

## 2017-08-02 ENCOUNTER — Other Ambulatory Visit: Payer: Self-pay

## 2017-08-02 ENCOUNTER — Ambulatory Visit (HOSPITAL_COMMUNITY)
Admission: EM | Admit: 2017-08-02 | Discharge: 2017-08-02 | Disposition: A | Discharge status: Home / Self Care | Payer: Medicaid Other | Attending: Physician Assistant | Admitting: Physician Assistant

## 2017-08-02 ENCOUNTER — Encounter (HOSPITAL_COMMUNITY): Payer: Self-pay | Admitting: Emergency Medicine

## 2017-08-02 ENCOUNTER — Ambulatory Visit: Payer: Medicaid Other | Admitting: Family Medicine

## 2017-08-02 DIAGNOSIS — Z3202 Encounter for pregnancy test, result negative: Secondary | ICD-10-CM

## 2017-08-02 DIAGNOSIS — F10129 Alcohol abuse with intoxication, unspecified: Secondary | ICD-10-CM

## 2017-08-02 DIAGNOSIS — F10120 Alcohol abuse with intoxication, uncomplicated: Secondary | ICD-10-CM

## 2017-08-02 DIAGNOSIS — R112 Nausea with vomiting, unspecified: Secondary | ICD-10-CM

## 2017-08-02 LAB — POCT PREGNANCY, URINE: Preg Test, Ur: NEGATIVE

## 2017-08-02 LAB — POCT URINALYSIS DIP (DEVICE)
Bilirubin Urine: NEGATIVE
Glucose, UA: NEGATIVE mg/dL
HGB URINE DIPSTICK: NEGATIVE
Ketones, ur: NEGATIVE mg/dL
LEUKOCYTES UA: NEGATIVE
NITRITE: NEGATIVE
PH: 7 (ref 5.0–8.0)
PROTEIN: NEGATIVE mg/dL
Specific Gravity, Urine: 1.02 (ref 1.005–1.030)
UROBILINOGEN UA: 0.2 mg/dL (ref 0.0–1.0)

## 2017-08-02 MED ORDER — ONDANSETRON 4 MG PO TBDP
ORAL_TABLET | ORAL | Status: AC
  Filled 2017-08-02: qty 1

## 2017-08-02 MED ORDER — ONDANSETRON 4 MG PO TBDP
4.0000 mg | ORAL_TABLET | Freq: Once | ORAL | Status: AC
Start: 2017-08-02 — End: 2017-08-02
  Administered 2017-08-02: 4 mg via ORAL

## 2017-08-02 MED ORDER — IBUPROFEN 800 MG PO TABS
ORAL_TABLET | ORAL | Status: AC
  Filled 2017-08-02: qty 1

## 2017-08-02 MED ORDER — IBUPROFEN 800 MG PO TABS
800.0000 mg | ORAL_TABLET | Freq: Once | ORAL | Status: AC
Start: 2017-08-02 — End: 2017-08-02
  Administered 2017-08-02: 800 mg via ORAL

## 2017-08-02 MED ORDER — ONDANSETRON 4 MG PO TBDP: 4.0000 mg | ORAL_TABLET | Freq: Three times a day (TID) | ORAL | 0 refills | Status: DC | PRN

## 2017-08-02 NOTE — Discharge Instructions (Signed)
Drink plenty of fluids.  Zofran for nausea.  Follow up at [facility]

## 2017-08-02 NOTE — ED Provider Notes (Signed)
[facility]    CSN: [id number] Arrival date & time: 08/02/17  1631     History   Chief Complaint Chief Complaint  Patient presents with  . Emesis    HPI Ruth [name] is a 37 y.o. female.   The history is provided by the patient. No language interpreter was used.  Emesis  Severity:  Severe Duration:  1 day Timing:  Constant Number of daily episodes:  Multiple Progression:  Worsening Chronicity:  New Relieved by:  Nothing Worsened by:  Nothing Ineffective treatments:  None tried Associated symptoms: no chills   Risk factors: not pregnant   Pt reports she drank to much.  Pt reports vomiting today after drinking a bottle of EToh last might.   Past Medical History:  Diagnosis Date  . Asthma   . GERD (gastroesophageal reflux disease)   . Hypertension during pregnancy  . Pneumonia    2013    Patient Active Problem List   Diagnosis Date Noted  . 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 Health maintenance examination 03/13/2016  . Back pain 03/13/2016  . Left knee injury 06/22/2014  . Obesity (BMI 30-39.9) 06/25/2013  . Contact dermatitis 06/15/2013  . Cerumen impaction 08/22/2012  . Birth control 03/14/2012  . Constipation 11/09/2011  . Left knee pain 07/09/2011  . 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 ASTHMA, INTERMITTENT 02/19/2007    Past Surgical History:  Procedure Laterality Date  . CHOLECYSTECTOMY  10/31/2011   Procedure: LAPAROSCOPIC CHOLECYSTECTOMY;  Surgeon: Liz Malady, MD;  Location: [facility] OR;  Service: General;  Laterality: N/A;  . DILATION AND CURETTAGE OF UTERUS    . KNEE SURGERY Left 08/2014  . LAPAROSCOPIC TUBAL LIGATION Bilateral 07/24/2016   Procedure: LAPAROSCOPIC TUBAL LIGATION;  Surgeon: Adam Phenix, MD;  Location: [facility];  Service: Gynecology;  Laterality: Bilateral;  . WISDOM TOOTH EXTRACTION      OB History    No data available       Home Medications    Prior to Admission medications   Not on File     Family History History reviewed. No pertinent family history.  Social History Social History   Tobacco Use  . Smoking status: Current Every Day Smoker    Packs/day: 0.20    Types: Cigarettes, Cigars  . Smokeless tobacco: Never Used  Substance Use Topics  . Alcohol use: No  . Drug use: No     Allergies   Hydrocodone-acetaminophen   Review of Systems Review of Systems  Constitutional: Negative for chills.  Gastrointestinal: Positive for vomiting.  All other systems reviewed and are negative.    Physical Exam Triage Vital Signs ED Triage Vitals [08/02/17 1708]  Enc Vitals Group     BP 127/81     Pulse Rate 86     Resp 18     Temp 98.2 F (36.8 C)     Temp Source Oral     SpO2 99 %     Weight 210 lb (95.3 kg)     Height 5\' 6"  (1.676 m)     Head Circumference      Peak Flow      Pain Score 5     Pain Loc      Pain Edu?      Excl. in GC?    No data found.  Updated Vital Signs BP 127/81 (BP Location: Left Arm)   Pulse 86   Temp 98.2 F (36.8 C) (Oral)   Resp 18  Ht 5\' 6"  (1.676 m)   Wt 210 lb (95.3 kg)   LMP 07/02/2017   SpO2 99%   BMI 33.89 kg/m   Visual Acuity Right Eye Distance:   Left Eye Distance:   Bilateral Distance:    Right Eye Near:   Left Eye Near:    Bilateral Near:     Physical Exam  Constitutional: She appears well-developed and well-nourished. No distress.  HENT:  Head: Normocephalic and atraumatic.  Eyes: Conjunctivae are normal.  Neck: Neck supple.  Cardiovascular: Normal rate and regular rhythm.  No murmur heard. Pulmonary/Chest: Effort normal and breath sounds normal. No respiratory distress.  Abdominal: Soft.  Musculoskeletal: She exhibits no edema.  Neurological: She is alert.  Skin: Skin is warm and dry.  Psychiatric: She has a normal mood and affect.  Nursing note and vitals reviewed.    UC Treatments / Results  Labs (all labs ordered are listed, but only abnormal results are displayed) Labs Reviewed   POCT URINALYSIS DIP (DEVICE)  POCT PREGNANCY, URINE    EKG  EKG Interpretation None       Radiology No results found.  Procedures Procedures (including critical care time)  Medications Ordered in UC Medications  ondansetron (ZOFRAN-ODT) disintegrating tablet 4 mg (4 mg Oral Given 08/02/17 1806)  ibuprofen (ADVIL,MOTRIN) tablet 800 mg (800 mg Oral Given 08/02/17 1806)     Initial Impression / Assessment and Plan / UC Course  I have reviewed the triage vital signs and the nursing notes.  Pertinent labs & imaging results that were available during my care of the patient were reviewed by me and considered in my medical decision making (see chart for details).     ua no ketones normal specific gravity.  Urine pregnancy negative.  Pt given zofran odt and ibuprofen for bodyaches.  Pt given rx for zofran.  Pt encouraged to drink fluids.   Final Clinical Impressions(s) / UC Diagnoses   Final diagnoses:  Nausea and vomiting, intractability of vomiting not specified, unspecified vomiting type  Hangover effect, uncomplicated [facility](HCC)    ED Discharge Orders        Ordered    ondansetron (ZOFRAN ODT) 4 MG disintegrating tablet  Every 8 hours PRN     08/02/17 1852       Controlled Substance Prescriptions [place] Controlled Substance Registry consulted? Not Applicable  An After Visit Summary was printed and given to the patient.   Elson AreasSofia, Leslie K, [place] JerseyPA-C 08/02/17 [id number]

## 2017-08-02 NOTE — ED Triage Notes (Signed)
[facility] a [facility]/V and aa headache that started this am. The patient stated that "I think I drank too much alcohol."

## 2017-08-05 NOTE — Telephone Encounter (Signed)
Open encounter clean up.Rafiel Mecca R, CMA   

## 2017-09-26 ENCOUNTER — Other Ambulatory Visit (HOSPITAL_COMMUNITY)
Admission: RE | Admit: 2017-09-26 | Discharge: 2017-09-26 | Disposition: A | Discharge status: Home / Self Care | Payer: Medicaid Other | Source: Ambulatory Visit | Attending: Family Medicine | Admitting: Family Medicine

## 2017-09-26 ENCOUNTER — Other Ambulatory Visit: Payer: Self-pay

## 2017-09-26 ENCOUNTER — Encounter: Payer: Self-pay | Admitting: Family Medicine

## 2017-09-26 ENCOUNTER — Ambulatory Visit (INDEPENDENT_AMBULATORY_CARE_PROVIDER_SITE_OTHER): Payer: Self-pay | Admitting: Family Medicine

## 2017-09-26 VITALS — BP 128/84 | HR 77 | Temp 98.5°F | Ht 66.0 in | Wt 206.0 lb

## 2017-09-26 DIAGNOSIS — Z202 Contact with and (suspected) exposure to infections with a predominantly sexual mode of transmission: Secondary | ICD-10-CM | POA: Insufficient documentation

## 2017-09-26 DIAGNOSIS — Z118 Encounter for screening for other infectious and parasitic diseases: Secondary | ICD-10-CM

## 2017-09-26 DIAGNOSIS — Z114 Encounter for screening for human immunodeficiency virus [HIV]: Secondary | ICD-10-CM

## 2017-09-26 DIAGNOSIS — Z1159 Encounter for screening for other viral diseases: Secondary | ICD-10-CM

## 2017-09-26 LAB — POCT WET PREP (WET MOUNT)
Clue Cells Wet Prep Whiff POC: POSITIVE
Trichomonas Wet Prep HPF POC: ABSENT

## 2017-09-26 NOTE — Patient Instructions (Signed)
You were seen in clinic today for vaginal discharge.  We have tested you for infection and I will let you know once I have the results of these tests.  At that time, I will send in medication to your pharmacy if needed.  Please call clinic if you have any questions.  Be well, Freddrick MarchYashika Danton Palmateer MD

## 2017-09-26 NOTE — Progress Notes (Signed)
   Subjective:   Patient ID: Ruth [name]    DOB: May 18, 1981, 37 y.o. female   MRN: [id number]  CC: STD test, vaginal discharge  HPI: Ruth [name] is a 37 y.o. female who presents to clinic today for vaginal discharge and STD testing.  Vaginal discharge -Symptoms present x 2 days -Patient has noticed a slight odor and [place] discharge following her last menstrual period which ended 3 days ago. -No new sexual partners or known exposures -She has a history of chlamydia in 1999 which was treated -Sexually active currently with one female partner - Method of contraception: s/p tubal ligation 07/24/2016   ROS: No fever, chills, nausea, vomiting.  No abdominal pain.  No burning with urination, urinary urgency or frequency.  Social: Patient is a current everyday smoker Medications reviewed.  Objective:   BP 128/84   Pulse 77   Temp 98.5 F (36.9 C) (Oral)   Ht 5\' 6"  (1.676 m)   Wt 206 lb (93.4 kg)   SpO2 99%   BMI 33.25 kg/m  Vitals and nursing note reviewed.  General: 37 year old female, NAD CV: RRR no MRG Lungs: CTA B, normal effort Abdomen: Soft, NT ND, positive bowel sounds -Pelvic: VULVA: normal appearing vulva with no masses, tenderness or lesions, VAGINA: normal appearing vagina with normal color and discharge, no lesions, CERVIX: normal appearing cervix without discharge or lesions, UTERUS: uterus is normal size, shape, consistency and nontender, ADNEXA: normal adnexa in size, nontender and no masses. Skin: warm, dry, no rash Extremities: warm and well perfused, normal tone  Assessment & Plan:   Vaginal discharge No history of any sexual partners or known exposure.  Patient currently with symptoms of vaginal discharge, odor and itching x2 days duration.  Desires STD testing today. -Wet prep -GC chlamydia -RPR, HIV - Counseled for safe sex -Return precautions discussed  Orders Placed This Encounter  Procedures  . HIV antibody  . RPR  . POCT Wet Prep [facility]([place] [place])     Follow-up: If symptoms worsen or do not improve  Freddrick MarchYashika Kemisha Bonnette, MD [facility], PGY-2 09/26/2017 4:12 PM

## 2017-09-27 ENCOUNTER — Telehealth: Payer: Self-pay

## 2017-09-27 ENCOUNTER — Other Ambulatory Visit: Payer: Self-pay | Admitting: Family Medicine

## 2017-09-27 LAB — RPR: RPR: NONREACTIVE

## 2017-09-27 LAB — HIV ANTIBODY (ROUTINE TESTING W REFLEX): HIV Screen 4th Generation wRfx: NONREACTIVE

## 2017-09-27 MED ORDER — METRONIDAZOLE 500 MG PO TABS: 500.0000 mg | ORAL_TABLET | Freq: Two times a day (BID) | ORAL | 0 refills | Status: AC

## 2017-09-27 NOTE — Telephone Encounter (Signed)
Discussed pt labs with her and sent in Flagyl for BV.

## 2017-09-27 NOTE — Progress Notes (Signed)
Flagyl sent to pharmacy for BV 

## 2017-09-27 NOTE — Telephone Encounter (Signed)
Pt calling to speak with MD regarding lab work and yesterdays appointment.  Shawna OrleansMeredith B Thomsen, RN

## 2017-09-28 LAB — CERVICOVAGINAL ANCILLARY ONLY
Chlamydia: NEGATIVE
NEISSERIA GONORRHEA: NEGATIVE

## 2017-11-06 ENCOUNTER — Ambulatory Visit: Payer: Medicaid Other

## 2018-04-01 ENCOUNTER — Ambulatory Visit (HOSPITAL_COMMUNITY)
Admission: EM | Admit: 2018-04-01 | Discharge: 2018-04-01 | Disposition: A | Discharge status: Home / Self Care | Payer: Medicaid Other | Attending: Family Medicine | Admitting: Family Medicine

## 2018-04-01 ENCOUNTER — Encounter (HOSPITAL_COMMUNITY): Payer: Self-pay

## 2018-04-01 DIAGNOSIS — K219 Gastro-esophageal reflux disease without esophagitis: Secondary | ICD-10-CM | POA: Insufficient documentation

## 2018-04-01 DIAGNOSIS — N760 Acute vaginitis: Secondary | ICD-10-CM | POA: Insufficient documentation

## 2018-04-01 DIAGNOSIS — I10 Essential (primary) hypertension: Secondary | ICD-10-CM | POA: Insufficient documentation

## 2018-04-01 DIAGNOSIS — F17210 Nicotine dependence, cigarettes, uncomplicated: Secondary | ICD-10-CM | POA: Insufficient documentation

## 2018-04-01 DIAGNOSIS — Z6830 Body mass index (BMI) 30.0-30.9, adult: Secondary | ICD-10-CM | POA: Insufficient documentation

## 2018-04-01 DIAGNOSIS — Z885 Allergy status to narcotic agent status: Secondary | ICD-10-CM | POA: Insufficient documentation

## 2018-04-01 DIAGNOSIS — J4520 Mild intermittent asthma, uncomplicated: Secondary | ICD-10-CM | POA: Insufficient documentation

## 2018-04-01 DIAGNOSIS — L299 Pruritus, unspecified: Secondary | ICD-10-CM | POA: Insufficient documentation

## 2018-04-01 DIAGNOSIS — E669 Obesity, unspecified: Secondary | ICD-10-CM | POA: Insufficient documentation

## 2018-04-01 MED ORDER — FLUCONAZOLE 150 MG PO TABS: 150.0000 mg | ORAL_TABLET | Freq: Every day | ORAL | 0 refills | Status: DC

## 2018-04-01 NOTE — ED Triage Notes (Signed)
Pt presents with complaints of 2 days of vaginal odor, itching and having a scratch on her vagina.

## 2018-04-01 NOTE — Discharge Instructions (Addendum)
It was nice meeting you!!  We will go ahead and treat you today for yeast infection We are sending the swab for STD testing. We will let you know if any positive results. Follow up as needed for continued or worsening symptoms

## 2018-04-02 ENCOUNTER — Telehealth: Payer: Self-pay | Admitting: Family Medicine

## 2018-04-02 ENCOUNTER — Encounter (HOSPITAL_COMMUNITY): Payer: Self-pay | Admitting: Family Medicine

## 2018-04-02 LAB — CERVICOVAGINAL ANCILLARY ONLY
Bacterial vaginitis: POSITIVE — AB
Candida vaginitis: NEGATIVE
Chlamydia: NEGATIVE
NEISSERIA GONORRHEA: NEGATIVE
TRICH (WINDOWPATH): NEGATIVE

## 2018-04-02 NOTE — ED Provider Notes (Signed)
[facility]    CSN: [id number] Arrival date & time: 04/01/18  1043     History   Chief Complaint Chief Complaint  Patient presents with  . Appointment  . Vaginal Itching    HPI Ruth [name] is a 37 y.o. female.   Patient is a 37 year old female presents with vaginal itching, irritation, discharge x2 days.  Reports this started after sexual intercourse. Complaining of slight cut to labia that burns when she touches it or urinates. She denies any abdominal pain, back pain, hematuria, nausea, vomiting.  She denies any vaginal bleeding. She is currently sexually active with one partner,  unprotected.  History of tubal ligation.  Menstrual period was 03/16/2018.     Past Medical History:  Diagnosis Date  . Asthma   . GERD (gastroesophageal reflux disease)   . Hypertension during pregnancy  . Pneumonia    2013    Patient Active Problem List   Diagnosis Date Noted  . 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 Health maintenance examination 03/13/2016  . Back pain 03/13/2016  . Left knee injury 06/22/2014  . Obesity (BMI 30-39.9) 06/25/2013  . Contact dermatitis 06/15/2013  . Cerumen impaction 08/22/2012  . Birth control 03/14/2012  . Constipation 11/09/2011  . Left knee pain 07/09/2011  . 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 ASTHMA, INTERMITTENT 02/19/2007    Past Surgical History:  Procedure Laterality Date  . CHOLECYSTECTOMY  10/31/2011   Procedure: LAPAROSCOPIC CHOLECYSTECTOMY;  Surgeon: Liz Malady, MD;  Location: [facility] OR;  Service: General;  Laterality: N/A;  . DILATION AND CURETTAGE OF UTERUS    . KNEE SURGERY Left 08/2014  . LAPAROSCOPIC TUBAL LIGATION Bilateral 07/24/2016   Procedure: LAPAROSCOPIC TUBAL LIGATION;  Surgeon: Adam Phenix, MD;  Location: [facility];  Service: Gynecology;  Laterality: Bilateral;  . WISDOM TOOTH EXTRACTION      OB History   None      Home Medications    Prior to Admission medications   Medication Sig Start  Date End Date Taking? Authorizing Provider  fluconazole (DIFLUCAN) 150 MG tablet Take 1 tablet (150 mg total) by mouth daily. 04/01/18   Dahlia Byes A, NP  ondansetron (ZOFRAN ODT) 4 MG disintegrating tablet Take 1 tablet (4 mg total) by mouth every 8 (eight) hours as needed for nausea or vomiting. 08/02/17   Elson Areas, PA-C    Family History Family History  Problem Relation Age of Onset  . Drug abuse Mother   . Congestive Heart Failure Mother   . Cancer Father     Social History Social History   Tobacco Use  . Smoking status: Current Every Day Smoker    Packs/day: 0.20    Types: Cigarettes, Cigars  . Smokeless tobacco: Never Used  Substance Use Topics  . Alcohol use: No  . Drug use: No     Allergies   Hydrocodone-acetaminophen   Review of Systems Review of Systems   Physical Exam Triage Vital Signs ED Triage Vitals  Enc Vitals Group     BP 04/01/18 1104 120/81     Pulse Rate 04/01/18 1104 65     Resp 04/01/18 1104 18     Temp 04/01/18 1104 98.5 F (36.9 C)     Temp src --      SpO2 04/01/18 1104 99 %     Weight --      Height --      Head Circumference --      Peak Flow --  Pain Score 04/01/18 1101 2     Pain Loc --      Pain Edu? --      Excl. in GC? --    No data found.  Updated Vital Signs BP 120/81   Pulse 65   Temp 98.5 F (36.9 C)   Resp 18   LMP 03/16/2018   SpO2 99%   Visual Acuity Right Eye Distance:   Left Eye Distance:   Bilateral Distance:    Right Eye Near:   Left Eye Near:    Bilateral Near:     Physical Exam  Constitutional: She is oriented to person, place, and time. She appears well-developed and well-nourished.  Very pleasant. Non toxic or ill appearing.     HENT:  Head: Normocephalic and atraumatic.  Eyes: Conjunctivae are normal.  Neck: Normal range of motion.  Pulmonary/Chest: Effort normal.  Abdominal: Soft. She exhibits no distension and no mass. There is no tenderness. There is no rebound and no  guarding. No hernia.  Genitourinary:  Genitourinary Comments: Deferred, self swab obtained.   Musculoskeletal: Normal range of motion.  Neurological: She is alert and oriented to person, place, and time.  Skin: Skin is warm and dry.  Psychiatric: She has a normal mood and affect.  Nursing note and vitals reviewed.    UC Treatments / Results  Labs (all labs ordered are listed, but only abnormal results are displayed) Labs Reviewed  CERVICOVAGINAL ANCILLARY ONLY    EKG None  Radiology No results found.  Procedures Procedures (including critical care time)  Medications Ordered in UC Medications - No data to display  Initial Impression / Assessment and Plan / UC Course  I have reviewed the triage vital signs and the nursing notes.  Pertinent labs & imaging results that were available during my care of the patient were reviewed by me and considered in my medical decision making (see chart for details).     Vaginitis-  will go ahead and treat for yeast infection based on symptoms Self swab obtained for STD testing, bacteria, yeast.  Will call with any positive results Follow up as needed for continued or worsening symptoms  Final Clinical Impressions(s) / UC Diagnoses   Final diagnoses:  Vaginitis and vulvovaginitis     Discharge Instructions     It was nice meeting you!!  We will go ahead and treat you today for yeast infection We are sending the swab for STD testing. We will let you know if any positive results. Follow up as needed for continued or worsening symptoms     ED Prescriptions    Medication Sig Dispense Auth. Provider   fluconazole (DIFLUCAN) 150 MG tablet Take 1 tablet (150 mg total) by mouth daily. 2 tablet Dahlia Byes A, NP     Controlled Substance Prescriptions [place] Controlled Substance Registry consulted? Not Applicable   Janace Aris, NP 04/02/18 1538

## 2018-04-02 NOTE — Telephone Encounter (Signed)
Spoke with patient and informed her that the vaginal swab performed at the urgent care is not back yet.  She states that she is still having irritation and is unsure of why they prescribed medication for a yeast infection if they never did a pelvic exam and the results aren't back.  I advised patient that I am unsure of their process and why they only had her self swab but that without those results there isn't much to do for the symptoms.  Patient voiced understanding and will call back later today or tomorrow to check on the status of the results. Jazmin Hartsell,CMA

## 2018-04-02 NOTE — Telephone Encounter (Signed)
Patient is needing results from her vaginal swab because she does not want to take the medication prescribed until she has a diagnosis as to exactly what she needs.  [phone] is the best number to reach her today.  She will come back if she needs to be seen again.

## 2018-04-03 ENCOUNTER — Telehealth (HOSPITAL_COMMUNITY): Payer: Self-pay

## 2018-04-03 MED ORDER — METRONIDAZOLE 500 MG PO TABS: 500.0000 mg | ORAL_TABLET | Freq: Two times a day (BID) | ORAL | 0 refills | Status: DC

## 2018-04-03 NOTE — Telephone Encounter (Signed)
Bacterial vaginosis is positive. This was not treated at the urgent care visit.  Patient complains of persistent symptoms.  Flagyl 500 mg BID x 7 days #14 no refills sent to patients pharmacy of choice per Dr. Murray.  Pt called and made aware of results and new prescription. Answered all questions and pt verbalized understanding.  

## 2018-04-21 ENCOUNTER — Ambulatory Visit (HOSPITAL_COMMUNITY)
Admission: EM | Admit: 2018-04-21 | Discharge: 2018-04-21 | Disposition: A | Discharge status: Home / Self Care | Payer: Medicaid Other | Attending: Family Medicine | Admitting: Family Medicine

## 2018-04-21 ENCOUNTER — Encounter (HOSPITAL_COMMUNITY): Payer: Self-pay | Admitting: Emergency Medicine

## 2018-04-21 DIAGNOSIS — Z79899 Other long term (current) drug therapy: Secondary | ICD-10-CM | POA: Insufficient documentation

## 2018-04-21 DIAGNOSIS — N760 Acute vaginitis: Secondary | ICD-10-CM

## 2018-04-21 DIAGNOSIS — F17210 Nicotine dependence, cigarettes, uncomplicated: Secondary | ICD-10-CM | POA: Insufficient documentation

## 2018-04-21 DIAGNOSIS — Z885 Allergy status to narcotic agent status: Secondary | ICD-10-CM | POA: Insufficient documentation

## 2018-04-21 MED ORDER — FLUCONAZOLE 150 MG PO TABS: 150.0000 mg | ORAL_TABLET | Freq: Every day | ORAL | 0 refills | Status: DC

## 2018-04-21 MED ORDER — CLINDAMYCIN HCL 150 MG PO CAPS: 150.0000 mg | ORAL_CAPSULE | Freq: Four times a day (QID) | ORAL | 0 refills | Status: DC

## 2018-04-21 MED ORDER — NYSTATIN 100000 UNIT/GM EX CREA: TOPICAL_CREAM | CUTANEOUS | 0 refills | Status: DC

## 2018-04-21 NOTE — Discharge Instructions (Signed)
It was nice meeting you!!  We will try a different medication for BV to see if this helps. Diflucan again for yeast.  Nystatin cream for itching, irritation and discomfort.  Sending the swab again for testing.  Follow up as needed for continued or worsening symptoms

## 2018-04-21 NOTE — ED Provider Notes (Signed)
[facility]    CSN: [id number] Arrival date & time: 04/21/18  8295     History   Chief Complaint Chief Complaint  Patient presents with  . Vaginal Discharge    HPI Alexxus Sobh is a 37 y.o. female.   Patient is a 37 year old female who presents with continued vaginal irritation, itching, burning, discoloration.  She was seen here a few weeks ago and treated for yeast and BV.  She took all medication without any relief of symptoms.  She denies any discharge or odor.  She has been also using Monistat cream for relief.  The Monistat makes her symptoms worse.  She denies any abdominal pain, back pain, pelvic pain, dysuria, hematuria, vaginal bleeding.  Her last menstrual period was 04/13/2018.  She is currently sexually active in a monogamous relationship.   ROS per HPI    Vaginal Discharge    Past Medical History:  Diagnosis Date  . Asthma   . GERD (gastroesophageal reflux disease)   . Hypertension during pregnancy  . Pneumonia    2013    Patient Active Problem List   Diagnosis Date Noted  . 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 Health maintenance examination 03/13/2016  . Back pain 03/13/2016  . Left knee injury 06/22/2014  . Obesity (BMI 30-39.9) 06/25/2013  . Contact dermatitis 06/15/2013  . Cerumen impaction 08/22/2012  . Birth control 03/14/2012  . Constipation 11/09/2011  . Left knee pain 07/09/2011  . 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 ASTHMA, INTERMITTENT 02/19/2007    Past Surgical History:  Procedure Laterality Date  . CHOLECYSTECTOMY  10/31/2011   Procedure: LAPAROSCOPIC CHOLECYSTECTOMY;  Surgeon: Liz Malady, MD;  Location: [facility] OR;  Service: General;  Laterality: N/A;  . DILATION AND CURETTAGE OF UTERUS    . KNEE SURGERY Left 08/2014  . LAPAROSCOPIC TUBAL LIGATION Bilateral 07/24/2016   Procedure: LAPAROSCOPIC TUBAL LIGATION;  Surgeon: Adam Phenix, MD;  Location: [facility];  Service: Gynecology;  Laterality: Bilateral;  . WISDOM  TOOTH EXTRACTION      OB History   None      Home Medications    Prior to Admission medications   Medication Sig Start Date End Date Taking? Authorizing Provider  metroNIDAZOLE (FLAGYL) 500 MG tablet Take 1 tablet (500 mg total) by mouth 2 (two) times daily. 04/03/18  Yes Eustace Moore, MD  clindamycin (CLEOCIN) 150 MG capsule Take 1 capsule (150 mg total) by mouth every 6 (six) hours. 04/21/18   Dahlia Byes A, NP  fluconazole (DIFLUCAN) 150 MG tablet Take 1 tablet (150 mg total) by mouth daily. 04/21/18   Dahlia Byes A, NP  nystatin cream (MYCOSTATIN) Apply to affected area 2 times daily 04/21/18   Dahlia Byes A, NP  ondansetron (ZOFRAN ODT) 4 MG disintegrating tablet Take 1 tablet (4 mg total) by mouth every 8 (eight) hours as needed for nausea or vomiting. 08/02/17   Elson Areas, PA-C    Family History Family History  Problem Relation Age of Onset  . Drug abuse Mother   . Congestive Heart Failure Mother   . Cancer Father     Social History Social History   Tobacco Use  . Smoking status: Current Every Day Smoker    Packs/day: 0.20    Types: Cigarettes, Cigars  . Smokeless tobacco: Never Used  Substance Use Topics  . Alcohol use: No  . Drug use: No     Allergies   Hydrocodone-acetaminophen   Review of Systems Review  of Systems  Genitourinary: Positive for vaginal discharge.     Physical Exam Triage Vital Signs ED Triage Vitals [04/21/18 1001]  Enc Vitals Group     BP 128/86     Pulse Rate 79     Resp 16     Temp 98.8 F (37.1 C)     Temp Source Temporal     SpO2 100 %     Weight      Height      Head Circumference      Peak Flow      Pain Score 9     Pain Loc      Pain Edu?      Excl. in GC?    No data found.  Updated Vital Signs BP 128/86   Pulse 79   Temp 98.8 F (37.1 C) (Temporal)   Resp 16   LMP 04/13/2018   SpO2 100%   Visual Acuity Right Eye Distance:   Left Eye Distance:   Bilateral Distance:    Right Eye Near:     Left Eye Near:    Bilateral Near:     Physical Exam  Constitutional: She is oriented to person, place, and time. She appears well-developed and well-nourished.  Very pleasant. Non toxic or ill appearing.     HENT:  Head: Normocephalic and atraumatic.  Eyes: Conjunctivae are normal.  Neck: Normal range of motion.  Pulmonary/Chest: Effort normal.  Abdominal: Soft. Bowel sounds are normal.  Abdomen soft, non tender. No CVA tenderness. No rebound tenderness.     Genitourinary:  Genitourinary Comments: Mild erythema and swelling to labia majora. No lesions.  Erythema to labia minora and vaginal opening.  A mix of thick and thin white discharge. Moderate amount.  Cervix normal. No CMT.   Musculoskeletal: Normal range of motion.  Neurological: She is alert and oriented to person, place, and time.  Skin: Skin is warm and dry.  Psychiatric: She has a normal mood and affect.  Nursing note and vitals reviewed.    UC Treatments / Results  Labs (all labs ordered are listed, but only abnormal results are displayed) Labs Reviewed  CERVICOVAGINAL ANCILLARY ONLY    EKG None  Radiology No results found.  Procedures Procedures (including critical care time)  Medications Ordered in UC Medications - No data to display  Initial Impression / Assessment and Plan / UC Course  I have reviewed the triage vital signs and the nursing notes.  Pertinent labs & imaging results that were available during my care of the patient were reviewed by me and considered in my medical decision making (see chart for details).     We will go ahead and treat for bacterial vaginosis and yeast based on findings of pelvic exam. Sending swab for culture Lab results pending Final Clinical Impressions(s) / UC Diagnoses   Final diagnoses:  Acute vaginitis     Discharge Instructions     It was nice meeting you!!  We will try a different medication for BV to see if this helps. Diflucan again for  yeast.  Nystatin cream for itching, irritation and discomfort.  Sending the swab again for testing.  Follow up as needed for continued or worsening symptoms     ED Prescriptions    Medication Sig Dispense Auth. Provider   clindamycin (CLEOCIN) 150 MG capsule Take 1 capsule (150 mg total) by mouth every 6 (six) hours. 28 capsule Coni Homesley A, NP   fluconazole (DIFLUCAN) 150 MG tablet Take 1  tablet (150 mg total) by mouth daily. 2 tablet Abdur Hoglund A, NP   nystatin cream (MYCOSTATIN) Apply to affected area 2 times daily 30 g Dahlia Byes A, NP     Controlled Substance Prescriptions [place] Controlled Substance Registry consulted? Not Applicable   Janace Aris, NP 04/21/18 1105

## 2018-04-21 NOTE — ED Triage Notes (Signed)
Pt reports vaginal itching, discharge, pain for over 3 weeks. Was treated 2.5 weeks ago without relief.

## 2018-04-22 LAB — CERVICOVAGINAL ANCILLARY ONLY
BACTERIAL VAGINITIS: NEGATIVE
Candida vaginitis: POSITIVE — AB
Chlamydia: NEGATIVE
NEISSERIA GONORRHEA: NEGATIVE
Trichomonas: NEGATIVE

## 2018-04-23 ENCOUNTER — Telehealth: Payer: Self-pay

## 2018-04-23 NOTE — Telephone Encounter (Signed)
Pt called nurse line in regards to her urgent care visit. Pt has been prescribed flagyl and clindamycin and diflucan over the last couple of weeks for BV and yeast. Pt is only on the second dose of her clindamycin and is complaining of sxs still. I explained to pt give the medicine some time to work. If you are still have uncomfortable sxs give [place] a call and we can put you in for an access to care apt.

## 2018-05-22 ENCOUNTER — Encounter: Payer: Medicaid Other | Admitting: Family Medicine

## 2018-05-30 ENCOUNTER — Encounter: Payer: Self-pay | Admitting: Family Medicine

## 2018-05-30 ENCOUNTER — Other Ambulatory Visit: Payer: Self-pay

## 2018-05-30 ENCOUNTER — Ambulatory Visit (INDEPENDENT_AMBULATORY_CARE_PROVIDER_SITE_OTHER): Payer: Medicaid Other | Admitting: Family Medicine

## 2018-05-30 ENCOUNTER — Other Ambulatory Visit (HOSPITAL_COMMUNITY)
Admission: RE | Admit: 2018-05-30 | Discharge: 2018-05-30 | Disposition: A | Discharge status: Home / Self Care | Payer: Medicaid Other | Source: Ambulatory Visit | Attending: Family Medicine | Admitting: Family Medicine

## 2018-05-30 VITALS — BP 116/70 | HR 96 | Temp 98.7°F | Wt 193.0 lb

## 2018-05-30 DIAGNOSIS — Z0000 Encounter for general adult medical examination without abnormal findings: Secondary | ICD-10-CM

## 2018-05-30 DIAGNOSIS — Z124 Encounter for screening for malignant neoplasm of cervix: Secondary | ICD-10-CM

## 2018-05-30 DIAGNOSIS — N898 Other specified noninflammatory disorders of vagina: Secondary | ICD-10-CM

## 2018-05-30 LAB — POCT WET PREP (WET MOUNT)
CLUE CELLS WET PREP WHIFF POC: POSITIVE
Trichomonas Wet Prep HPF POC: ABSENT

## 2018-05-30 MED ORDER — FLUCONAZOLE 150 MG PO TABS: 150.0000 mg | ORAL_TABLET | Freq: Once | ORAL | 0 refills | Status: AC

## 2018-05-30 MED ORDER — METRONIDAZOLE 500 MG PO TABS: 500.0000 mg | ORAL_TABLET | Freq: Two times a day (BID) | ORAL | 0 refills | Status: AC

## 2018-05-30 NOTE — Progress Notes (Signed)
   Subjective:    Patient ID: Ruth Ruth [name], female    DOB: 1980/09/29, 37 y.o.   MRN: [id number]  Date of Visit: 05/30/2018   HPI:  Patient presents today for a well woman exam.   Concerns today: cough for 1-2 days Periods: regular Contraception: tubal ligation Pelvic symptoms: some vaginal odor and discharge, clear/ thin, uses protection with partner of 5 months and has had BV previously. No itching, bleeding or abdominal pain Sexual activity: yes with 1 female partner STD Screening: yes Pap smear status: will perform today Exercise: not regularly Diet: eats varied diet Smoking: 1-2 cig/week Alcohol: socially, 1 glass of wine per month Drugs: marijuana  Advance directives: none Mood: pt has no concern at this time Dentist: yes  ROS: See HPI  PMFSH:  Cancers in family: none  PHYSICAL EXAM: BP 116/70   Pulse 96   Temp 98.7 F (37.1 C) (Oral)   Wt 193 lb (87.5 kg)   LMP 05/11/2018   SpO2 99%   BMI 31.15 kg/m  Gen: NAD, pleasant, cooperative HEENT: NCAT, PERRL, no palpable thyromegaly or anterior cervical lymphadenopathy Heart: RRR, no murmurs Lungs: CTAB, NWOB Abdomen: soft, nontender to palpation Neuro: grossly nonfocal, speech normal GU: normal appearing external genitalia without lesions. Vagina is moist with white discharge. Cervix normal in appearance. No cervical motion tenderness or tenderness on bimanual exam. No adnexal masses.   ASSESSMENT/PLAN:  # Health maintenance:  -STD screening: GC/C recent neg HIV, RPR -pap smear: will perform today -mammogram: not due -lipid screening: not indicated -DEXA: not indicated -immunizations: refused flu today -advance directives: not discussed -handout given on health maintenance topics  FOLLOW UP: Follow up in 1 year or sooner as needed.  [facility]  Ruth Trinitee Horgan, DO Family Medicine Resident, PGY-2   URI: Exam consistent with URI. No fevers, chills, rigors, sore throat concerning for  influenza like illness. Overall pt is well appearing, well hydrated, without respiratory distress. Discussed symptomatic treatment - continue to monitor for fevers  - continue Tylenol/ Motrin as needed for discomfort - nasal saline to help with his nasal congestion - Use a cool mist humidifier at bedtime to help with breathing - Stressed hydration - Ruth [name] for cough - Discussed return precautions, understanding voiced

## 2018-05-30 NOTE — Patient Instructions (Addendum)
Thank you for coming to see me today. It was a pleasure! Today we talked about:   You have a cold and it should start to get better about 7 - 10 days after it started.    For your cough, try honey scheduled, may use in tea.   For your nasal congestion and runny nose, try using Afrin (generic is Oxymetazoline) twice daily for 3 days.  Do not use for longer that 3 days.   You may also try guaifenesin to help with the congestion.    Some other therapies you can try are: push fluids, rest, encourage very strongly to quit smoking and return office visit prn if symptoms persist or worsen.   Drinking warm liquids such as teas and soups can help with secretions and cough. A mist humidifier or vaporizer can work well to help with secretions and cough.  It is very important to clean the humidifier between use according to the instructions.    It was good to see you.  If you're still having trouble in the next week, come back and see Ruth [name].    Of course, if you start having trouble breathing, worsening fevers, vomiting and unable to hold down any fluids, or you have other concerns, don't hesitate to come back or go to the ED after hours.   We will release your results on mychart.   Please follow-up with me as needed.  If you have any questions or concerns, please do not hesitate to call the office at [phone].  Take Care,   [place] Chamia Schmutz, DO   Bacterial Vaginosis Bacterial vaginosis is an infection of the vagina. It happens when too many germs (bacteria) grow in the vagina. This infection puts you at risk for infections from sex (STIs). Treating this infection can lower your risk for some STIs. You should also treat this if you are pregnant. It can cause your baby to be born early. Follow these instructions at home: Medicines  Take over-the-counter and prescription medicines only as told by your doctor.  Take or use your antibiotic medicine as told by your doctor. Do not stop taking or  using it even if you start to feel better. General instructions  If you your sexual partner is a woman, tell her that you have this infection. She needs to get treatment if she has symptoms. If you have a female partner, he does not need to be treated.  During treatment: ? Avoid sex. ? Do not douche. ? Avoid alcohol as told. ? Avoid breastfeeding as told.  Drink enough fluid to keep your pee (urine) clear or pale yellow.  Keep your vagina and butt (rectum) clean. ? Wash the area with warm water every day. ? Wipe from front to back after you use the toilet.  Keep all follow-up visits as told by your doctor. This is important. Preventing this condition  Do not douche.  Use only warm water to wash around your vagina.  Use protection when you have sex. This includes: ? Latex condoms. ? Dental dams.  Limit how many people you have sex with. It is best to only have sex with the same person (be monogamous).  Get tested for STIs. Have your partner get tested.  Wear underwear that is cotton or lined with cotton.  Avoid tight pants and pantyhose. This is most important in summer.  Do not use any products that have nicotine or tobacco in them. These include cigarettes and e-cigarettes. If you need help  quitting, ask your doctor.  Do not use illegal drugs.  Limit how much alcohol you drink. Contact a doctor if:  Your symptoms do not get better, even after you are treated.  You have more discharge or pain when you pee (urinate).  You have a fever.  You have pain in your belly (abdomen).  You have pain with sex.  Your bleed from your vagina between periods. Summary  This infection happens when too many germs (bacteria) grow in the vagina.  Treating this condition can lower your risk for some infections from sex (STIs).  You should also treat this if you are pregnant. It can cause early (premature) birth.  Do not stop taking or using your antibiotic medicine even if you  start to feel better. This information is not intended to replace advice given to you by your health care provider. Make sure you discuss any questions you have with your health care provider. Document Released: 04/17/2008 Document Revised: 03/24/2016 Document Reviewed: 03/24/2016 Elsevier Interactive Patient Education  2017 [place] Maintenance, Female Adopting a healthy lifestyle and getting preventive care can go a long way to promote health and wellness. Talk with your health care provider about what schedule of regular examinations is right for you. This is a good chance for you to check in with your provider about disease prevention and staying healthy. In between checkups, there are plenty of things you can do on your own. Experts have done a lot of research about which lifestyle changes and preventive measures are most likely to keep you healthy. Ask your health care provider for more information. Weight and diet Eat a healthy diet  Be sure to include plenty of vegetables, fruits, low-fat dairy products, and lean protein.  Do not eat a lot of foods high in solid fats, added sugars, or salt.  Get regular exercise. This is one of the most important things you can do for your health. ? Most adults should exercise for at least 150 minutes each week. The exercise should increase your heart rate and make you sweat (moderate-intensity exercise). ? Most adults should also do strengthening exercises at least twice a week. This is in addition to the moderate-intensity exercise.  Maintain a healthy weight  Body mass index (BMI) is a measurement that can be used to identify possible weight problems. It estimates body fat based on height and weight. Your health care provider can help determine your BMI and help you achieve or maintain a healthy weight.  For females 44 years of age and older: ? A BMI below 18.5 is considered underweight. ? A BMI of 18.5 to 24.9 is normal. ? A BMI  of 25 to 29.9 is considered overweight. ? A BMI of 30 and above is considered obese.  Watch levels of cholesterol and blood lipids  You should start having your blood tested for lipids and cholesterol at 38 years of age, then have this test every 5 years.  You may need to have your cholesterol levels checked more often if: ? Your lipid or cholesterol levels are high. ? You are older than 38 years of age. ? You are at high risk for heart disease.  Cancer screening Lung Cancer  Lung cancer screening is recommended for adults 80-56 years old who are at high risk for lung cancer because of a history of smoking.  A yearly low-dose CT scan of the lungs is recommended for people who: ? Currently smoke. ? Have quit within  the past 15 years. ? Have at least a 30-pack-year history of smoking. A pack year is smoking an average of one pack of cigarettes a day for 1 year.  Yearly screening should continue until it has been 15 years since you quit.  Yearly screening should stop if you develop a health problem that would prevent you from having lung cancer treatment.  Breast Cancer  Practice breast self-awareness. This means understanding how your breasts normally appear and feel.  It also means doing regular breast self-exams. Let your health care provider know about any changes, no matter how small.  If you are in your 20s or 30s, you should have a clinical breast exam (CBE) by a health care provider every 1-3 years as part of a regular health exam.  If you are 47 or older, have a CBE every year. Also consider having a breast X-ray (mammogram) every year.  If you have a family history of breast cancer, talk to your health care provider about genetic screening.  If you are at high risk for breast cancer, talk to your health care provider about having an MRI and a mammogram every year.  Breast cancer gene (BRCA) assessment is recommended for women who have family members with BRCA-related  cancers. BRCA-related cancers include: ? Breast. ? Ovarian. ? Tubal. ? Peritoneal cancers.  Results of the assessment will determine the need for genetic counseling and BRCA1 and BRCA2 testing.  Cervical Cancer Your health care provider may recommend that you be screened regularly for cancer of the pelvic organs (ovaries, uterus, and vagina). This screening involves a pelvic examination, including checking for microscopic changes to the surface of your cervix (Pap test). You may be encouraged to have this screening done every 3 years, beginning at age 43.  For women ages 67-65, health care providers may recommend pelvic exams and Pap testing every 3 years, or they may recommend the Pap and pelvic exam, combined with testing for human papilloma virus (HPV), every 5 years. Some types of HPV increase your risk of cervical cancer. Testing for HPV may also be done on women of any age with unclear Pap test results.  Other health care providers may not recommend any screening for nonpregnant women who are considered low risk for pelvic cancer and who do not have symptoms. Ask your health care provider if a screening pelvic exam is right for you.  If you have had past treatment for cervical cancer or a condition that could lead to cancer, you need Pap tests and screening for cancer for at least 20 years after your treatment. If Pap tests have been discontinued, your risk factors (such as having a new sexual partner) need to be reassessed to determine if screening should resume. Some women have medical problems that increase the chance of getting cervical cancer. In these cases, your health care provider may recommend more frequent screening and Pap tests.  Colorectal Cancer  This type of cancer can be detected and often prevented.  Routine colorectal cancer screening usually begins at 38 years of age and continues through 38 years of age.  Your health care provider may recommend screening at an  earlier age if you have risk factors for colon cancer.  Your health care provider may also recommend using home test kits to check for hidden blood in the stool.  A small camera at the end of a tube can be used to examine your colon directly (sigmoidoscopy or colonoscopy). This is done to check for  the earliest forms of colorectal cancer.  Routine screening usually begins at age 89.  Direct examination of the colon should be repeated every 5-10 years through 38 years of age. However, you may need to be screened more often if early forms of precancerous polyps or small growths are found.  Skin Cancer  Check your skin from head to toe regularly.  Tell your health care provider about any new moles or changes in moles, especially if there is a change in a mole's shape or color.  Also tell your health care provider if you have a mole that is larger than the size of a pencil eraser.  Always use sunscreen. Apply sunscreen liberally and repeatedly throughout the day.  Protect yourself by wearing long sleeves, pants, a wide-brimmed hat, and sunglasses whenever you are outside.  Heart disease, diabetes, and high blood pressure  High blood pressure causes heart disease and increases the risk of stroke. High blood pressure is more likely to develop in: ? People who have blood pressure in the high end of the normal range (130-139/85-89 mm Hg). ? People who are overweight or obese. ? People who are African American.  If you are 79-59 years of age, have your blood pressure checked every 3-5 years. If you are 33 years of age or older, have your blood pressure checked every year. You should have your blood pressure measured twice-once when you are at a hospital or clinic, and once when you are not at a hospital or clinic. Record the average of the two measurements. To check your blood pressure when you are not at a hospital or clinic, you can use: ? An automated blood pressure machine at a  pharmacy. ? A home blood pressure monitor.  If you are between 66 years and 54 years old, ask your health care provider if you should take aspirin to prevent strokes.  Have regular diabetes screenings. This involves taking a blood sample to check your fasting blood sugar level. ? If you are at a normal weight and have a low risk for diabetes, have this test once every three years after 38 years of age. ? If you are overweight and have a high risk for diabetes, consider being tested at a younger age or more often. Preventing infection Hepatitis B  If you have a higher risk for hepatitis B, you should be screened for this virus. You are considered at high risk for hepatitis B if: ? You were born in a country where hepatitis B is common. Ask your health care provider which countries are considered high risk. ? Your parents were born in a high-risk country, and you have not been immunized against hepatitis B (hepatitis B vaccine). ? You have HIV or AIDS. ? You use needles to inject street drugs. ? You live with someone who has hepatitis B. ? You have had sex with someone who has hepatitis B. ? You get hemodialysis treatment. ? You take certain medicines for conditions, including cancer, organ transplantation, and autoimmune conditions.  Hepatitis C  Blood testing is recommended for: ? Everyone born from [birth year] through 1966. ? Anyone with known risk factors for hepatitis C.  Sexually transmitted infections (STIs)  You should be screened for sexually transmitted infections (STIs) including gonorrhea and chlamydia if: ? You are sexually active and are younger than 38 years of age. ? You are older than 38 years of age and your health care provider tells you that you are at risk for this type  of infection. ? Your sexual activity has changed since you were last screened and you are at an increased risk for chlamydia or gonorrhea. Ask your health care provider if you are at risk.  If you do not  have HIV, but are at risk, it may be recommended that you take a prescription medicine daily to prevent HIV infection. This is called pre-exposure prophylaxis (PrEP). You are considered at risk if: ? You are sexually active and do not regularly use condoms or know the HIV status of your partner(s). ? You take drugs by injection. ? You are sexually active with a partner who has HIV.  Talk with your health care provider about whether you are at high risk of being infected with HIV. If you choose to begin PrEP, you should first be tested for HIV. You should then be tested every 3 months for as long as you are taking PrEP. Pregnancy  If you are premenopausal and you may become pregnant, ask your health care provider about preconception counseling.  If you may become pregnant, take 400 to 800 micrograms (mcg) of folic acid every day.  If you want to prevent pregnancy, talk to your health care provider about birth control (contraception). Osteoporosis and menopause  Osteoporosis is a disease in which the bones lose minerals and strength with aging. This can result in serious bone fractures. Your risk for osteoporosis can be identified using a bone density scan.  If you are 53 years of age or older, or if you are at risk for osteoporosis and fractures, ask your health care provider if you should be screened.  Ask your health care provider whether you should take a calcium or vitamin D supplement to lower your risk for osteoporosis.  Menopause may have certain physical symptoms and risks.  Hormone replacement therapy may reduce some of these symptoms and risks. Talk to your health care provider about whether hormone replacement therapy is right for you. Follow these instructions at home:  Schedule regular health, dental, and eye exams.  Stay current with your immunizations.  Do not use any tobacco products including cigarettes, chewing tobacco, or electronic cigarettes.  If you are pregnant,  do not drink alcohol.  If you are breastfeeding, limit how much and how often you drink alcohol.  Limit alcohol intake to no more than 1 drink per day for nonpregnant women. One drink equals 12 ounces of beer, 5 ounces of wine, or 1 ounces of hard liquor.  Do not use street drugs.  Do not share needles.  Ask your health care provider for help if you need support or information about quitting drugs.  Tell your health care provider if you often feel depressed.  Tell your health care provider if you have ever been abused or do not feel safe at home. This information is not intended to replace advice given to you by your health care provider. Make sure you discuss any questions you have with your health care provider. Document Released: 01/22/2011 Document Revised: 12/15/2015 Document Reviewed: 04/12/2015 Elsevier Interactive Patient Education  2018 [employer]. 1

## 2018-06-02 LAB — CERVICOVAGINAL ANCILLARY ONLY
Chlamydia: NEGATIVE
Neisseria Gonorrhea: NEGATIVE

## 2018-06-04 LAB — CYTOLOGY - PAP
Adequacy: ABSENT
Diagnosis: NEGATIVE
HPV (WINDOPATH): NOT DETECTED

## 2018-08-21 ENCOUNTER — Other Ambulatory Visit (HOSPITAL_COMMUNITY)
Admission: RE | Admit: 2018-08-21 | Discharge: 2018-08-21 | Disposition: A | Discharge status: Home / Self Care | Payer: Medicaid Other | Source: Ambulatory Visit | Attending: Family Medicine | Admitting: Family Medicine

## 2018-08-21 ENCOUNTER — Ambulatory Visit (INDEPENDENT_AMBULATORY_CARE_PROVIDER_SITE_OTHER): Payer: Medicaid Other | Admitting: Family Medicine

## 2018-08-21 ENCOUNTER — Other Ambulatory Visit: Payer: Self-pay

## 2018-08-21 VITALS — BP 112/64 | HR 86 | Temp 98.6°F | Wt 193.4 lb

## 2018-08-21 DIAGNOSIS — Z202 Contact with and (suspected) exposure to infections with a predominantly sexual mode of transmission: Secondary | ICD-10-CM | POA: Insufficient documentation

## 2018-08-21 LAB — POCT WET PREP (WET MOUNT)
CLUE CELLS WET PREP WHIFF POC: POSITIVE
Trichomonas Wet Prep HPF POC: ABSENT

## 2018-08-21 MED ORDER — METRONIDAZOLE 0.75 % VA GEL: 1.0000 | Freq: Two times a day (BID) | VAGINAL | 6 refills | Status: DC

## 2018-08-21 MED ORDER — FLUCONAZOLE 150 MG PO TABS: 150.0000 mg | ORAL_TABLET | Freq: Once | ORAL | 0 refills | Status: AC

## 2018-08-21 NOTE — Progress Notes (Signed)
   CC: vaginal odor   HPI  Vaginal odor - for 3 days, feels similar to previous BV. Tends to get yeast after flagyl. No fever or dysuria. Feels like this partner is causing frequent trouble with BV. No abd pain or dysuria.   ROS: Denies CP, SOB, abdominal pain, dysuria, changes in BMs.   CC, SH/smoking status, and VS noted  Objective: BP 112/64   Pulse 86   Temp 98.6 F (37 C) (Oral)   Wt 193 lb 6 oz (87.7 kg)   SpO2 99%   BMI 31.21 kg/m  Gen: NAD, alert, cooperative, and pleasant. HEENT: NCAT, EOMI, PERRL CV: RRR, no murmur Resp: CTAB, no wheezes, non-labored Ext: No edema, warm Neuro: Alert and oriented, Speech clear, No gross deficits  Assessment and plan:  Bacterial vaginosis - recurrent infection today from self swab. Will send G/c. We will treat for recurrent infection with 5d BID metrogel followed by twice weekly metrogel for 12 weeks.   Orders Placed This Encounter  Procedures  . POCT Wet Prep [facility])    No orders of the defined types were placed in this encounter.   Loni Muse, MD, PGY3 08/21/2018 4:10 PM

## 2018-08-25 LAB — CERVICOVAGINAL ANCILLARY ONLY
Chlamydia: NEGATIVE
Neisseria Gonorrhea: NEGATIVE

## 2019-01-15 ENCOUNTER — Telehealth: Payer: Self-pay

## 2019-01-15 MED ORDER — FLUCONAZOLE 150 MG PO TABS: 150.0000 mg | ORAL_TABLET | Freq: Once | ORAL | 0 refills | Status: AC

## 2019-01-15 NOTE — Telephone Encounter (Signed)
Can send in diflucan x1, but do not see where patient is on any antibiotics. If she continues to have issues, she will need to be evaluated in the office.

## 2019-01-15 NOTE — Telephone Encounter (Signed)
Pt calling to have Dr call in [place] for yeast infection from taking antibiotic. Ottis Stain, CMA

## 2019-03-11 ENCOUNTER — Other Ambulatory Visit (HOSPITAL_COMMUNITY)
Admission: RE | Admit: 2019-03-11 | Discharge: 2019-03-11 | Disposition: A | Discharge status: Home / Self Care | Payer: Medicaid Other | Source: Ambulatory Visit | Attending: Family Medicine | Admitting: Family Medicine

## 2019-03-11 ENCOUNTER — Other Ambulatory Visit: Payer: Self-pay

## 2019-03-11 ENCOUNTER — Ambulatory Visit (INDEPENDENT_AMBULATORY_CARE_PROVIDER_SITE_OTHER): Payer: Self-pay | Admitting: Family Medicine

## 2019-03-11 VITALS — BP 124/70 | HR 51

## 2019-03-11 DIAGNOSIS — M545 Low back pain, unspecified: Secondary | ICD-10-CM

## 2019-03-11 DIAGNOSIS — N898 Other specified noninflammatory disorders of vagina: Secondary | ICD-10-CM | POA: Insufficient documentation

## 2019-03-11 LAB — POCT URINALYSIS DIP (MANUAL ENTRY)
Bilirubin, UA: NEGATIVE
Blood, UA: NEGATIVE
Glucose, UA: NEGATIVE mg/dL
Ketones, POC UA: NEGATIVE mg/dL
Leukocytes, UA: NEGATIVE
Nitrite, UA: NEGATIVE
Protein Ur, POC: NEGATIVE mg/dL
Spec Grav, UA: 1.02 (ref 1.010–1.025)
Urobilinogen, UA: 0.2 E.U./dL
pH, UA: 7.5 (ref 5.0–8.0)

## 2019-03-11 LAB — POCT WET PREP (WET MOUNT)
Clue Cells Wet Prep Whiff POC: NEGATIVE
Trichomonas Wet Prep HPF POC: ABSENT

## 2019-03-11 NOTE — Patient Instructions (Addendum)
I will call you when I get the results of the tests.  This will determine which medication use.  You may have to do a longer course of therapy if it is bacterial vaginosis again.  If you start develop fevers or signs of systemic infection such as nausea or vomiting, headache please make another appointment with [place] so that we can evaluate you.  Have a great day,  Clemetine Marker, MD   Bacterial Vaginosis  Bacterial vaginosis is a vaginal infection that occurs when the normal balance of bacteria in the vagina is disrupted. It results from an overgrowth of certain bacteria. This is the most common vaginal infection among women ages 34-44. Because bacterial vaginosis increases your risk for STIs (sexually transmitted infections), getting treated can help reduce your risk for chlamydia, gonorrhea, herpes, and HIV (human immunodeficiency virus). Treatment is also important for preventing complications in pregnant women, because this condition can cause an early (premature) delivery. What are the causes? This condition is caused by an increase in harmful bacteria that are normally present in small amounts in the vagina. However, the reason that the condition develops is not fully understood. What increases the risk? The following factors may make you more likely to develop this condition:  Having a new sexual partner or multiple sexual partners.  Having unprotected sex.  Douching.  Having an intrauterine device (IUD).  Smoking.  Drug and alcohol abuse.  Taking certain antibiotic medicines.  Being pregnant. You cannot get bacterial vaginosis from toilet seats, bedding, swimming pools, or contact with objects around you. What are the signs or symptoms? Symptoms of this condition include:  Grey or white vaginal discharge. The discharge can also be watery or foamy.  A fish-like odor with discharge, especially after sexual intercourse or during menstruation.  Itching in and around the  vagina.  Burning or pain with urination. Some women with bacterial vaginosis have no signs or symptoms. How is this diagnosed? This condition is diagnosed based on:  Your medical history.  A physical exam of the vagina.  Testing a sample of vaginal fluid under a microscope to look for a large amount of bad bacteria or abnormal cells. Your health care provider may use a cotton swab or a small wooden spatula to collect the sample. How is this treated? This condition is treated with antibiotics. These may be given as a pill, a vaginal cream, or a medicine that is put into the vagina (suppository). If the condition comes back after treatment, a second round of antibiotics may be needed. Follow these instructions at home: Medicines  Take over-the-counter and prescription medicines only as told by your health care provider.  Take or use your antibiotic as told by your health care provider. Do not stop taking or using the antibiotic even if you start to feel better. General instructions  If you have a female sexual partner, tell her that you have a vaginal infection. She should see her health care provider and be treated if she has symptoms. If you have a female sexual partner, he does not need treatment.  During treatment: ? Avoid sexual activity until you finish treatment. ? Do not douche. ? Avoid alcohol as directed by your health care provider. ? Avoid breastfeeding as directed by your health care provider.  Drink enough water and fluids to keep your urine clear or pale yellow.  Keep the area around your vagina and rectum clean. ? Wash the area daily with warm water. ? Wipe yourself from front  to back after using the toilet.  Keep all follow-up visits as told by your health care provider. This is important. How is this prevented?  Do not douche.  Wash the outside of your vagina with warm water only.  Use protection when having sex. This includes latex condoms and dental  dams.  Limit how many sexual partners you have. To help prevent bacterial vaginosis, it is best to have sex with just one partner (monogamous).  Make sure you and your sexual partner are tested for STIs.  Wear cotton or cotton-lined underwear.  Avoid wearing tight pants and pantyhose, especially during summer.  Limit the amount of alcohol that you drink.  Do not use any products that contain nicotine or tobacco, such as cigarettes and e-cigarettes. If you need help quitting, ask your health care provider.  Do not use illegal drugs. Where to find more information  Centers for Disease Control and Prevention: SolutionApps.co.zawww.cdc.gov/std  American Sexual Health Association (ASHA): www.ashastd.org  U.S. Department of Health and Health and safety inspectorHuman Services, Office on Women's Health: ConventionalMedicines.siwww.womenshealth.gov/ or http://www.anderson-williamson.info/https://www.womenshealth.gov/a-z-topics/bacterial-vaginosis Contact a health care provider if:  Your symptoms do not improve, even after treatment.  You have more discharge or pain when urinating.  You have a fever.  You have pain in your abdomen.  You have pain during sex.  You have vaginal bleeding between periods. Summary  Bacterial vaginosis is a vaginal infection that occurs when the normal balance of bacteria in the vagina is disrupted.  Because bacterial vaginosis increases your risk for STIs (sexually transmitted infections), getting treated can help reduce your risk for chlamydia, gonorrhea, herpes, and HIV (human immunodeficiency virus). Treatment is also important for preventing complications in pregnant women, because the condition can cause an early (premature) delivery.  This condition is treated with antibiotic medicines. These may be given as a pill, a vaginal cream, or a medicine that is put into the vagina (suppository). This information is not intended to replace advice given to you by your health care provider. Make sure you discuss any questions you have with your health care  provider. Document Released: 07/09/2005 Document Revised: 06/21/2017 Document Reviewed: 03/24/2016 Elsevier Patient Education  2020 [employer]

## 2019-03-11 NOTE — Progress Notes (Signed)
   [facility] Phone: [phone]     Ruth [name] - 38 y.o. female MRN [id number]  Date of birth: Jan 23, 1981  Subjective:   cc: Vaginal odor/discharge  HPI:  Odor/discharge -symptoms started a month ago.  She use gel metronidazole and took Diflucan.  The symptoms improved, then recurred after her menstrual cycle ended.  She has been with the same partner for 2 years.  She denies vaginal pain or pruritus.  She denies dysuria, although endorsed increased urinary frequency over the past 2 days.  She denies fever, nausea, vomiting.  She is complaining of a "stiffness" at the top of her right gluteal region.  Patient does endorse heavy periods that last for 7 to 10 days with a 2-day period of cessation at approximately day 5 and 6.  She states this is been going on for a long time.     ROS: See HPI for pertinent positives and negatives  Past Medical History  Family history reviewed for today's visit. No changes.  Social history- patient is a   smoker  Health Maintenance:  -  Health Maintenance Due  Topic Date Due  . INFLUENZA VACCINE  02/21/2019    Objective:   BP 124/70   Pulse (!) 51   SpO2 99%  Gen: NAD, alert and oriented, cooperative with exam GI: Nontender to palpation. GU: No vulvovaginal lesions.  No ulcers or lesions in the vaginal vault or cervix.  Moderate amount of whitish discharge.  Psych: Appropriate behavior  Assessment/Plan:   Vaginal discharge Recurrent episodes of vaginal discharge/odor.  Has been treated for BV in the past with metronidazole oral and gel. - Wet prep and GC/CT, along with urinalysis. -We will treat accordingly pending results.  May need longer course or alternative treatment given recurrence of symptoms.   Orders Placed This Encounter  Procedures  . POCT urinalysis dipstick  . POCT Wet Prep [facility])   Clemetine Marker, MD PGY-2 [facility]

## 2019-03-12 ENCOUNTER — Telehealth: Payer: Self-pay | Admitting: Family Medicine

## 2019-03-12 ENCOUNTER — Other Ambulatory Visit: Payer: Self-pay | Admitting: Family Medicine

## 2019-03-12 DIAGNOSIS — N898 Other specified noninflammatory disorders of vagina: Secondary | ICD-10-CM | POA: Insufficient documentation

## 2019-03-12 LAB — CERVICOVAGINAL ANCILLARY ONLY
Chlamydia: NEGATIVE
Neisseria Gonorrhea: NEGATIVE

## 2019-03-12 MED ORDER — CLINDAMYCIN HCL 300 MG PO CAPS: 300.0000 mg | ORAL_CAPSULE | Freq: Two times a day (BID) | ORAL | 0 refills | Status: DC

## 2019-03-12 NOTE — Assessment & Plan Note (Signed)
Recurrent episodes of vaginal discharge/odor.  Has been treated for BV in the past with metronidazole oral and gel. - Wet prep and GC/CT, along with urinalysis. -We will treat accordingly pending results.  May need longer course or alternative treatment given recurrence of symptoms.

## 2019-03-12 NOTE — Telephone Encounter (Signed)
Called and informed pt of her results.

## 2019-05-10 ENCOUNTER — Emergency Department (HOSPITAL_COMMUNITY)
Admission: EM | Admit: 2019-05-10 | Discharge: 2019-05-11 | Disposition: A | Discharge status: Home / Self Care | Payer: Medicaid Other | Attending: Emergency Medicine | Admitting: Emergency Medicine

## 2019-05-10 ENCOUNTER — Encounter (HOSPITAL_COMMUNITY): Payer: Self-pay | Admitting: Emergency Medicine

## 2019-05-10 ENCOUNTER — Other Ambulatory Visit: Payer: Self-pay

## 2019-05-10 DIAGNOSIS — B349 Viral infection, unspecified: Secondary | ICD-10-CM | POA: Insufficient documentation

## 2019-05-10 DIAGNOSIS — F1210 Cannabis abuse, uncomplicated: Secondary | ICD-10-CM | POA: Insufficient documentation

## 2019-05-10 DIAGNOSIS — F17210 Nicotine dependence, cigarettes, uncomplicated: Secondary | ICD-10-CM | POA: Insufficient documentation

## 2019-05-10 NOTE — ED Triage Notes (Signed)
Pt reports body aches and headache, along with runny nose and loss of appetite.

## 2019-05-11 NOTE — ED Provider Notes (Signed)
[facility]-EMERGENCY DEPT Provider Note   CSN: [id number] Arrival date & time: 05/10/19  2147     History   Chief Complaint Chief Complaint  Patient presents with  . Generalized Body Aches    HPI Ruth [name] is a 38 y.o. female.      URI Presenting symptoms: congestion, fatigue, rhinorrhea and sore throat   Severity:  Mild Onset quality:  Gradual Duration:  2 days Timing:  Constant Chronicity:  New Relieved by:  None tried Worsened by:  Nothing Ineffective treatments:  None tried Associated symptoms: arthralgias and myalgias   Associated symptoms: no headaches, no neck pain, no sinus pain, no sneezing and no wheezing   Risk factors: sick contacts     Past Medical History:  Diagnosis Date  . Asthma   . GERD (gastroesophageal reflux disease)   . Hypertension during pregnancy  . Pneumonia    2013    Patient Active Problem List   Diagnosis Date Noted  . Vaginal discharge 03/12/2019  . 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 Health maintenance examination 03/13/2016  . Back pain 03/13/2016  . Left knee injury 06/22/2014  . Obesity (BMI 30-39.9) 06/25/2013  . Contact dermatitis 06/15/2013  . Cerumen impaction 08/22/2012  . Birth control 03/14/2012  . Constipation 11/09/2011  . Left knee pain 07/09/2011  . 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 ASTHMA, INTERMITTENT 02/19/2007    Past Surgical History:  Procedure Laterality Date  . CHOLECYSTECTOMY  10/31/2011   Procedure: LAPAROSCOPIC CHOLECYSTECTOMY;  Surgeon: Liz Malady, MD;  Location: [facility] OR;  Service: General;  Laterality: N/A;  . DILATION AND CURETTAGE OF UTERUS    . KNEE SURGERY Left 08/2014  . LAPAROSCOPIC TUBAL LIGATION Bilateral 07/24/2016   Procedure: LAPAROSCOPIC TUBAL LIGATION;  Surgeon: Adam Phenix, MD;  Location: [facility] ORS;  Service: Gynecology;  Laterality: Bilateral;  . WISDOM TOOTH EXTRACTION       OB History   No obstetric history on file.    Obstetric  Comments  Have 4 children, vaginal deliveries          Home Medications    Prior to Admission medications   Medication Sig Start Date End Date Taking? Authorizing Provider  metroNIDAZOLE (METROGEL) 0.75 % vaginal gel Place 1 Applicatorful vaginally 2 (two) times daily. Twice daily for 5 days, then twice per week for 12 weeks. 08/21/18   Shon Hale, MD    Family History Family History  Problem Relation Age of Onset  . Drug abuse Mother   . Congestive Heart Failure Mother   . Cancer Father     Social History Social History   Tobacco Use  . Smoking status: Current Every Day Smoker    Packs/day: 0.20    Types: Cigarettes, Cigars  . Smokeless tobacco: Never Used  Substance Use Topics  . Alcohol use: Yes    Alcohol/week: 1.0 standard drinks    Types: 1 Glasses of wine per week    Comment: socially   . Drug use: Yes    Types: Marijuana    Comment: occassionally      Allergies   Hydrocodone-acetaminophen   Review of Systems Review of Systems  Constitutional: Positive for fatigue.  HENT: Positive for congestion, rhinorrhea and sore throat. Negative for sinus pain and sneezing.   Respiratory: Negative for wheezing.   Musculoskeletal: Positive for arthralgias and myalgias. Negative for neck pain.  Neurological: Negative for headaches.  All other systems reviewed and are negative.  Physical Exam Updated Vital Signs BP 130/79 (BP Location: Right Arm)   Pulse 68   Temp 99 F (37.2 C) (Oral)   Resp 17   Ht 5\' 6"  (1.676 m)   Wt 87.5 kg   SpO2 100%   BMI 31.15 kg/m   Physical Exam Vitals signs and nursing note reviewed.  Constitutional:      Appearance: She is well-developed.  HENT:     Head: Normocephalic and atraumatic.     Nose: Congestion present.  Eyes:     Conjunctiva/sclera: Conjunctivae normal.  Neck:     Musculoskeletal: Normal range of motion.  Cardiovascular:     Rate and Rhythm: Normal rate and regular rhythm.  Pulmonary:      Effort: No respiratory distress.     Breath sounds: No stridor.  Abdominal:     General: There is no distension.  Musculoskeletal: Normal range of motion.        General: No swelling or tenderness.  Skin:    General: Skin is warm and dry.  Neurological:     General: No focal deficit present.     Mental Status: She is alert.      ED Treatments / Results  Labs (all labs ordered are listed, but only abnormal results are displayed) Labs Reviewed - No data to display  EKG None  Radiology No results found.  Procedures Procedures (including critical care time)  Medications Ordered in ED Medications - No data to display   Initial Impression / Assessment and Plan / ED Course  I have reviewed the triage vital signs and the nursing notes.  Pertinent labs & imaging results that were available during my care of the patient were reviewed by me and considered in my medical decision making (see chart for details).        Developing URI of unspecified etiology. Doubt bacterial. Suggested some otc symptomatic treatments.   Final Clinical Impressions(s) / ED Diagnoses   Final diagnoses:  Viral illness    ED Discharge Orders    None       Zayden Hahne, Corene Cornea, MD 05/11/19 0130

## 2019-09-15 ENCOUNTER — Telehealth: Payer: Self-pay | Admitting: *Deleted

## 2019-09-15 NOTE — Progress Notes (Signed)
    SUBJECTIVE:   CHIEF COMPLAINT / HPI:   Persistent malodorous vaginal discharge Ruth [name] has been seen in the clinic multiple times in the past several years for malodorous vaginal discharge.  In that time, she has had left findings consistent with bacterial vaginosis.  Today, she is interested in talking about trimethylamine.  Based on her Internet reading, she is found that high levels of trimethylamine are responsible for the fishy odor of bacterial vaginosis.  She is concerned that she has this persistent vaginal malodor in addition to occasional halitosis with a fishy odor and would like to have a blood test for trimethyl amine performed.   She reports that she has on and off vaginal discharge and is not currently experiencing any significant vaginal discharge.  Birth control  Previous bilateral tubal ligation.  Currently sexually active.  No concern for STIs at this time.  Health maintenance Pap smear not due until 05/2023.  No clear family history of early onset cancers or heart disease.  OBJECTIVE:   BP 124/68   Pulse (!) 120   Ht 5\' 6"  (1.676 m)   Wt 194 lb (88 kg)   LMP 09/02/2019 (Approximate)   SpO2 99%   BMI 31.31 kg/m    General: Alert and cooperative and appears to be in no acute distress Cardio: Normal S1 and S2, no S3 or S4. Rhythm is regular. No murmurs or rubs.   Pulm: Clear to auscultation bilaterally, no crackles, wheezing, or diminished breath sounds. Normal respiratory effort Abdomen: Abdomen soft and non-tender.  Extremities: No peripheral edema. Warm/ well perfused.  Strong radial pulse   ASSESSMENT/PLAN:   Vaginal discharge Trimethylamine testing discussed with Dr. 10/31/2019.  There is testing is not routinely done in our clinic.  Discussed with Ms. Fouty that this is not a common test for this clinic and not the first necessary test to address recurrent bacterial vaginosis or recurrent vaginal discharge.  No significant discharge at this time we will  not move forward with samples at this visit. -Follow-up A1c for factors contributing to recurrent vaginal discharge/BV -Ambulatory referral to gynecology based on patient preference for further discussion regarding trimethylamine  Screening for hyperlipidemia -Follow-up lipid panel     Manson Passey, MD [facility]

## 2019-09-15 NOTE — Telephone Encounter (Signed)
LVM to call office to go over screening questions prior to visit.Fabrizio Filip Zimmerman Rumple, CMA  

## 2019-09-16 ENCOUNTER — Other Ambulatory Visit: Payer: Self-pay

## 2019-09-16 ENCOUNTER — Encounter: Payer: Self-pay | Admitting: Family Medicine

## 2019-09-16 ENCOUNTER — Ambulatory Visit (INDEPENDENT_AMBULATORY_CARE_PROVIDER_SITE_OTHER): Payer: Self-pay | Admitting: Family Medicine

## 2019-09-16 VITALS — BP 124/68 | HR 120 | Ht 66.0 in | Wt 194.0 lb

## 2019-09-16 DIAGNOSIS — N898 Other specified noninflammatory disorders of vagina: Secondary | ICD-10-CM

## 2019-09-16 DIAGNOSIS — Z13220 Encounter for screening for lipoid disorders: Secondary | ICD-10-CM | POA: Insufficient documentation

## 2019-09-16 DIAGNOSIS — Z8742 Personal history of other diseases of the female genital tract: Secondary | ICD-10-CM

## 2019-09-16 DIAGNOSIS — Z131 Encounter for screening for diabetes mellitus: Secondary | ICD-10-CM

## 2019-09-16 NOTE — Assessment & Plan Note (Signed)
Trimethylamine testing discussed with Dr. Lum Babe.  There is testing is not routinely done in our clinic.  Discussed with Ms. Salomone that this is not a common test for this clinic and not the first necessary test to address recurrent bacterial vaginosis or recurrent vaginal discharge.  No significant discharge at this time we will not move forward with samples at this visit. -Follow-up A1c for factors contributing to recurrent vaginal discharge/BV -Ambulatory referral to gynecology based on patient preference for further discussion regarding trimethylamine

## 2019-09-16 NOTE — Assessment & Plan Note (Signed)
Follow-up lipid panel 

## 2019-09-16 NOTE — Patient Instructions (Signed)
Regarding your blood work, I will send a mychart message if it is normal or call you if it is abnormal.    For your questions about recurrent BV, I spoke with my supervising physician and she was not familiar with this test.  If you really interested in that specific test, I can refer you to gynecology but I don't think that it is indicated at this time.  You are otherwise up-to-date on your cancer screening.

## 2019-09-17 LAB — LIPID PANEL
Chol/HDL Ratio: 2.9 ratio (ref 0.0–4.4)
Cholesterol, Total: 199 mg/dL (ref 100–199)
HDL: 68 mg/dL (ref >39)
LDL Chol Calc (NIH): 114 mg/dL — ABNORMAL HIGH (ref 0–99)
Triglycerides: 97 mg/dL (ref 0–149)
VLDL Cholesterol Cal: 17 mg/dL (ref 5–40)

## 2019-09-17 LAB — HEMOGLOBIN A1C
Est. average glucose Bld gHb Est-mCnc: 105 mg/dL
Hgb A1c MFr Bld: 5.3 % (ref 4.8–5.6)

## 2019-12-25 ENCOUNTER — Encounter: Payer: Self-pay | Admitting: Family Medicine

## 2019-12-25 ENCOUNTER — Other Ambulatory Visit (HOSPITAL_COMMUNITY)
Admission: RE | Admit: 2019-12-25 | Discharge: 2019-12-25 | Disposition: A | Discharge status: Home / Self Care | Payer: Medicaid Other | Source: Ambulatory Visit | Attending: Family Medicine | Admitting: Family Medicine

## 2019-12-25 ENCOUNTER — Other Ambulatory Visit: Payer: Self-pay

## 2019-12-25 ENCOUNTER — Ambulatory Visit (INDEPENDENT_AMBULATORY_CARE_PROVIDER_SITE_OTHER): Payer: Self-pay | Admitting: Family Medicine

## 2019-12-25 ENCOUNTER — Encounter (HOSPITAL_COMMUNITY): Payer: Self-pay | Admitting: Emergency Medicine

## 2019-12-25 ENCOUNTER — Ambulatory Visit
Admission: RE | Admit: 2019-12-25 | Discharge: 2019-12-25 | Disposition: A | Discharge status: Home / Self Care | Payer: Medicaid Other | Source: Ambulatory Visit | Attending: Family Medicine | Admitting: Family Medicine

## 2019-12-25 ENCOUNTER — Emergency Department (HOSPITAL_COMMUNITY)
Admission: EM | Admit: 2019-12-25 | Discharge: 2019-12-26 | Discharge status: Against Medical Advice | Payer: Medicaid Other | Attending: Emergency Medicine | Admitting: Emergency Medicine

## 2019-12-25 VITALS — BP 114/82 | Ht 66.0 in | Wt 188.0 lb

## 2019-12-25 DIAGNOSIS — Z5321 Procedure and treatment not carried out due to patient leaving prior to being seen by health care provider: Secondary | ICD-10-CM | POA: Insufficient documentation

## 2019-12-25 DIAGNOSIS — N898 Other specified noninflammatory disorders of vagina: Secondary | ICD-10-CM | POA: Insufficient documentation

## 2019-12-25 DIAGNOSIS — R7611 Nonspecific reaction to tuberculin skin test without active tuberculosis: Secondary | ICD-10-CM

## 2019-12-25 DIAGNOSIS — N939 Abnormal uterine and vaginal bleeding, unspecified: Secondary | ICD-10-CM | POA: Insufficient documentation

## 2019-12-25 DIAGNOSIS — Z8611 Personal history of tuberculosis: Secondary | ICD-10-CM

## 2019-12-25 DIAGNOSIS — Z113 Encounter for screening for infections with a predominantly sexual mode of transmission: Secondary | ICD-10-CM

## 2019-12-25 DIAGNOSIS — R1031 Right lower quadrant pain: Secondary | ICD-10-CM | POA: Insufficient documentation

## 2019-12-25 LAB — COMPREHENSIVE METABOLIC PANEL WITH GFR
ALT: 18 U/L (ref 0–44)
AST: 20 U/L (ref 15–41)
Albumin: 3.8 g/dL (ref 3.5–5.0)
Alkaline Phosphatase: 50 U/L (ref 38–126)
Anion gap: 8 (ref 5–15)
BUN: 8 mg/dL (ref 6–20)
CO2: 26 mmol/L (ref 22–32)
Calcium: 9 mg/dL (ref 8.9–10.3)
Chloride: 105 mmol/L (ref 98–111)
Creatinine, Ser: 0.83 mg/dL (ref 0.44–1.00)
GFR calc Af Amer: >60 mL/min
GFR calc non Af Amer: >60 mL/min
Glucose, Bld: 96 mg/dL (ref 70–99)
Potassium: 4.1 mmol/L (ref 3.5–5.1)
Sodium: 139 mmol/L (ref 135–145)
Total Bilirubin: 0.4 mg/dL (ref 0.3–1.2)
Total Protein: 6.5 g/dL (ref 6.5–8.1)

## 2019-12-25 LAB — CBC
HCT: 43.8 % (ref 36.0–46.0)
Hemoglobin: 13.8 g/dL (ref 12.0–15.0)
MCH: 27.9 pg (ref 26.0–34.0)
MCHC: 31.5 g/dL (ref 30.0–36.0)
MCV: 88.7 fL (ref 80.0–100.0)
Platelets: 324 10*3/uL (ref 150–400)
RBC: 4.94 MIL/uL (ref 3.87–5.11)
RDW: 13.6 % (ref 11.5–15.5)
WBC: 5.9 10*3/uL (ref 4.0–10.5)
nRBC: 0 % (ref 0.0–0.2)

## 2019-12-25 LAB — POCT WET PREP (WET MOUNT)
Clue Cells Wet Prep Whiff POC: POSITIVE
Trichomonas Wet Prep HPF POC: ABSENT

## 2019-12-25 LAB — I-STAT BETA HCG BLOOD, ED (MC, WL, AP ONLY): I-stat hCG, quantitative: <5 m[IU]/mL

## 2019-12-25 LAB — LIPASE, BLOOD: Lipase: 29 U/L (ref 11–51)

## 2019-12-25 MED ORDER — FLUCONAZOLE 150 MG PO TABS: 150.0000 mg | ORAL_TABLET | Freq: Once | ORAL | 0 refills | Status: AC

## 2019-12-25 MED ORDER — METRONIDAZOLE 500 MG PO TABS: 500.0000 mg | ORAL_TABLET | Freq: Two times a day (BID) | ORAL | 0 refills | Status: AC

## 2019-12-25 MED ORDER — SODIUM CHLORIDE 0.9% FLUSH: 3.0000 mL | Freq: Once | INTRAVENOUS | Status: DC

## 2019-12-25 NOTE — Progress Notes (Signed)
   SUBJECTIVE:   CHIEF COMPLAINT / HPI:   Vaginal odor - has been ongoing for 2-3 days  - Denies itching, burning, abdominal pain, nausea or vomiting. Denies burning with urination, hematuria.  - Sexully active with one female partner. Had STI >20 years ago. - LMP was on 12/10/19, and was normal for patient.  - Patient reports BV, yeast in the past.   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 Asymptomatic.   Abnormal uterine bleeding Patient reports that her past few cycles have been variant in length.  She previously had cycles regularly every 7 days but had recently been having them 7 to 10 days.  She also reports that she has had significant cramping with her periods.  Patient reports has a family history of fibroids and would like to have an ultrasound in order to determine if that is what is happening as her pain is quite significant in her right lower quadrant during her periods.  She states that 3 days ago her pain did improve.  She has been using ibuprofen for the pain which has helped.  She denies any nausea, vomiting, diarrhea or constipation.  She denies any blood in her stool.  She denies any fevers or chills.  She has never had any pain like this happen before.  PERTINENT  PMH / PSH: as above  OBJECTIVE:  BP 114/82   Ht 5\' 6"  (1.676 m)   Wt 188 lb (85.3 kg)   LMP 12/10/2019 (Exact Date)   BMI 30.34 kg/m   General: NAD, pleasant Neck: Supple Respiratory: normal work of breathing Psych: AOx3, appropriate affect GU/GYN: External genitalia within normal limits.  Vaginal mucosa pink, moist, normal rugae.  Nonfriable cervix without lesions, no discharge or bleeding noted on speculum exam. Exam performed in the presence of a chaperone.   ASSESSMENT/PLAN:   Vaginal discharge BV confirmed on wet prep. G/C 12/12/2019 is pending. Symptoms consistent with this.  - Treatment with Flagyl 500 BID x 7 days. Diflucan x1 after treatment. - F/U if  symptoms not improving or getting worse.  - Will f/u on G/C Chlamydia and call in Rx if positive.  - Self care instructions given including avoiding douching. Handout given.  - F/U with PCP as needed.  - Return precautions including abdominal pain, fever, chills, nausea, or vomiting given.    Abnormal uterine bleeding (AUB) Patient with significant cramping during cycles as well as irregular bleeding for the past few months.  Patient reports family history of fibroids.  Will obtain transvaginal and pelvic ultrasound in order to determine etiology.  Encourage patient to continue ibuprofen has helped her with her cramping.  If bleeding or cramping continues then patient to return for clinic for other options and may need to discuss endometrial biopsy based on ultrasound findings and symptoms.  Patient may also need to consider OCPs if she does not currently desire pregnancy in order to help control her symptoms.  Positive PPD Screening for TB obtained today with DG CXR.    Ruth Locus Thao Vanover, DO PGY-3, [place] [facility]

## 2019-12-25 NOTE — Patient Instructions (Signed)
Thank you for coming to see me today. It was a pleasure! Today we talked about:   We will call you with your lab results if anything is abnormal.  Otherwise we will release them on MyChart.  I have scheduled you a pelvic ultrasound for your irregular bleeding and pain.  During your periods you may take ibuprofen scheduled in order to help with the pain and bleeding.  Please follow-up with our clinic if your bleeding/pain continues or sooner as needed.  If you have any questions or concerns, please do not hesitate to call the office at [phone].  Take Care,   [place] Fidencio Duddy, DO

## 2019-12-25 NOTE — Assessment & Plan Note (Signed)
Screening for TB obtained today with DG CXR.

## 2019-12-25 NOTE — Assessment & Plan Note (Signed)
BV confirmed on wet prep. G/C Percell Locus is pending. Symptoms consistent with this.  - Treatment with Flagyl 500 BID x 7 days. Diflucan x1 after treatment. - F/U if symptoms not improving or getting worse.  - Will f/u on G/C Chlamydia and call in Rx if positive.  - Self care instructions given including avoiding douching. Handout given.  - F/U with PCP as needed.  - Return precautions including abdominal pain, fever, chills, nausea, or vomiting given.

## 2019-12-25 NOTE — Assessment & Plan Note (Signed)
Patient with significant cramping during cycles as well as irregular bleeding for the past few months.  Patient reports family history of fibroids.  Will obtain transvaginal and pelvic ultrasound in order to determine etiology.  Encourage patient to continue ibuprofen has helped her with her cramping.  If bleeding or cramping continues then patient to return for clinic for other options and may need to discuss endometrial biopsy based on ultrasound findings and symptoms.  Patient may also need to consider OCPs if she does not currently desire pregnancy in order to help control her symptoms.

## 2019-12-25 NOTE — ED Notes (Signed)
Pt gave registration labels and witness leaving

## 2019-12-25 NOTE — ED Triage Notes (Addendum)
Patient arrives to ED with complaints of RLQ abdominal pain starting today and vaginal odor for the past two days. Pt dx BV today. Patient here worried about her abdominal pain the most.

## 2019-12-26 LAB — HEPATITIS C ANTIBODY: Hep C Virus Ab: <0.1 s/co ratio (ref 0.0–0.9)

## 2019-12-26 LAB — RPR: RPR Ser Ql: NONREACTIVE

## 2019-12-26 LAB — HIV ANTIBODY (ROUTINE TESTING W REFLEX): HIV Screen 4th Generation wRfx: NONREACTIVE

## 2019-12-28 LAB — CERVICOVAGINAL ANCILLARY ONLY
Chlamydia: NEGATIVE
Comment: NEGATIVE
Comment: NORMAL
Neisseria Gonorrhea: NEGATIVE

## 2019-12-29 ENCOUNTER — Emergency Department (HOSPITAL_COMMUNITY)
Admission: EM | Admit: 2019-12-29 | Discharge: 2019-12-30 | Disposition: A | Discharge status: Home / Self Care | Payer: Medicaid Other | Attending: Emergency Medicine | Admitting: Emergency Medicine

## 2019-12-29 ENCOUNTER — Encounter (HOSPITAL_COMMUNITY): Payer: Self-pay

## 2019-12-29 ENCOUNTER — Other Ambulatory Visit: Payer: Self-pay

## 2019-12-29 DIAGNOSIS — R1031 Right lower quadrant pain: Secondary | ICD-10-CM | POA: Insufficient documentation

## 2019-12-29 DIAGNOSIS — Z5321 Procedure and treatment not carried out due to patient leaving prior to being seen by health care provider: Secondary | ICD-10-CM | POA: Insufficient documentation

## 2019-12-29 LAB — COMPREHENSIVE METABOLIC PANEL
ALT: 17 U/L (ref 0–44)
AST: 17 U/L (ref 15–41)
Albumin: 3.7 g/dL (ref 3.5–5.0)
Alkaline Phosphatase: 53 U/L (ref 38–126)
Anion gap: 6 (ref 5–15)
BUN: 10 mg/dL (ref 6–20)
CO2: 26 mmol/L (ref 22–32)
Calcium: 8.5 mg/dL — ABNORMAL LOW (ref 8.9–10.3)
Chloride: 107 mmol/L (ref 98–111)
Creatinine, Ser: 0.81 mg/dL (ref 0.44–1.00)
GFR calc Af Amer: >60 mL/min (ref >60)
GFR calc non Af Amer: >60 mL/min (ref >60)
Glucose, Bld: 95 mg/dL (ref 70–99)
Potassium: 4 mmol/L (ref 3.5–5.1)
Sodium: 139 mmol/L (ref 135–145)
Total Bilirubin: 0.5 mg/dL (ref 0.3–1.2)
Total Protein: 6.5 g/dL (ref 6.5–8.1)

## 2019-12-29 LAB — URINALYSIS, ROUTINE W REFLEX MICROSCOPIC
Bacteria, UA: NONE SEEN
Bilirubin Urine: NEGATIVE
Glucose, UA: NEGATIVE mg/dL
Hgb urine dipstick: NEGATIVE
Ketones, ur: NEGATIVE mg/dL
Nitrite: NEGATIVE
Protein, ur: NEGATIVE mg/dL
Specific Gravity, Urine: 1.021 (ref 1.005–1.030)
pH: 7 (ref 5.0–8.0)

## 2019-12-29 LAB — CBC
HCT: 39.7 % (ref 36.0–46.0)
Hemoglobin: 12.7 g/dL (ref 12.0–15.0)
MCH: 28.5 pg (ref 26.0–34.0)
MCHC: 32 g/dL (ref 30.0–36.0)
MCV: 89.2 fL (ref 80.0–100.0)
Platelets: 266 10*3/uL (ref 150–400)
RBC: 4.45 MIL/uL (ref 3.87–5.11)
RDW: 13.5 % (ref 11.5–15.5)
WBC: 8.5 10*3/uL (ref 4.0–10.5)
nRBC: 0 % (ref 0.0–0.2)

## 2019-12-29 LAB — I-STAT BETA HCG BLOOD, ED (MC, WL, AP ONLY): I-stat hCG, quantitative: <5 m[IU]/mL (ref <5)

## 2019-12-29 LAB — LIPASE, BLOOD: Lipase: 33 U/L (ref 11–51)

## 2019-12-29 NOTE — ED Triage Notes (Signed)
Pt sts ongoing RLQ abdominal pain since 6/4. Followed up with PCP and has a scheduled [place] on 15th.

## 2019-12-30 NOTE — ED Notes (Signed)
Pt eloped from waiting room prior to room call.

## 2020-01-05 ENCOUNTER — Ambulatory Visit
Admission: RE | Admit: 2020-01-05 | Discharge: 2020-01-05 | Disposition: A | Discharge status: Home / Self Care | Payer: Medicaid Other | Source: Ambulatory Visit | Attending: Family Medicine | Admitting: Family Medicine

## 2020-01-05 DIAGNOSIS — N939 Abnormal uterine and vaginal bleeding, unspecified: Secondary | ICD-10-CM

## 2020-01-18 ENCOUNTER — Other Ambulatory Visit: Payer: Self-pay | Admitting: Family Medicine

## 2020-01-18 ENCOUNTER — Telehealth: Payer: Self-pay

## 2020-01-18 DIAGNOSIS — N83201 Unspecified ovarian cyst, right side: Secondary | ICD-10-CM

## 2020-01-18 NOTE — Telephone Encounter (Signed)
Spoke with pt and informed her of her appt on 8/9 at 3:30. 3:15 check in time at [facility]. Pt is to have a full bladder for u/sound. Pt understood. Aquilla Solian, CMA

## 2020-02-29 ENCOUNTER — Other Ambulatory Visit: Payer: Self-pay

## 2020-02-29 ENCOUNTER — Ambulatory Visit (HOSPITAL_COMMUNITY)
Admission: RE | Admit: 2020-02-29 | Discharge: 2020-02-29 | Disposition: A | Discharge status: Home / Self Care | Payer: Self-pay | Source: Ambulatory Visit | Attending: Family Medicine | Admitting: Family Medicine

## 2020-02-29 DIAGNOSIS — N83201 Unspecified ovarian cyst, right side: Secondary | ICD-10-CM | POA: Insufficient documentation

## 2020-05-04 DIAGNOSIS — Z03818 Encounter for observation for suspected exposure to other biological agents ruled out: Secondary | ICD-10-CM | POA: Diagnosis not present

## 2020-05-11 ENCOUNTER — Other Ambulatory Visit: Payer: Self-pay

## 2020-05-11 ENCOUNTER — Ambulatory Visit (INDEPENDENT_AMBULATORY_CARE_PROVIDER_SITE_OTHER): Payer: Self-pay | Admitting: Family Medicine

## 2020-05-11 VITALS — BP 125/70 | HR 110 | Ht 66.0 in | Wt 192.4 lb

## 2020-05-11 DIAGNOSIS — N898 Other specified noninflammatory disorders of vagina: Secondary | ICD-10-CM

## 2020-05-11 DIAGNOSIS — N760 Acute vaginitis: Secondary | ICD-10-CM

## 2020-05-11 DIAGNOSIS — B9689 Other specified bacterial agents as the cause of diseases classified elsewhere: Secondary | ICD-10-CM | POA: Insufficient documentation

## 2020-05-11 LAB — POCT WET PREP (WET MOUNT)
Clue Cells Wet Prep Whiff POC: POSITIVE
Trichomonas Wet Prep HPF POC: ABSENT

## 2020-05-11 MED ORDER — FLUCONAZOLE 150 MG PO TABS
150.0000 mg | ORAL_TABLET | Freq: Once | ORAL | 0 refills | Status: AC
Start: 2020-05-11 — End: 2020-05-11

## 2020-05-11 MED ORDER — METRONIDAZOLE 500 MG PO TABS: 500.0000 mg | ORAL_TABLET | Freq: Three times a day (TID) | ORAL | 0 refills | Status: DC

## 2020-05-11 NOTE — Patient Instructions (Addendum)
It was a pleasure to see you today!  Thank you for choosing [facility] for your primary care.  Ruth [name] was seen for vaginal discharge.   Our plans for today were:  We will wait for the results of the swab today and if it is positive for BV, we will plan to treat with an oral medication. I will also prescribe diflucan in case you have symptoms of a yeast infections afterwards.    Please call the [facility] to schedule an appointment with Gynecology for further evaluation of your pelvic pain [phone]; there was a referral placed 6 months ago. I will resend another one if they require this, just send a MyChart message if you have any issues.  We briefly discussed potential for endometriosis based on your ultrasound and pelvic pain distribution, I included some information below for you to review. This may or may not be the cause of your pain.   You should return to our clinic in 1 month for follow up for pelvic pain.   Best Wishes,   Dr. Neita Garnet    Endometriosis  Endometriosis is a condition in which the tissue that lines the uterus (endometrium) grows outside of its normal location. The tissue may grow in many locations close to the uterus, but it commonly grows on the ovaries, fallopian tubes, vagina, or bowel. When the uterus sheds the endometrium every menstrual cycle, there is bleeding wherever the endometrial tissue is located. This can cause pain because blood is irritating to tissues that are not normally exposed to it. What are the causes? The cause of endometriosis is not known. What increases the risk? You may be more likely to develop endometriosis if you:  Have a family history of endometriosis.  Have never given birth.  Started your period at age 21 or younger.  Have high levels of estrogen in your body.  Were exposed to a certain medicine (diethylstilbestrol) before you were born (in utero).  Had low birth weight.  Were born  as a twin, triplet, or other multiple.  Have a BMI of less than 25. BMI is an estimate of body fat and is calculated from height and weight. What are the signs or symptoms? Often, there are no symptoms of this condition. If you do have symptoms, they may:  Vary depending on where your endometrial tissue is growing.  Occur during your menstrual period (most common) or midcycle.  Come and go, or you may go months with no symptoms at all.  Stop with menopause. Symptoms may include:  Pain in the back or abdomen.  Heavier bleeding during periods.  Pain during sex.  Painful bowel movements.  Infertility.  Pelvic pain.  Bleeding more than once a month. How is this diagnosed? This condition is diagnosed based on your symptoms and a physical exam. You may have tests, such as:  Blood tests and urine tests. These may be done to help rule out other possible causes of your symptoms.  Ultrasound, to look for abnormal tissues.  An X-ray of the lower bowel (barium enema).  An ultrasound that is done through the vagina (transvaginally).  CT scan.  MRI.  Laparoscopy. In this procedure, a lighted, pencil-sized instrument called a laparoscope is inserted into your abdomen through an incision. The laparoscope allows your health care provider to look at the organs inside your body and check for abnormal tissue to confirm the diagnosis. If abnormal tissue is found, your health care provider may remove a  small piece of tissue (biopsy) to be examined under a microscope. How is this treated? Treatment for this condition may include:  Medicines to relieve pain, such as NSAIDs.  Hormone therapy. This involves using artificial (synthetic) hormones to reduce endometrial tissue growth. Your health care provider may recommend using a hormonal form of birth control, or other medicines.  Surgery. This may be done to remove abnormal endometrial tissue. ? In some cases, tissue may be removed using a  laparoscope and a laser (laparoscopic laser treatment). ? In severe cases, surgery may be done to remove the fallopian tubes, uterus, and ovaries (hysterectomy). Follow these instructions at home:  Take over-the-counter and prescription medicines only as told by your health care provider.  Do not drive or use heavy machinery while taking prescription pain medicine.  Try to avoid activities that cause pain, including sexual activity.  Keep all follow-up visits as told by your health care provider. This is important. Contact a health care provider if:  You have pain in the area between your hip bones (pelvic area) that occurs: ? Before, during, or after your period. ? In between your period and gets worse during your period. ? During or after sex. ? With bowel movements or urination, especially during your period.  You have problems getting pregnant.  You have a fever. Get help right away if:  You have severe pain that does not get better with medicine.  You have severe nausea and vomiting, or you cannot eat without vomiting.  You have pain that affects only the lower, right side of your abdomen.  You have abdominal pain that gets worse.  You have abdominal swelling.  You have blood in your stool. This information is not intended to replace advice given to you by your health care provider. Make sure you discuss any questions you have with your health care provider. Document Revised: 06/21/2017 Document Reviewed: 12/10/2015 Elsevier Patient Education  2020 [employer].

## 2020-05-11 NOTE — Progress Notes (Signed)
    SUBJECTIVE:   CHIEF COMPLAINT / HPI: vaginal discharge   Patient reports frequent re-occurrences of bacterial vaginosis symptoms including vaginal discharge and some pruritis that results in irritation to her vulva.  She reports that soon after completion of her menses, she begins to have discharge consistent with previous bacterial vaginosis occurrences. She states that she has tried abstaining from intercourse for 1 month with no changes to the reoccurrences.  Patient also reports some back and hip pain that occurs with her menses as well as pain with bowel movments. Patient states that she has previously completed all of the antibiotics for her bacterial vaginosis diagnosis, stopped smoking and increased vegetable intake and water in her diet.  She has tried the metronidazole gel in the past which caused her to have irritation.  Patient is no longer on birth control, previously used Depo-Provera injections for 16 years.  PERTINENT  PMH / PSH:  AUB  Back Pain   OBJECTIVE:   BP 125/70   Pulse (!) 110   Ht 5\' 6"  (1.676 m)   Wt 192 lb 6.4 oz (87.3 kg)   LMP 04/21/2020 (Exact Date)   SpO2 100%   BMI 31.05 kg/m    General: female appearing stated age in no acute distress Abdomen: Bowel sounds normal. Abdomen soft  Extremities: No peripheral edema. Warm/ well perfused.  GU: milky/gray  discharge pooled in vaginal vault,  cervix visualized without lesions or bleeding, no cervical motion tenderness on bimanual exam, tenderness in left adnexal region, unable to palpate enlarged ovaries on bimanual exam, normal external vulva    ASSESSMENT/PLAN:   Bacterial vaginosis Diagnosis most consistent with BV given association with menses. Considered vaginal candidiasis but symptoms less consistent with this. Could also consider STI but patient declines testing today. Pelvic pain associated with menses could be dysmenorrhea, primary; also suspicious for endometriosis given distribution of pain and  association with menses.  Wet prep showed clue cells and multiple bacteria  Prescribed metronidazole for 7 days course per patient request, previously reports irritation with gel application  Recommended added probiotic to diet  GYN referral sent 6 months ago for pelvic pain, gave patient femina contact info to call and schedule appt  Advised patient to contact our office if she needs another referral   Follow up in 4 weeks   04/23/2020, MD [facility]

## 2020-05-11 NOTE — Assessment & Plan Note (Signed)
Diagnosis most consistent with BV given association with menses. Considered vaginal candidiasis but symptoms less consistent with this. Could also consider STI but patient declines testing today. Pelvic pain associated with menses could be dysmenorrhea, primary; also suspicious for endometriosis given distribution of pain and association with menses.  Wet prep showed clue cells and multiple bacteria  Prescribed metronidazole for 7 days course per patient request, previously reports irritation with gel application  Recommended added probiotic to diet  GYN referral sent 6 months ago for pelvic pain, gave patient [facility] contact info to call and schedule appt  Advised patient to contact our office if she needs another referral

## 2020-06-08 ENCOUNTER — Other Ambulatory Visit: Payer: Self-pay

## 2020-06-08 ENCOUNTER — Ambulatory Visit (INDEPENDENT_AMBULATORY_CARE_PROVIDER_SITE_OTHER): Payer: Self-pay | Admitting: Obstetrics & Gynecology

## 2020-06-08 ENCOUNTER — Encounter: Payer: Self-pay | Admitting: Obstetrics & Gynecology

## 2020-06-08 VITALS — BP 91/68 | HR 83 | Wt 206.7 lb

## 2020-06-08 DIAGNOSIS — N946 Dysmenorrhea, unspecified: Secondary | ICD-10-CM | POA: Insufficient documentation

## 2020-06-08 DIAGNOSIS — N939 Abnormal uterine and vaginal bleeding, unspecified: Secondary | ICD-10-CM

## 2020-06-08 NOTE — Progress Notes (Signed)
History:  Ruth [name] is a 39 y.o. [id number] who presents to clinic today for dysmenorrhea and increased menstrual bleeding.    The pain has been present for 3 years and has been increasing in intensity. In 1999 patient has been on oral birth control for about a year. For the next 15-17 years patient has been on and off of Depo-provera shots for contraception. While she was on birth control, she did not expereicned dysmenorrhea. On 2018 patient had tubal ligation and the dysmenorrhea started. Her menstrual cycle lasts for 5-8 days with increased abdominal and hip pain during menstrual cycle. She also has hip and pelvic pain even outside of the menstrual cycle.   She saw her PCP for these symptoms and had two ultrasound completed. The U/S showed  resolving hemorrhagic , previously seen 2.7 cm complex cyst has resolved, and 5 mm intramural fibroid, stable. Patient does not want to take OCPs, Depo shots, or IUD placement. We talked extensively about treatment options and patient is interested in endometrial ablation.   The following portions of the patient's history were reviewed and updated as appropriate: allergies, current medications, family history, past medical history, social history, past surgical history and problem list.  Review of Systems:  Review of Systems  Constitutional: Negative.   HENT: Negative.   Eyes: Negative.   Respiratory: Negative.   Cardiovascular: Negative.   Gastrointestinal: Negative.   Genitourinary: Negative for dysuria and urgency.  Musculoskeletal: Positive for back pain.  Skin: Negative.   Neurological: Negative.   Endo/Heme/Allergies: Negative.   Psychiatric/Behavioral: Negative.       Objective:  Physical Exam BP 91/68   Pulse 83   Wt 206 lb 11.2 oz (93.8 kg)   LMP 05/14/2020 (Exact Date)   BMI 33.36 kg/m  Physical Exam Vitals and nursing note reviewed. Exam conducted with a chaperone present.  Cardiovascular:     Rate and Rhythm: Normal rate  and regular rhythm.  Genitourinary:    General: Normal vulva.     Labia:        Right: No rash.        Left: No rash.      Vagina: Normal.     Cervix: Normal.     Uterus: Normal.      Adnexa: Right adnexa normal and left adnexa normal.  Musculoskeletal:        General: Normal range of motion.  Neurological:     Mental Status: She is alert and oriented to person, place, and time.  Psychiatric:        Mood and Affect: Mood is anxious.        Behavior: Behavior normal.        Thought Content: Thought content normal.        Judgment: Judgment normal.      Labs and Imaging No results found for this or any previous visit (from the past 24 hour(s)).  No results found.   Assessment & Plan:   1. Abnormal uterine bleeding  - We talked extensively about treatment options. Patient is not interested in OCP, IUD, or depo.  - Patient is interested in endometrial ablation  - Future appointment is set for procedure planning    2. Dysmenorrhea  - Most likely related to her abdominal uterine bleeding  - Recommend taking NSAID for symptom control and medication sent to pharmacy today - Possible future endometrial ablation. She would need endometrial biopsy for preop evaluation  3. Pelvic Pain - Pain is also present outside  of the menstrual window. She describes low back and hip pain that may be MS in origin - Follow-up with PCP for future symptom management  - Extra-strength tylenol sent to pharmacy for pain control    Malen Gauze, Medical Student 06/08/2020 10:10 AM   Attestation of Attending Supervision of Medical Student: Evaluation and management procedures were performed by the medical student under my supervision and collaboration.  I have reviewed the student's note and chart, and I agree with the management and plan.  Scheryl Darter, MD, FACOG Attending Obstetrician & Gynecologist Faculty Practice, [facility]

## 2020-06-08 NOTE — Patient Instructions (Signed)
Endometrial Ablation  Endometrial ablation is a procedure that destroys the thin inner layer of the lining of the uterus (endometrium). This procedure may be done:  To stop heavy periods.  To stop bleeding that is causing anemia.  To control irregular bleeding.  To treat bleeding caused by small tumors (fibroids) in the endometrium.  This procedure is often an alternative to major surgery, such as removal of the uterus and cervix (hysterectomy). As a result of this procedure:  You may not be able to have children. However, if you are premenopausal (you have not gone through menopause):  You may still have a small chance of getting pregnant.  You will need to use a reliable method of birth control after the procedure to prevent pregnancy.  You may stop having a menstrual period, or you may have only a small amount of bleeding during your period. Menstruation may return several years after the procedure.  Tell a health care provider about:  Any allergies you have.  All medicines you are taking, including vitamins, herbs, eye drops, creams, and over-the-counter medicines.  Any problems you or family members have had with the use of anesthetic medicines.  Any blood disorders you have.  Any surgeries you have had.  Any medical conditions you have.  What are the risks?  Generally, this is a safe procedure. However, problems may occur, including:  A hole (perforation) in the uterus or bowel.  Infection of the uterus, bladder, or vagina.  Bleeding.  Damage to other structures or organs.  An air bubble in the lung (air embolus).  Problems with pregnancy after the procedure.  Failure of the procedure.  Decreased ability to diagnose cancer in the endometrium.  What happens before the procedure?  You will have tests of your endometrium to make sure there are no pre-cancerous cells or cancer cells present.  You may have an ultrasound of the uterus.  You may be given medicines to thin the endometrium.  Ask your health care  provider about:  Changing or stopping your regular medicines. This is especially important if you take diabetes medicines or blood thinners.  Taking medicines such as aspirin and ibuprofen. These medicines can thin your blood. Do not take these medicines before your procedure if your doctor tells you not to.  Plan to have someone take you home from the hospital or clinic.  What happens during the procedure?    You will lie on an exam table with your feet and legs supported as in a pelvic exam.  To lower your risk of infection:  Your health care team will wash or sanitize their hands and put on germ-free (sterile) gloves.  Your genital area will be washed with soap.  An IV tube will be inserted into one of your veins.  You will be given a medicine to help you relax (sedative).  A surgical instrument with a light and camera (resectoscope) will be inserted into your vagina and moved into your uterus. This allows your surgeon to see inside your uterus.  Endometrial tissue will be removed using one of the following methods:  Radiofrequency. This method uses a radiofrequency-alternating electric current to remove the endometrium.  Cryotherapy. This method uses extreme cold to freeze the endometrium.  Heated-free liquid. This method uses a heated saltwater (saline) solution to remove the endometrium.  Microwave. This method uses high-energy microwaves to heat up the endometrium and remove it.  Thermal balloon. This method involves inserting a catheter with a balloon tip into   the uterus. The balloon tip is filled with heated fluid to remove the endometrium.  The procedure may vary among health care providers and hospitals.  What happens after the procedure?  Your blood pressure, heart rate, breathing rate, and blood oxygen level will be monitored until the medicines you were given have worn off.  As tissue healing occurs, you may notice vaginal bleeding for 4-6 weeks after the procedure. You may also  experience:  Cramps.  Thin, watery vaginal discharge that is light pink or [place] in color.  A need to urinate more frequently than usual.  Nausea.  Do not drive for 24 hours if you were given a sedative.  Do not have sex or insert anything into your vagina until your health care provider approves.  Summary  Endometrial ablation is done to treat the many causes of heavy menstrual bleeding.  The procedure may be done only after medications have been tried to control the bleeding.  Plan to have someone take you home from the hospital or clinic.  This information is not intended to replace advice given to you by your health care provider. Make sure you discuss any questions you have with your health care provider.  Document Revised: 12/24/2017 Document Reviewed: 07/26/2016  Elsevier Patient Education  2020 [employer]

## 2020-06-22 ENCOUNTER — Ambulatory Visit: Payer: Medicaid Other | Admitting: Family Medicine

## 2020-07-07 ENCOUNTER — Ambulatory Visit (INDEPENDENT_AMBULATORY_CARE_PROVIDER_SITE_OTHER): Payer: Self-pay | Admitting: Obstetrics & Gynecology

## 2020-07-07 ENCOUNTER — Other Ambulatory Visit: Payer: Self-pay

## 2020-07-07 ENCOUNTER — Other Ambulatory Visit (HOSPITAL_COMMUNITY)
Admission: RE | Admit: 2020-07-07 | Discharge: 2020-07-07 | Disposition: A | Discharge status: Home / Self Care | Payer: Medicaid Other | Source: Ambulatory Visit | Attending: Obstetrics & Gynecology | Admitting: Obstetrics & Gynecology

## 2020-07-07 ENCOUNTER — Encounter: Payer: Self-pay | Admitting: Obstetrics & Gynecology

## 2020-07-07 VITALS — BP 120/95 | HR 71 | Ht 66.0 in | Wt 194.6 lb

## 2020-07-07 DIAGNOSIS — N946 Dysmenorrhea, unspecified: Secondary | ICD-10-CM

## 2020-07-07 DIAGNOSIS — N939 Abnormal uterine and vaginal bleeding, unspecified: Secondary | ICD-10-CM | POA: Insufficient documentation

## 2020-07-07 DIAGNOSIS — Z01812 Encounter for preprocedural laboratory examination: Secondary | ICD-10-CM

## 2020-07-07 LAB — POCT PREGNANCY, URINE: Preg Test, Ur: NEGATIVE

## 2020-07-07 NOTE — Progress Notes (Signed)
Patient given informed consent, signed copy in the chart, time out was performed. Appropriate time out taken. . The patient was placed in the lithotomy position and the cervix brought into view with sterile speculum.  Portio of cervix cleansed x 2 with betadine swabs.  A tenaculum was placed in the anterior lip of the cervix.  The uterus was sounded for depth of 9 cm. A pipelle was introduced to into the uterus, suction created,  and an endometrial sample was obtained. All equipment was removed and accounted for.  The patient tolerated the procedure well.  Patient desires surgical management with ablation.  The risks of surgery were discussed in detail with the patient including but not limited to: bleeding which may require transfusion or reoperation; infection which may require prolonged hospitalization or re-hospitalization and antibiotic therapy; injury to bowel, bladder, ureters and major vessels or other surrounding organs; formation of adhesions; need for additional procedures including laparotomy or subsequent procedures secondary to abnormal pathology; thromboembolic phenomenon; incisional problems and other postoperative or anesthesia complications.  Patient was told that the likelihood that her condition and symptoms will be treated effectively with this surgical management was very high; the postoperative expectations were also discussed in detail. The patient also understands the alternative treatment options which were discussed in full. All questions were answered.  She was told that she will be contacted by our surgical scheduler regarding the time and date of her surgery; routine preoperative instructions will be given to her by the preoperative nursing team.   She is aware of need for preoperative COVID testing and subsequent quarantine from time of test to time of surgery; she will be given further preoperative instructions at that COVID screening visit.  Printed patient education handouts about  the procedure were given to the patient to review at home.  Adam Phenix, MD\

## 2020-07-07 NOTE — Patient Instructions (Signed)
Endometrial Ablation  Endometrial ablation is a procedure that destroys the thin inner layer of the lining of the uterus (endometrium). This procedure may be done:  To stop heavy periods.  To stop bleeding that is causing anemia.  To control irregular bleeding.  To treat bleeding caused by small tumors (fibroids) in the endometrium.  This procedure is often an alternative to major surgery, such as removal of the uterus and cervix (hysterectomy). As a result of this procedure:  You may not be able to have children. However, if you are premenopausal (you have not gone through menopause):  You may still have a small chance of getting pregnant.  You will need to use a reliable method of birth control after the procedure to prevent pregnancy.  You may stop having a menstrual period, or you may have only a small amount of bleeding during your period. Menstruation may return several years after the procedure.  Tell a health care provider about:  Any allergies you have.  All medicines you are taking, including vitamins, herbs, eye drops, creams, and over-the-counter medicines.  Any problems you or family members have had with the use of anesthetic medicines.  Any blood disorders you have.  Any surgeries you have had.  Any medical conditions you have.  What are the risks?  Generally, this is a safe procedure. However, problems may occur, including:  A hole (perforation) in the uterus or bowel.  Infection of the uterus, bladder, or vagina.  Bleeding.  Damage to other structures or organs.  An air bubble in the lung (air embolus).  Problems with pregnancy after the procedure.  Failure of the procedure.  Decreased ability to diagnose cancer in the endometrium.  What happens before the procedure?  You will have tests of your endometrium to make sure there are no pre-cancerous cells or cancer cells present.  You may have an ultrasound of the uterus.  You may be given medicines to thin the endometrium.  Ask your health care  provider about:  Changing or stopping your regular medicines. This is especially important if you take diabetes medicines or blood thinners.  Taking medicines such as aspirin and ibuprofen. These medicines can thin your blood. Do not take these medicines before your procedure if your doctor tells you not to.  Plan to have someone take you home from the hospital or clinic.  What happens during the procedure?    You will lie on an exam table with your feet and legs supported as in a pelvic exam.  To lower your risk of infection:  Your health care team will wash or sanitize their hands and put on germ-free (sterile) gloves.  Your genital area will be washed with soap.  An IV tube will be inserted into one of your veins.  You will be given a medicine to help you relax (sedative).  A surgical instrument with a light and camera (resectoscope) will be inserted into your vagina and moved into your uterus. This allows your surgeon to see inside your uterus.  Endometrial tissue will be removed using one of the following methods:  Radiofrequency. This method uses a radiofrequency-alternating electric current to remove the endometrium.  Cryotherapy. This method uses extreme cold to freeze the endometrium.  Heated-free liquid. This method uses a heated saltwater (saline) solution to remove the endometrium.  Microwave. This method uses high-energy microwaves to heat up the endometrium and remove it.  Thermal balloon. This method involves inserting a catheter with a balloon tip into   the uterus. The balloon tip is filled with heated fluid to remove the endometrium.  The procedure may vary among health care providers and hospitals.  What happens after the procedure?  Your blood pressure, heart rate, breathing rate, and blood oxygen level will be monitored until the medicines you were given have worn off.  As tissue healing occurs, you may notice vaginal bleeding for 4-6 weeks after the procedure. You may also  experience:  Cramps.  Thin, watery vaginal discharge that is light pink or Rambo in color.  A need to urinate more frequently than usual.  Nausea.  Do not drive for 24 hours if you were given a sedative.  Do not have sex or insert anything into your vagina until your health care provider approves.  Summary  Endometrial ablation is done to treat the many causes of heavy menstrual bleeding.  The procedure may be done only after medications have been tried to control the bleeding.  Plan to have someone take you home from the hospital or clinic.  This information is not intended to replace advice given to you by your health care provider. Make sure you discuss any questions you have with your health care provider.  Document Revised: 12/24/2017 Document Reviewed: 07/26/2016  Elsevier Patient Education  2020 [employer]

## 2020-07-11 LAB — SURGICAL PATHOLOGY

## 2020-07-14 ENCOUNTER — Emergency Department (HOSPITAL_COMMUNITY)
Admission: EM | Admit: 2020-07-14 | Discharge: 2020-07-14 | Disposition: A | Discharge status: Home / Self Care | Payer: Medicaid Other | Attending: Emergency Medicine | Admitting: Emergency Medicine

## 2020-07-14 ENCOUNTER — Encounter (HOSPITAL_COMMUNITY): Payer: Self-pay | Admitting: *Deleted

## 2020-07-14 ENCOUNTER — Other Ambulatory Visit: Payer: Self-pay

## 2020-07-14 DIAGNOSIS — Z5321 Procedure and treatment not carried out due to patient leaving prior to being seen by health care provider: Secondary | ICD-10-CM | POA: Insufficient documentation

## 2020-07-14 DIAGNOSIS — R1032 Left lower quadrant pain: Secondary | ICD-10-CM | POA: Insufficient documentation

## 2020-07-14 LAB — I-STAT BETA HCG BLOOD, ED (MC, WL, AP ONLY): I-stat hCG, quantitative: <5 m[IU]/mL (ref <5)

## 2020-07-14 LAB — COMPREHENSIVE METABOLIC PANEL
ALT: 17 U/L (ref 0–44)
AST: 19 U/L (ref 15–41)
Albumin: 3.2 g/dL — ABNORMAL LOW (ref 3.5–5.0)
Alkaline Phosphatase: 47 U/L (ref 38–126)
Anion gap: 9 (ref 5–15)
BUN: 5 mg/dL — ABNORMAL LOW (ref 6–20)
CO2: 25 mmol/L (ref 22–32)
Calcium: 8.4 mg/dL — ABNORMAL LOW (ref 8.9–10.3)
Chloride: 105 mmol/L (ref 98–111)
Creatinine, Ser: 0.78 mg/dL (ref 0.44–1.00)
GFR, Estimated: >60 mL/min (ref >60)
Glucose, Bld: 141 mg/dL — ABNORMAL HIGH (ref 70–99)
Potassium: 3.5 mmol/L (ref 3.5–5.1)
Sodium: 139 mmol/L (ref 135–145)
Total Bilirubin: 0.3 mg/dL (ref 0.3–1.2)
Total Protein: 5.6 g/dL — ABNORMAL LOW (ref 6.5–8.1)

## 2020-07-14 LAB — CBC
HCT: 40.7 % (ref 36.0–46.0)
Hemoglobin: 12.8 g/dL (ref 12.0–15.0)
MCH: 28 pg (ref 26.0–34.0)
MCHC: 31.4 g/dL (ref 30.0–36.0)
MCV: 89.1 fL (ref 80.0–100.0)
Platelets: 324 10*3/uL (ref 150–400)
RBC: 4.57 MIL/uL (ref 3.87–5.11)
RDW: 13.2 % (ref 11.5–15.5)
WBC: 5.8 10*3/uL (ref 4.0–10.5)
nRBC: 0 % (ref 0.0–0.2)

## 2020-07-14 LAB — URINALYSIS, ROUTINE W REFLEX MICROSCOPIC
Bacteria, UA: NONE SEEN
Bilirubin Urine: NEGATIVE
Glucose, UA: NEGATIVE mg/dL
Hgb urine dipstick: NEGATIVE
Ketones, ur: NEGATIVE mg/dL
Nitrite: NEGATIVE
Protein, ur: NEGATIVE mg/dL
Specific Gravity, Urine: 1.02 (ref 1.005–1.030)
pH: 6 (ref 5.0–8.0)

## 2020-07-14 LAB — LIPASE, BLOOD: Lipase: 31 U/L (ref 11–51)

## 2020-07-14 MED ORDER — ONDANSETRON 4 MG PO TBDP
4.0000 mg | ORAL_TABLET | Freq: Once | ORAL | Status: AC | PRN
  Administered 2020-07-14: 4 mg via ORAL
  Filled 2020-07-14: qty 1

## 2020-07-14 NOTE — ED Triage Notes (Signed)
C/o left lower abd. Pain onset 1 hour ago

## 2020-07-14 NOTE — ED Notes (Signed)
Pt checked out AMA. 

## 2020-07-16 ENCOUNTER — Emergency Department (HOSPITAL_COMMUNITY)
Admission: EM | Admit: 2020-07-16 | Discharge: 2020-07-16 | Disposition: A | Discharge status: Home / Self Care | Payer: Medicaid Other | Attending: Emergency Medicine | Admitting: Emergency Medicine

## 2020-07-16 ENCOUNTER — Other Ambulatory Visit: Payer: Self-pay

## 2020-07-16 ENCOUNTER — Emergency Department (HOSPITAL_COMMUNITY): Payer: Medicaid Other

## 2020-07-16 ENCOUNTER — Encounter (HOSPITAL_COMMUNITY): Payer: Self-pay

## 2020-07-16 DIAGNOSIS — R109 Unspecified abdominal pain: Secondary | ICD-10-CM | POA: Insufficient documentation

## 2020-07-16 DIAGNOSIS — Z87891 Personal history of nicotine dependence: Secondary | ICD-10-CM | POA: Insufficient documentation

## 2020-07-16 DIAGNOSIS — S29012A Strain of muscle and tendon of back wall of thorax, initial encounter: Secondary | ICD-10-CM

## 2020-07-16 DIAGNOSIS — Z20822 Contact with and (suspected) exposure to covid-19: Secondary | ICD-10-CM | POA: Insufficient documentation

## 2020-07-16 DIAGNOSIS — I10 Essential (primary) hypertension: Secondary | ICD-10-CM | POA: Insufficient documentation

## 2020-07-16 DIAGNOSIS — J4520 Mild intermittent asthma, uncomplicated: Secondary | ICD-10-CM | POA: Insufficient documentation

## 2020-07-16 LAB — RESP PANEL BY RT-PCR (FLU A&B, COVID) ARPGX2
Influenza A by PCR: NEGATIVE
Influenza B by PCR: NEGATIVE
SARS Coronavirus 2 by RT PCR: NEGATIVE

## 2020-07-16 MED ORDER — METHOCARBAMOL 500 MG PO TABS
500.0000 mg | ORAL_TABLET | Freq: Two times a day (BID) | ORAL | 0 refills | Status: DC
Start: 2020-07-16 — End: 2020-08-11

## 2020-07-16 MED ORDER — IBUPROFEN 400 MG PO TABS
600.0000 mg | ORAL_TABLET | Freq: Once | ORAL | Status: AC
  Administered 2020-07-16: 600 mg via ORAL
  Filled 2020-07-16: qty 1

## 2020-07-16 NOTE — ED Provider Notes (Signed)
[facility] EMERGENCY DEPARTMENT Provider Note   CSN: [id number] Arrival date & time: 07/16/20  1230     History Chief Complaint  Patient presents with  . Flank Pain    Ruth [name] is a 39 y.o. female who presents with concern for right-sided upper back pain x5 days.  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 She denies known injury while at work, however states has become progressively more sore over the last few days.  She states that the pain is worse when she moves, improved with ibuprofen and mildly with massage, however continues to be painful.  Improved with heat application.  Pain is limited to her right mid back.  It does not radiate around to her chest, abdomen, groin or pelvis.  She denies dysuria, hematuria, urinary frequency or urgency.  Denies nausea or vomiting, denies diarrhea.  Patient with history of menstrual issues, scheduled for D&C in January of next year. She endorses history of kidney stones, however states that this feels much differently.  Denies numbness, tingling and weakness in her extremities.  Patient denies fevers or chills at home.  She was recently exposed to her boyfriend who tested positive for COVID-19 today.  Requesting COVID-19 testing today as well.  She is not vaccinated as COVID-19.  I personally reviewed this patient's medical records.  She has history of asthma, postpartum depression, obesity, HPV, and abnormal uterine bleeding.  HPI     Past Medical History:  Diagnosis Date  . Asthma   . GERD (gastroesophageal reflux disease)   . Hypertension during pregnancy  . Pneumonia    2013    Patient Active Problem List   Diagnosis Date Noted  . Dysmenorrhea 06/08/2020  . Bacterial vaginosis 05/11/2020  . Abnormal uterine bleeding (AUB) 12/25/2019  . Screening for hyperlipidemia 09/16/2019  . Vaginal discharge 03/12/2019  . 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 Health maintenance examination 03/13/2016  . Back pain 03/13/2016  . Left knee injury 06/22/2014  . Obesity (BMI 30-39.9) 06/25/2013  . Contact dermatitis 06/15/2013  . Cerumen impaction 08/22/2012  . Birth control 03/14/2012  . Constipation 11/09/2011  . Left knee pain 07/09/2011  . 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 ASTHMA, INTERMITTENT 02/19/2007    Past Surgical History:  Procedure Laterality Date  . CHOLECYSTECTOMY  10/31/2011   Procedure: LAPAROSCOPIC CHOLECYSTECTOMY;  Surgeon: Liz Malady, MD;  Location: [facility] OR;  Service: General;  Laterality: N/A;  . DILATION AND CURETTAGE OF UTERUS    . KNEE SURGERY Left 08/2014  . LAPAROSCOPIC TUBAL LIGATION Bilateral 07/24/2016   Procedure: LAPAROSCOPIC TUBAL LIGATION;  Surgeon: Adam Phenix, MD;  Location: [facility] ORS;  Service: Gynecology;  Laterality: Bilateral;  . WISDOM TOOTH EXTRACTION       OB History    Gravida  6   Para  4   Term  4   Preterm  0   AB  2   Living  4     SAB  2   IAB  0   Ectopic  0   Multiple  0   Live Births  4        Obstetric Comments  Have 4 children, vaginal deliveries         Family History  Problem Relation Age of Onset  . Drug abuse Mother   . Congestive Heart Failure Mother   . Cancer Father  Social History   Tobacco Use  . Smoking status: Former Smoker    Packs/day: 0.20    Types: Cigarettes, Cigars  . Smokeless tobacco: Never Used  Vaping Use  . Vaping Use: Never used  Substance Use Topics  . Alcohol use: Yes    Alcohol/week: 1.0 standard drink    Types: 1 Glasses of wine per week    Comment: socially   . Drug use: Yes    Types: Marijuana    Comment: occassionally     Home Medications Prior to Admission medications   Not on File    Allergies    Hydrocodone-acetaminophen and Hydrocodone-acetaminophen  Review of Systems   Review of Systems  Constitutional: Negative for activity change, appetite change, chills, diaphoresis,  fatigue and fever.  HENT: Negative.   Respiratory: Negative.   Cardiovascular: Negative.   Gastrointestinal: Negative.   Genitourinary: Negative.   Musculoskeletal: Positive for back pain. Negative for myalgias.  Skin: Negative.   Neurological: Negative.   Hematological: Negative.     Physical Exam Updated Vital Signs BP 136/89   Pulse 83   Temp 97.8 F (36.6 C) (Oral)   Resp 20   LMP 07/01/2020 (Approximate)   SpO2 100%   Physical Exam Vitals and nursing note reviewed.  Constitutional:      Appearance: She is obese. She is not ill-appearing.  HENT:     Head: Normocephalic and atraumatic.     Nose: Nose normal.     Mouth/Throat:     Mouth: Mucous membranes are moist.     Pharynx: No oropharyngeal exudate or posterior oropharyngeal erythema.  Eyes:     General:        Right eye: No discharge.        Left eye: No discharge.     Conjunctiva/sclera: Conjunctivae normal.  Cardiovascular:     Rate and Rhythm: Normal rate and regular rhythm.     Pulses: Normal pulses.          Radial pulses are 2+ on the right side and 2+ on the left side.       Dorsalis pedis pulses are 2+ on the right side and 2+ on the left side.     Heart sounds: Normal heart sounds. No murmur heard.   Pulmonary:     Effort: Pulmonary effort is normal. No respiratory distress.     Breath sounds: Normal breath sounds. No wheezing or rales.  Chest:     Chest wall: No lacerations, deformity, swelling, tenderness, crepitus or edema.  Abdominal:     General: Bowel sounds are normal. There is no distension.     Palpations: Abdomen is soft.     Tenderness: There is no abdominal tenderness. There is no right CVA tenderness, left CVA tenderness, guarding or rebound.  Musculoskeletal:        General: No deformity.     Cervical back: Neck supple. Spasms present. No rigidity, tenderness, bony tenderness or crepitus. No pain with movement, spinous process tenderness or muscular tenderness.     Thoracic back:  Spasms and tenderness present. No bony tenderness. Normal range of motion.     Lumbar back: Normal. No spasms, tenderness or bony tenderness.       Back:     Right lower leg: No edema.     Left lower leg: No edema.  Lymphadenopathy:     Cervical: No cervical adenopathy.  Skin:    General: Skin is warm and dry.     Capillary Refill:  Capillary refill takes less than 2 seconds.     Findings: No rash.  Neurological:     General: No focal deficit present.     Mental Status: She is alert and oriented to person, place, and time. Mental status is at baseline.     Sensory: Sensation is intact.     Motor: Motor function is intact.     Gait: Gait is intact.  Psychiatric:        Mood and Affect: Mood normal.     ED Results / Procedures / Treatments   Labs (all labs ordered are listed, but only abnormal results are displayed) Labs Reviewed  RESP PANEL BY RT-PCR (FLU A&B, COVID) ARPGX2    EKG None  Radiology DG Ribs Unilateral W/Chest Right  Result Date: 07/16/2020 CLINICAL DATA:  Right rib pain EXAM: RIGHT RIBS AND CHEST - 3+ VIEW COMPARISON:  12/25/2019 FINDINGS: No fracture or other bone lesions are seen involving the ribs. There is no evidence of pneumothorax or pleural effusion. Both lungs are clear. Heart size and mediastinal contours are within normal limits. IMPRESSION: Negative. Electronically Signed   By: Duanne Guess D.O.   On: 07/16/2020 13:01    Procedures Procedures (including critical care time)  Medications Ordered in ED Medications  ibuprofen (ADVIL) tablet 600 mg (has no administration in time range)    ED Course  I have reviewed the triage vital signs and the nursing notes.  Pertinent labs & imaging results that were available during my care of the patient were reviewed by me and considered in my medical decision making (see chart for details).    MDM Rules/Calculators/A&P                         39 year old female who presents with concern for right  mid back pain, worsened with movement for the last 5 days.  Recently started a new job that involves much physical labor.  Differential diagnosis for this patient symptoms include but are not limited to muscle strain/muscle spasm, herpes zoster, nephrolithiasis, epidural abscess, spondylolisthesis, abdominal aortic aneurysm.   Vital signs normal on intake.  X-ray of the ribs and chest on the right was negative for acute fracture or other bony lesion. No evidence of abnormality in the lungs.  Cardiopulmonary exam is normal, abdominal exam is benign.  No CVA tenderness.  Obvious spasm of the trapezius bilaterally L>R, as well as the right latissimus dorsi, with associated tenderness to palpation.  No skin changes present.  Physical exam most consistent with muscle strain and secondary muscle spasm.  There are no skin changes to suggest herpes zoster, and given benign abdominal exam and lack of urinary symptoms, we will not proceed with work-up for renal stone at this time.  No further work-up is warranted in the emergency department.  Respiratory pathogen panel negative for COVID-19, influenza A/B.  Will discharge with prescription for Robaxin, patient preferred over-the-counter Tylenol and ibuprofen.  She may utilize topical analgesia as well  Haylyn voiced understanding of her medical evaluation and treatment plan.  Each of her questions were answered to her expressed satisfaction.  Return precautions were given.  Patient is well-appearing, stable, and appropriate for discharge at this time.  Final Clinical Impression(s) / ED Diagnoses Final diagnoses:  None    Rx / DC Orders ED Discharge Orders    None       Sherrilee Gilles 07/17/20 0743    Terrilee Files, MD  07/17/20 0826  

## 2020-07-16 NOTE — ED Triage Notes (Signed)
Pt reports right upper flank pain, under her ribcage area, for the past 5 days. Pt states it is worse with movement. Denies any injury or trauma. resp e.u

## 2020-07-16 NOTE — Discharge Instructions (Signed)
You were evaluated in the emergency department today for your right-sided back pain.  Your physical exam and vital signs are very reassuring.  Additionally you were tested for COVID-19, due to your exposure from your partner. You may follow your results in mychart.   The muscles in your right are in what is called spasm, meaning they are inappropriately tightened up.  This can be quite painful.  To help with your pain you may take Tylenol and / or NSAID medication (such as ibuprofen or naproxen) to help with your pain.  Additionally, you have been prescribed a muscle relaxer called Robaxin to help relieve some of the muscle spasm.  Please be advised that this medication may make you very sleepy, so you should not drive or operate heavy machinery while you are taking it.  You may also utilize topical pain relief such as Biofreeze, IcyHot, or topical lidocaine patches.  I also recommend that you apply heat to the area, such as a hot shower or heating pad, and follow heat application with massage of the muscles that are most tight.  Please return to the emergency department if you develop any numbness/tingling/weakness in your arms or legs, any difficulty urinating, or urinary incontinence chest pain, shortness of breath, abdominal pain, nausea or vomiting that does not stop, or any other new severe symptoms.

## 2020-07-17 ENCOUNTER — Other Ambulatory Visit: Payer: Self-pay

## 2020-07-17 ENCOUNTER — Encounter (HOSPITAL_COMMUNITY): Payer: Self-pay | Admitting: Emergency Medicine

## 2020-07-17 ENCOUNTER — Emergency Department (HOSPITAL_COMMUNITY)
Admission: EM | Admit: 2020-07-17 | Discharge: 2020-07-17 | Disposition: A | Discharge status: Home / Self Care | Payer: HRSA Program | Attending: Emergency Medicine | Admitting: Emergency Medicine

## 2020-07-17 DIAGNOSIS — U071 COVID-19: Secondary | ICD-10-CM | POA: Diagnosis not present

## 2020-07-17 DIAGNOSIS — Z5321 Procedure and treatment not carried out due to patient leaving prior to being seen by health care provider: Secondary | ICD-10-CM | POA: Diagnosis not present

## 2020-07-17 DIAGNOSIS — R070 Pain in throat: Secondary | ICD-10-CM | POA: Diagnosis present

## 2020-07-17 HISTORY — DX: COVID-19: U07.1

## 2020-07-17 LAB — RESP PANEL BY RT-PCR (FLU A&B, COVID) ARPGX2
Influenza A by PCR: NEGATIVE
Influenza B by PCR: NEGATIVE
SARS Coronavirus 2 by RT PCR: POSITIVE — AB

## 2020-07-17 NOTE — ED Triage Notes (Signed)
Pt reports headache and "scratchy throat" that started today.  Requesting COVID test.  States she was seen in ED yesterday due to + COVID exposure and test was negative.

## 2020-07-17 NOTE — ED Notes (Signed)
Pt states she does not want to wait any longer. Notified pt that she needed to stay to see a doctor but pt insists on leaving

## 2020-07-18 ENCOUNTER — Encounter (HOSPITAL_BASED_OUTPATIENT_CLINIC_OR_DEPARTMENT_OTHER): Payer: Self-pay | Admitting: Obstetrics & Gynecology

## 2020-07-18 ENCOUNTER — Encounter: Payer: Self-pay | Admitting: Oncology

## 2020-07-18 ENCOUNTER — Telehealth: Payer: Self-pay | Admitting: Oncology

## 2020-07-18 NOTE — Telephone Encounter (Signed)
Re: [facility]  Called to Discuss with patient about Covid symptoms and the use of regeneron, a monoclonal antibody infusion for those with mild to moderate Covid symptoms and at a high risk of hospitalization.     Pt is qualified for this infusion at the [facility] infusion center due to co-morbid conditions and/or a member of an at-risk group.    Past Medical History:  Diagnosis Date  . Asthma   . COVID-19 07/17/2020  . GERD (gastroesophageal reflux disease)   . Hypertension during pregnancy  . Pneumonia    2013    Specific risk condition- asthma, htn, obesity (bmi > 25), unvaccinated.  Onset sx-07/17/20   Unable to reach pt. Left VM and MCM.  Mignon Pine, AGNP-C [phone] (Infusion Center Hotline)

## 2020-07-20 ENCOUNTER — Other Ambulatory Visit: Payer: Self-pay | Admitting: Obstetrics & Gynecology

## 2020-07-27 ENCOUNTER — Ambulatory Visit (HOSPITAL_BASED_OUTPATIENT_CLINIC_OR_DEPARTMENT_OTHER): Admit: 2020-07-27 | Payer: Medicaid Other | Admitting: Obstetrics & Gynecology

## 2020-07-27 ENCOUNTER — Encounter (HOSPITAL_BASED_OUTPATIENT_CLINIC_OR_DEPARTMENT_OTHER): Payer: Self-pay

## 2020-07-27 SURGERY — DILATATION & CURETTAGE/HYSTEROSCOPY WITH NOVASURE ABLATION
Anesthesia: Choice

## 2020-08-11 ENCOUNTER — Encounter: Payer: Self-pay | Admitting: Obstetrics & Gynecology

## 2020-08-11 ENCOUNTER — Telehealth (INDEPENDENT_AMBULATORY_CARE_PROVIDER_SITE_OTHER): Payer: Medicaid Other | Admitting: Obstetrics & Gynecology

## 2020-08-11 DIAGNOSIS — N939 Abnormal uterine and vaginal bleeding, unspecified: Secondary | ICD-10-CM

## 2020-08-11 DIAGNOSIS — Z03818 Encounter for observation for suspected exposure to other biological agents ruled out: Secondary | ICD-10-CM | POA: Diagnosis not present

## 2020-08-11 DIAGNOSIS — N946 Dysmenorrhea, unspecified: Secondary | ICD-10-CM

## 2020-08-11 NOTE — Progress Notes (Signed)
TELEHEALTH GYNECOLOGY VISIT ENCOUNTER NOTE  Provider location: [facility]   I connected with Ruth [name] on 08/11/20 at  3:55 PM EST by telephone at home and verified that I am speaking with the correct person using two identifiers. Patient was unable to do MyChart audiovisual encounter due to technical difficulties, she tried several times.    I discussed the limitations, risks, security and privacy concerns of performing an evaluation and management service by telephone and the availability of in person appointments. I also discussed with the patient that there may be a patient responsible charge related to this service. The patient expressed understanding and agreed to proceed.   History:  Ruth [name] is a 40 y.o. [phone] female being evaluated today for heavy menstrual periods.She needs to reschedule endometrial ablation which was cancelled this month due to diagnosis of Covid 19 She denies any abnormal vaginal discharge, bleeding, pelvic pain or other concerns.       Past Medical History:  Diagnosis Date  . Asthma   . COVID-19 07/17/2020  . GERD (gastroesophageal reflux disease)   . Hypertension during pregnancy  . Pneumonia    2013   Past Surgical History:  Procedure Laterality Date  . CHOLECYSTECTOMY  10/31/2011   Procedure: LAPAROSCOPIC CHOLECYSTECTOMY;  Surgeon: Liz Malady, MD;  Location: [facility] OR;  Service: General;  Laterality: N/A;  . DILATION AND CURETTAGE OF UTERUS    . KNEE SURGERY Left 08/2014  . LAPAROSCOPIC TUBAL LIGATION Bilateral 07/24/2016   Procedure: LAPAROSCOPIC TUBAL LIGATION;  Surgeon: Adam Phenix, MD;  Location: [facility] ORS;  Service: Gynecology;  Laterality: Bilateral;  . WISDOM TOOTH EXTRACTION     The following portions of the patient's history were reviewed and updated as appropriate: allergies, current medications, past family history, past medical history, past social history, past surgical history and  problem list.   Health Maintenance:  Normal pap and negative HRHPV on 05/2018. Marland Kitchen   Review of Systems:  Pertinent items noted in HPI and remainder of comprehensive ROS otherwise negative.  Physical Exam:   Telephone visit      Physical exam deferred due to nature of the encounter  Labs and Imaging No results found for this or any previous visit (from the past 336 hour(s)). DG Ribs Unilateral W/Chest Right  Result Date: 07/16/2020 CLINICAL DATA:  Right rib pain EXAM: RIGHT RIBS AND CHEST - 3+ VIEW COMPARISON:  12/25/2019 FINDINGS: No fracture or other bone lesions are seen involving the ribs. There is no evidence of pneumothorax or pleural effusion. Both lungs are clear. Heart size and mediastinal contours are within normal limits. IMPRESSION: Negative. Electronically Signed   By: Duanne Guess D.O.   On: 07/16/2020 13:01      Assessment and Plan:     Dysmenorrhea  Abnormal uterine bleeding (AUB)        I discussed the assessment and treatment plan with the patient. The patient was provided an opportunity to ask questions and all were answered. The patient agreed with the plan and demonstrated an understanding of the instructions.   The patient was advised to call back or seek an in-person evaluation/go to the ED if the symptoms worsen or if the condition fails to improve as anticipated.  I provided 10 minutes of non-face-to-face time during this encounter. Patient desires surgical management with endometrial ablation.  The risks of surgery were discussed in detail with the patient including but not limited to: bleeding which may require  transfusion or reoperation; infection which may require prolonged hospitalization or re-hospitalization and antibiotic therapy; injury to bowel, bladder, ureters and major vessels or other surrounding organs; formation of adhesions; need for additional procedures including laparotomy or subsequent procedures secondary to abnormal pathology;  thromboembolic phenomenon; incisional problems and other postoperative or anesthesia complications.  Patient was told that the likelihood that her condition and symptoms will be treated effectively with this surgical management was very high; the postoperative expectations were also discussed in detail. The patient also understands the alternative treatment options which were discussed in full. All questions were answered.  She was told that she will be contacted by our surgical scheduler regarding the time and date of her surgery; routine preoperative instructions will be given to her by the preoperative nursing team.   She is aware of need for preoperative COVID testing and subsequent quarantine from time of test to time of surgery; she will be given further preoperative instructions at that COVID screening visit.  Printed patient education handouts about the procedure were given to the patient to review at home.  Scheryl Darter, MD [facility] for [facility], [facility]

## 2020-08-11 NOTE — Patient Instructions (Signed)
Endometrial Ablation Endometrial ablation is a procedure that destroys the thin inner layer of the lining of the uterus (endometrium). This procedure may be done:  To stop heavy menstrual periods.  To stop bleeding that is causing anemia.  To control irregular bleeding.  To treat bleeding caused by small tumors (fibroids) in the endometrium. This procedure is often done as an alternative to major surgery, such as removal of the uterus and cervix (hysterectomy). As a result of this procedure:  You may not be able to have children. However, if you have not yet gone through menopause: ? You may still have a small chance of getting pregnant. ? You will need to use a reliable method of birth control after the procedure to prevent pregnancy.  You may stop having a menstrual period, or you may have only a small amount of bleeding during your period. Menstruation may return several years after the procedure. Tell a health care provider about:  Any allergies you have.  All medicines you are taking, including vitamins, herbs, eye drops, creams, and over-the-counter medicines.  Any problems you or family members have had with the use of anesthetic medicines.  Any blood disorders you have.  Any surgeries you have had.  Any medical conditions you have.  Whether you are pregnant or may be pregnant. What are the risks? Generally, this is a safe procedure. However, problems may occur, including:  A hole (perforation) in the uterus or bowel.  Infection in the uterus, bladder, or vagina.  Bleeding.  Allergic reaction to medicines.  Damage to nearby structures or organs.  An air bubble in the lung (air embolus).  Problems with pregnancy.  Failure of the procedure.  Decreased ability to diagnose cancer in the endometrium. Scar tissue forms after the procedure, making it more difficult to get a sample of the uterine lining. What happens before the procedure? Medicines Ask your health  care provider about:  Changing or stopping your regular medicines. This is especially important if you take diabetes medicines or blood thinners.  Taking medicines such as aspirin and ibuprofen. These medicines can thin your blood. Do not take these medicines before your procedure if your doctor tells you not to take them.  Taking over-the-counter medicines, vitamins, herbs, and supplements. Tests  You will have tests of your endometrium to make sure there are no precancerous cells or cancer cells present.  You may have an ultrasound of the uterus. General instructions  Do not use any products that contain nicotine or tobacco for at least 4 weeks before the procedure. These include cigarettes, chewing tobacco, and vaping devices, such as e-cigarettes. If you need help quitting, ask your health care provider.  You may be given medicines to thin the endometrium.  Ask your health care provider what steps will be taken to help prevent infection. These steps may include: ? Removing hair at the surgery site. ? Washing skin with a germ-killing soap. ? Taking antibiotic medicine.  Plan to have a responsible adult take you home from the hospital or clinic.  Plan to have a responsible adult care for you for the time you are told after you leave the hospital or clinic. This is important. What happens during the procedure?  You will lie on an exam table with your feet and legs supported as in a pelvic exam.  An IV will be inserted into one of your veins.  You will be given a medicine to help you relax (sedative).  A surgical tool with   a light and camera (resectoscope) will be inserted into your vagina and moved into your uterus. This allows your surgeon to see inside your uterus.  Endometrial tissue will be destroyed and removed, using one of the following methods: ? Radiofrequency. This uses an electrical current to destroy the endometrium. ? Cryotherapy. This uses extreme cold to freeze  the endometrium. ? Heated fluid. This uses a heated salt and water (saline) solution to destroy the endometrium. ? Microwave. This uses high-energy microwaves to heat up the endometrium and destroy it. ? Thermal balloon. This involves inserting a catheter with a balloon tip into the uterus. The balloon tip is filled with heated fluid to destroy the endometrium. The procedure may vary among health care providers and hospitals.   What happens after the procedure?  Your blood pressure, heart rate, breathing rate, and blood oxygen level will be monitored until you leave the hospital or clinic.  You may have vaginal bleeding for 4-6 weeks after the procedure. You may also have: ? Cramps. ? A thin, watery vaginal discharge that is light pink or Limbert. ? A need to urinate more than usual. ? Nausea.  If you were given a sedative during the procedure, it can affect you for several hours. Do not drive or operate machinery until your health care provider says that it is safe.  Do not have sex or insert anything into your vagina until your health care provider says it is safe. Summary  Endometrial ablation is done to treat many causes of heavy menstrual bleeding. The procedure destroys the thin inner layer of the lining of the uterus (endometrium).  This procedure is often done as an alternative to major surgery, such as removal of the uterus and cervix (hysterectomy).  Plan to have a responsible adult take you home from the hospital or clinic. This information is not intended to replace advice given to you by your health care provider. Make sure you discuss any questions you have with your health care provider. Document Revised: 01/28/2020 Document Reviewed: 01/28/2020 Elsevier Patient Education  2021 [employer]vier Inc.       

## 2020-08-11 NOTE — Progress Notes (Signed)
I connected with  Ruth [name] on 08/11/20 at  3:55 PM EST by telephone and verified that I am speaking with the correct person using two identifiers.   I discussed the limitations, risks, security and privacy concerns of performing an evaluation and management service by telephone and the availability of in person appointments. I also discussed with the patient that there may be a patient responsible charge related to this service. The patient expressed understanding and agreed to proceed.  Isabell Jarvis, RN 08/11/2020  3:14 PM    Pt unable to take Blood pressure. States "needs new batteries"

## 2020-08-19 ENCOUNTER — Other Ambulatory Visit: Payer: Self-pay | Admitting: Obstetrics & Gynecology

## 2020-09-08 ENCOUNTER — Other Ambulatory Visit: Payer: Self-pay

## 2020-09-08 ENCOUNTER — Other Ambulatory Visit (HOSPITAL_COMMUNITY)
Admission: RE | Admit: 2020-09-08 | Discharge: 2020-09-08 | Disposition: A | Discharge status: Home / Self Care | Payer: Medicaid Other | Source: Ambulatory Visit | Attending: Family Medicine | Admitting: Family Medicine

## 2020-09-08 ENCOUNTER — Encounter: Payer: Self-pay | Admitting: Family Medicine

## 2020-09-08 ENCOUNTER — Ambulatory Visit (INDEPENDENT_AMBULATORY_CARE_PROVIDER_SITE_OTHER): Payer: Self-pay | Admitting: Family Medicine

## 2020-09-08 VITALS — BP 112/70 | HR 76 | Wt 208.0 lb

## 2020-09-08 DIAGNOSIS — B9689 Other specified bacterial agents as the cause of diseases classified elsewhere: Secondary | ICD-10-CM

## 2020-09-08 DIAGNOSIS — N898 Other specified noninflammatory disorders of vagina: Secondary | ICD-10-CM | POA: Insufficient documentation

## 2020-09-08 DIAGNOSIS — N760 Acute vaginitis: Secondary | ICD-10-CM

## 2020-09-08 LAB — POCT WET PREP (WET MOUNT)
Clue Cells Wet Prep Whiff POC: POSITIVE
Trichomonas Wet Prep HPF POC: ABSENT

## 2020-09-08 MED ORDER — METRONIDAZOLE 500 MG PO TABS: 500.0000 mg | ORAL_TABLET | Freq: Two times a day (BID) | ORAL | 0 refills | Status: AC

## 2020-09-08 MED ORDER — FLUCONAZOLE 150 MG PO TABS: 150.0000 mg | ORAL_TABLET | Freq: Once | ORAL | 0 refills | Status: AC

## 2020-09-08 NOTE — Progress Notes (Signed)
    SUBJECTIVE:   CHIEF COMPLAINT / HPI:   Vaginal discharge: Patient reports several days of vaginal discharge with fishy odor.  Denies any vaginal itch.  Denies any pain in her pelvis, urinary frequency, dysuria.  She has a history of bacterial vaginosis  PERTINENT  PMH / PSH:  Hx HPV, AUB, BV, Dysmenorrhea, BMI 33, Asthma  OBJECTIVE:   BP 112/70   Pulse 76   Wt 208 lb (94.3 kg)   LMP 08/21/2020   SpO2 99%   BMI 33.57 kg/m    Physical exam: General: Well-appearing patient, no apparent distress Respiratory: Comfortable work of breathing, speaking complete sentences GU: Normal-appearing labia minora and majora without lesion, healthy appearing vaginal rugae with scant vaginal discharge, closed cervical os without lesion, no discharge appreciated   ASSESSMENT/PLAN:   Bacterial vaginosis Wet prep performed at today's office visit positive for bacterial vaginosis. -Patient called and informed of results, Flagyl 500 mg twice daily x7 days sent to patient's pharmacy [facility] at [facility] -We will follow-up surgical vaginal ancillary testing for GC/chlamydia/trichomonas     Dollene Cleveland, DO [facility]

## 2020-09-08 NOTE — Assessment & Plan Note (Signed)
Wet prep performed at today's office visit positive for bacterial vaginosis. -Patient called and informed of results, Flagyl 500 mg twice daily x7 days sent to patient's pharmacy [facility] at [facility] -We will follow-up surgical vaginal ancillary testing for GC/chlamydia/trichomonas

## 2020-09-09 LAB — CERVICOVAGINAL ANCILLARY ONLY
Chlamydia: NEGATIVE
Comment: NEGATIVE
Comment: NORMAL
Neisseria Gonorrhea: NEGATIVE

## 2020-09-13 ENCOUNTER — Encounter (HOSPITAL_BASED_OUTPATIENT_CLINIC_OR_DEPARTMENT_OTHER): Payer: Self-pay | Admitting: Obstetrics & Gynecology

## 2020-09-13 ENCOUNTER — Other Ambulatory Visit: Payer: Self-pay

## 2020-09-19 NOTE — Progress Notes (Signed)

## 2020-09-21 ENCOUNTER — Encounter (HOSPITAL_BASED_OUTPATIENT_CLINIC_OR_DEPARTMENT_OTHER)
Admission: RE | Disposition: A | Discharge status: Home / Self Care | Payer: Self-pay | Source: Home / Self Care | Attending: Obstetrics & Gynecology

## 2020-09-21 ENCOUNTER — Encounter (HOSPITAL_BASED_OUTPATIENT_CLINIC_OR_DEPARTMENT_OTHER): Payer: Self-pay | Admitting: Obstetrics & Gynecology

## 2020-09-21 ENCOUNTER — Ambulatory Visit (HOSPITAL_BASED_OUTPATIENT_CLINIC_OR_DEPARTMENT_OTHER): Payer: Medicaid Other | Admitting: Anesthesiology

## 2020-09-21 ENCOUNTER — Other Ambulatory Visit: Payer: Self-pay

## 2020-09-21 ENCOUNTER — Ambulatory Visit (HOSPITAL_BASED_OUTPATIENT_CLINIC_OR_DEPARTMENT_OTHER)
Admission: RE | Admit: 2020-09-21 | Discharge: 2020-09-21 | Disposition: A | Discharge status: Home / Self Care | Payer: Medicaid Other | Attending: Obstetrics & Gynecology | Admitting: Obstetrics & Gynecology

## 2020-09-21 DIAGNOSIS — J4520 Mild intermittent asthma, uncomplicated: Secondary | ICD-10-CM | POA: Diagnosis not present

## 2020-09-21 DIAGNOSIS — Z8616 Personal history of COVID-19: Secondary | ICD-10-CM | POA: Insufficient documentation

## 2020-09-21 DIAGNOSIS — Z885 Allergy status to narcotic agent status: Secondary | ICD-10-CM | POA: Diagnosis not present

## 2020-09-21 DIAGNOSIS — Z809 Family history of malignant neoplasm, unspecified: Secondary | ICD-10-CM | POA: Insufficient documentation

## 2020-09-21 DIAGNOSIS — N939 Abnormal uterine and vaginal bleeding, unspecified: Secondary | ICD-10-CM | POA: Diagnosis not present

## 2020-09-21 DIAGNOSIS — H6120 Impacted cerumen, unspecified ear: Secondary | ICD-10-CM | POA: Diagnosis not present

## 2020-09-21 DIAGNOSIS — Z87891 Personal history of nicotine dependence: Secondary | ICD-10-CM | POA: Diagnosis not present

## 2020-09-21 DIAGNOSIS — N946 Dysmenorrhea, unspecified: Secondary | ICD-10-CM

## 2020-09-21 DIAGNOSIS — N926 Irregular menstruation, unspecified: Secondary | ICD-10-CM | POA: Diagnosis not present

## 2020-09-21 DIAGNOSIS — Z8249 Family history of ischemic heart disease and other diseases of the circulatory system: Secondary | ICD-10-CM | POA: Insufficient documentation

## 2020-09-21 DIAGNOSIS — K219 Gastro-esophageal reflux disease without esophagitis: Secondary | ICD-10-CM | POA: Diagnosis not present

## 2020-09-21 HISTORY — PX: ENDOMETRIAL ABLATION: SHX621

## 2020-09-21 LAB — POCT PREGNANCY, URINE: Preg Test, Ur: NEGATIVE

## 2020-09-21 SURGERY — ABLATION, ENDOMETRIUM
Anesthesia: General | Site: Uterus

## 2020-09-21 MED ORDER — SCOPOLAMINE 1 MG/3DAYS TD PT72
MEDICATED_PATCH | TRANSDERMAL | Status: AC
  Filled 2020-09-21: qty 1

## 2020-09-21 MED ORDER — MIDAZOLAM HCL 5 MG/5ML IJ SOLN
INTRAMUSCULAR | Status: DC | PRN
  Administered 2020-09-21: 2 mg via INTRAVENOUS

## 2020-09-21 MED ORDER — BUPIVACAINE HCL (PF) 0.25 % IJ SOLN
INTRAMUSCULAR | Status: AC
  Filled 2020-09-21: qty 30

## 2020-09-21 MED ORDER — BUPIVACAINE HCL 0.25 % IJ SOLN
INTRAMUSCULAR | Status: DC | PRN
  Administered 2020-09-21: 10 mL

## 2020-09-21 MED ORDER — LIDOCAINE 2% (20 MG/ML) 5 ML SYRINGE
INTRAMUSCULAR | Status: DC | PRN
  Administered 2020-09-21: 60 mg via INTRAVENOUS

## 2020-09-21 MED ORDER — PROPOFOL 10 MG/ML IV BOLUS
INTRAVENOUS | Status: AC
  Filled 2020-09-21: qty 20

## 2020-09-21 MED ORDER — MIDAZOLAM HCL 2 MG/2ML IJ SOLN
INTRAMUSCULAR | Status: AC
  Filled 2020-09-21: qty 2

## 2020-09-21 MED ORDER — AMISULPRIDE (ANTIEMETIC) 5 MG/2ML IV SOLN: 10.0000 mg | Freq: Once | INTRAVENOUS | Status: DC | PRN

## 2020-09-21 MED ORDER — FENTANYL CITRATE (PF) 100 MCG/2ML IJ SOLN
25.0000 ug | INTRAMUSCULAR | Status: DC | PRN
Start: 2020-09-21 — End: 2020-09-21
  Administered 2020-09-21: 25 ug via INTRAVENOUS

## 2020-09-21 MED ORDER — FENTANYL CITRATE (PF) 100 MCG/2ML IJ SOLN
INTRAMUSCULAR | Status: DC | PRN
  Administered 2020-09-21 (×2): 50 ug via INTRAVENOUS

## 2020-09-21 MED ORDER — IBUPROFEN 600 MG PO TABS: 600.0000 mg | ORAL_TABLET | Freq: Four times a day (QID) | ORAL | 1 refills | Status: DC | PRN

## 2020-09-21 MED ORDER — KETOROLAC TROMETHAMINE 30 MG/ML IJ SOLN
INTRAMUSCULAR | Status: DC | PRN
  Administered 2020-09-21: 30 mg via INTRAVENOUS

## 2020-09-21 MED ORDER — FENTANYL CITRATE (PF) 100 MCG/2ML IJ SOLN
INTRAMUSCULAR | Status: AC
  Filled 2020-09-21: qty 2

## 2020-09-21 MED ORDER — TRAMADOL HCL 50 MG PO TABS
ORAL_TABLET | ORAL | Status: AC
  Filled 2020-09-21: qty 1

## 2020-09-21 MED ORDER — TRAMADOL HCL 50 MG PO TABS
50.0000 mg | ORAL_TABLET | Freq: Once | ORAL | Status: AC
Start: 2020-09-21 — End: 2020-09-21
  Administered 2020-09-21: 50 mg via ORAL

## 2020-09-21 MED ORDER — ONDANSETRON HCL 4 MG/2ML IJ SOLN
INTRAMUSCULAR | Status: DC | PRN
  Administered 2020-09-21: 4 mg via INTRAVENOUS

## 2020-09-21 MED ORDER — ONDANSETRON HCL 4 MG/2ML IJ SOLN
INTRAMUSCULAR | Status: AC
  Filled 2020-09-21: qty 2

## 2020-09-21 MED ORDER — ACETAMINOPHEN 500 MG PO TABS
ORAL_TABLET | ORAL | Status: AC
  Filled 2020-09-21: qty 2

## 2020-09-21 MED ORDER — POVIDONE-IODINE 10 % EX SWAB
2.0000 "application " | Freq: Once | CUTANEOUS | Status: AC
  Administered 2020-09-21: 2 via TOPICAL

## 2020-09-21 MED ORDER — LACTATED RINGERS IV SOLN: INTRAVENOUS | Status: DC

## 2020-09-21 MED ORDER — ACETAMINOPHEN 500 MG PO TABS
1000.0000 mg | ORAL_TABLET | Freq: Once | ORAL | Status: AC
  Administered 2020-09-21: 1000 mg via ORAL

## 2020-09-21 MED ORDER — DEXAMETHASONE SODIUM PHOSPHATE 4 MG/ML IJ SOLN
INTRAMUSCULAR | Status: DC | PRN
  Administered 2020-09-21: 10 mg via INTRAVENOUS

## 2020-09-21 MED ORDER — SILVER NITRATE-POT NITRATE 75-25 % EX MISC
CUTANEOUS | Status: AC
  Filled 2020-09-21: qty 30

## 2020-09-21 MED ORDER — SCOPOLAMINE 1 MG/3DAYS TD PT72
1.0000 | MEDICATED_PATCH | TRANSDERMAL | Status: DC
  Administered 2020-09-21: 1.5 mg via TRANSDERMAL

## 2020-09-21 MED ORDER — PROPOFOL 10 MG/ML IV BOLUS
INTRAVENOUS | Status: DC | PRN
  Administered 2020-09-21: 200 mg via INTRAVENOUS

## 2020-09-21 SURGICAL SUPPLY — 12 items
ABLATOR SURESOUND NOVASURE (ABLATOR) ×3 IMPLANT
CATH ROBINSON RED A/P 16FR (CATHETERS) IMPLANT
GAUZE 4X4 16PLY RFD (DISPOSABLE) ×3 IMPLANT
GLOVE SURG ENC MOIS LTX SZ6.5 (GLOVE) ×3 IMPLANT
GLOVE SURG UNDER POLY LF SZ7 (GLOVE) ×6 IMPLANT
GOWN STRL REUS W/TWL LRG LVL3 (GOWN DISPOSABLE) ×6 IMPLANT
KIT PROCEDURE FLUENT (KITS) IMPLANT
PACK VAGINAL MINOR WOMEN LF (CUSTOM PROCEDURE TRAY) ×3 IMPLANT
PAD OB MATERNITY 4.3X12.25 (PERSONAL CARE ITEMS) ×3 IMPLANT
PAD PREP 24X48 CUFFED NSTRL (MISCELLANEOUS) ×3 IMPLANT
SLEEVE SCD COMPRESS KNEE MED (STOCKING) ×3 IMPLANT
TOWEL GREEN STERILE FF (TOWEL DISPOSABLE) ×3 IMPLANT

## 2020-09-21 NOTE — Op Note (Signed)
PREOPERATIVE DIAGNOSIS:  Irregular uterine bleeding POSTOPERATIVE DIAGNOSIS: The same PROCEDURE:   NovaSure Endometrial Ablation SURGEON:  Adam Phenix, MD  INDICATIONS: 40 y.o. [id number]  here for NovaSure Endometrial Ablation.  Risks of surgery were discussed with the patient including but not limited to: bleeding which may require transfusion; infection which may require antibiotics; injury to uterus leading to risk of injury to surrounding intraperitoneal organs, need for additional procedures including laparoscopy or laparotomy, and other postoperative/anesthesia complications. Written informed consent was obtained.   FINDINGS:  A 4-6 week size uterus. ANESTHESIA:   General and paracervical block with 10 ml of 0.5% Marcaine INTRAVENOUS FLUIDS:  500 ml of LR ESTIMATED BLOOD LOSS:  Less than 20 ml SPECIMENS: none COMPLICATIONS:  None immediate  PROCEDURE DETAILS:  The patient was taken to the operating room where general anesthesia was administered and was found to be adequate.  After an adequate timeout was performed, she was placed in the dorsal lithotomy position and examined; then prepped and draped in the sterile manner. A speculum was then placed in the patient's vagina and a single tooth tenaculum was applied to the anterior lip of the cervix.  A paracervical block using 0.5% Marcaine was administered. The sound was used to obtain the cervical and uterine cavity length measurements at 4 cm and 4 cm respectively; total sounding length 8 cm. The cervix accommodated the NovaSure device.  The NovaSure device was inserted, and a cavity width of 4.4 cm was determined. Using a power of 97 watts, for 71 sec, the endometrial ablation was performed.The tenaculum was removed from the anterior lip of the cervix, and the vaginal speculum was removed after noting good hemostasis.  The patient tolerated the procedure well and was taken to the recovery area awake, extubated and in stable condition.  The  patient will be discharged to home as per PACU criteria.  Routine postoperative instructions given.  She was prescribed Percocet, Ibuprofen. She will follow up in the clinic in 3 weeks for postoperative evaluation.   Adam Phenix, MD 09/21/2020 1:58 PM

## 2020-09-21 NOTE — Transfer of Care (Signed)
Immediate Anesthesia Transfer of Care Note  Patient: Emyah Roznowski  Procedure(s) Performed: DILATATION & CURETTAGE/HYSTEROSCOPY WITH NOVASURE ABLATION (N/A )  Patient Location: PACU  Anesthesia Type:General  Level of Consciousness: drowsy  Airway & Oxygen Therapy: Patient Spontanous Breathing and Patient connected to face mask oxygen  Post-op Assessment: Report given to RN and Post -op Vital signs reviewed and stable  Post vital signs: Reviewed and stable  Last Vitals:  Vitals Value Taken Time  BP    Temp    Pulse 51 09/21/20 1355  Resp 15 09/21/20 1355  SpO2 100 % 09/21/20 1355  Vitals shown include unvalidated device data.  Last Pain:  Vitals:   09/21/20 1131  TempSrc: Oral  PainSc: 0-No pain         Complications: No complications documented.

## 2020-09-21 NOTE — Anesthesia Postprocedure Evaluation (Signed)
Anesthesia Post Note  Patient: Ruth [name]  Procedure(s) Performed: ENDOMETRIAL ABLATION WITH NOVASURE (N/A Uterus)     Patient location during evaluation: PACU Anesthesia Type: General Level of consciousness: awake and alert Pain management: pain level controlled Vital Signs Assessment: post-procedure vital signs reviewed and stable Respiratory status: spontaneous breathing, nonlabored ventilation, respiratory function stable and patient connected to nasal cannula oxygen Cardiovascular status: blood pressure returned to baseline and stable Postop Assessment: no apparent nausea or vomiting Anesthetic complications: no   No complications documented.  Last Vitals:  Vitals:   09/21/20 1430 09/21/20 1457  BP: (!) 134/94 135/75  Pulse: (!) 51 99  Resp: 13 16  Temp:  36.6 C  SpO2: 98% 100%    Last Pain:  Vitals:   09/21/20 1501  TempSrc:   PainSc: 4                  Kennieth Rad

## 2020-09-21 NOTE — Anesthesia Preprocedure Evaluation (Signed)
Anesthesia Evaluation  Patient identified by MRN, date of birth, ID band Patient awake    Reviewed: Allergy & Precautions, NPO status , Patient's Chart, lab work & pertinent test results  Airway Mallampati: II  TM Distance: >3 FB Neck ROM: Full    Dental  (+) Dental Advisory Given   Pulmonary asthma , former smoker,    breath sounds clear to auscultation       Cardiovascular hypertension, Pt. on medications  Rhythm:Regular Rate:Normal     Neuro/Psych negative neurological ROS     GI/Hepatic Neg liver ROS, GERD  ,  Endo/Other  negative endocrine ROS  Renal/GU negative Renal ROS     Musculoskeletal   Abdominal   Peds  Hematology negative hematology ROS (+)   Anesthesia Other Findings   Reproductive/Obstetrics                             Anesthesia Physical Anesthesia Plan  ASA: II  Anesthesia Plan: General   Post-op Pain Management:    Induction: Intravenous  PONV Risk Score and Plan: 4 or greater and Ondansetron, Dexamethasone, Midazolam, Scopolamine patch - Pre-op and Treatment may vary due to age or medical condition  Airway Management Planned: LMA  Additional Equipment:   Intra-op Plan:   Post-operative Plan: Extubation in OR  Informed Consent: I have reviewed the patients History and Physical, chart, labs and discussed the procedure including the risks, benefits and alternatives for the proposed anesthesia with the patient or authorized representative who has indicated his/her understanding and acceptance.     Dental advisory given  Plan Discussed with: CRNA  Anesthesia Plan Comments:         Anesthesia Quick Evaluation

## 2020-09-21 NOTE — Anesthesia Procedure Notes (Signed)
Procedure Name: LMA Insertion Performed by: Amel Gianino S, CRNA Pre-anesthesia Checklist: Patient identified, Emergency Drugs available, Suction available and Patient being monitored Patient Re-evaluated:Patient Re-evaluated prior to induction Oxygen Delivery Method: Circle System Utilized Preoxygenation: Pre-oxygenation with 100% oxygen Induction Type: IV induction Ventilation: Mask ventilation without difficulty LMA: LMA inserted LMA Size: 4.0 Number of attempts: 1 Airway Equipment and Method: Bite block Placement Confirmation: positive ETCO2 Tube secured with: Tape Dental Injury: Teeth and Oropharynx as per pre-operative assessment  Comments: Eyes taped prior to insertion       

## 2020-09-21 NOTE — H&P (Signed)
Ruth [name] is an 40 y.o. female. [id number] Patient's last menstrual period was 09/19/2020. Patient presents for scheduled NovaSure endometrial ablation for AUB. She had normal endometrial sampling and pap was normal 2019. S/P BTL. Questions were answered and consent was obtained  Pertinent Gynecological History: Menses: AUB Bleeding: dysfunctional uterine bleeding Contraception: tubal ligation   Menstrual History:  Patient's last menstrual period was 09/19/2020.    Past Medical History:  Diagnosis Date  . Asthma   . COVID-19 07/17/2020  . GERD (gastroesophageal reflux disease)   . Hypertension during pregnancy  . Pneumonia    2013    Past Surgical History:  Procedure Laterality Date  . CHOLECYSTECTOMY  10/31/2011   Procedure: LAPAROSCOPIC CHOLECYSTECTOMY;  Surgeon: Liz Malady, MD;  Location: [facility] OR;  Service: General;  Laterality: N/A;  . DILATION AND CURETTAGE OF UTERUS    . KNEE SURGERY Left 08/2014  . LAPAROSCOPIC TUBAL LIGATION Bilateral 07/24/2016   Procedure: LAPAROSCOPIC TUBAL LIGATION;  Surgeon: Adam Phenix, MD;  Location: [facility] ORS;  Service: Gynecology;  Laterality: Bilateral;  . WISDOM TOOTH EXTRACTION      Family History  Problem Relation Age of Onset  . Drug abuse Mother   . Congestive Heart Failure Mother   . Cancer Father     Social History:  reports that she has quit smoking. Her smoking use included cigarettes and cigars. She smoked 0.20 packs per day. She has never used smokeless tobacco. She reports current alcohol use of about 1.0 standard drink of alcohol per week. She reports current drug use. Drug: Marijuana.  Allergies:  Allergies  Allergen Reactions  . Hydrocodone-Acetaminophen Nausea And Vomiting    No medications prior to admission.    Review of Systems  Constitutional: Negative.   Respiratory: Negative.   Gastrointestinal: Negative.   Genitourinary: Positive for vaginal bleeding. Negative for vaginal discharge.  Neurological:  Negative.     Blood pressure 124/76, pulse 64, temperature 98.4 F (36.9 C), temperature source Oral, resp. rate 16, height 5\' 6"  (1.676 m), weight 98 kg, last menstrual period 09/19/2020, SpO2 100 %. Physical Exam Vitals and nursing note reviewed.  Constitutional:      Appearance: She is not ill-appearing.  HENT:     Head: Normocephalic.  Cardiovascular:     Rate and Rhythm: Normal rate.  Pulmonary:     Effort: Pulmonary effort is normal.  Abdominal:     General: There is no distension.  Musculoskeletal:        General: Normal range of motion.  Neurological:     General: No focal deficit present.     Mental Status: She is alert.  Psychiatric:        Mood and Affect: Mood normal.        Behavior: Behavior normal.     Results for orders placed or performed during the hospital encounter of 09/21/20 (from the past 24 hour(s))  Pregnancy, urine POC     Status: None   Collection Time: 09/21/20 11:21 AM  Result Value Ref Range   Preg Test, Ur NEGATIVE NEGATIVE   CBC    Component Value Date/Time   WBC 5.8 07/14/2020 0411   RBC 4.57 07/14/2020 0411   HGB 12.8 07/14/2020 0411   HCT 40.7 07/14/2020 0411   PLT 324 07/14/2020 0411   MCV 89.1 07/14/2020 0411   MCH 28.0 07/14/2020 0411   MCHC 31.4 07/14/2020 0411   RDW 13.2 07/14/2020 0411   LYMPHSABS 2.5 10/30/2011 2205   MONOABS 1.0 10/30/2011  2205   EOSABS 0.1 10/30/2011 2205   BASOSABS 0.0 10/30/2011 2205    Assessment/Plan: Patient desires surgical management with endometrial ablation. NovaSure is planned. The risks of surgery were discussed in detail with the patient including but not limited to: bleeding which may require transfusion or reoperation; infection which may require prolonged hospitalization or re-hospitalization and antibiotic therapy; injury to bowel, bladder, ureters and major vessels or other surrounding organs; formation of adhesions;need for additional procedures including laparotomy or subsequent  procedures secondary to abnormal pathology; thromboembolic phenomenon; incisional problems and other postoperative or anesthesia complications.  Patient was told that the likelihood that her condition and symptoms will be treated effectively with this surgical management was very high; the postoperative expectations were also discussed in detail. The patient also understands the alternative treatment options which were discussed in full. All questions were answered.   Scheryl Darter 09/21/2020, 12:46 PM

## 2020-09-21 NOTE — Discharge Instructions (Signed)
Endometrial Ablation, Care After The following information offers guidance on how to care for yourself after your procedure. Your health care provider may also give you more specific instructions. If you have problems or questions, contact your health care provider. What can I expect after the procedure? After the procedure, it is common to have:  A need to urinate more often than usual for the first 24 hours.  Cramps that feel like menstrual cramps. These may last for 1-2 days.  A thin, watery vaginal discharge that is light pink or Solorzano. This may last for a few weeks. Discharge will be heavy for the first few days after your procedure. You may need to wear a sanitary pad.  Nausea.  Vaginal bleeding for 4-6 weeks after the procedure, as tissue healing occurs. Follow these instructions at home: Medicines  Take over-the-counter and prescription medicines only as told by your health care provider.  If you were prescribed an antibiotic medicine, take it as told by your health care provider. Do not stop taking the antibiotic even if you start to feel better.  Ask your health care provider if the medicine prescribed to you: ? Requires you to avoid driving or using machinery. ? Can cause constipation. You may need to take these actions to prevent or treat constipation:  Drink enough fluid to keep your urine pale yellow.  Take over-the-counter or prescription medicines.  Eat foods that are high in fiber, such as beans, whole grains, and fresh fruits and vegetables.  Limit foods that are high in fat and processed sugars, such as fried or sweet foods.   Activity  If you were given a sedative during the procedure, it can affect you for several hours. Do not drive or operate machinery until your health care provider says that it is safe.  Do not have sex or put anything into your vagina until your health care provider says that it is safe.  Do not lift anything that is heavier than 5 lb  (2.3 kg), or the limit that you are told, until your health care provider says that it is safe.  Return to your normal activities as told by your health care provider. Ask your health care provider what activities are safe for you. General instructions  Do not take baths, swim, or use a hot tub until your health care provider says that it is safe. You will be able to take showers.  Check your vaginal area every day for signs of infection. Check for: ? Redness, swelling, or more pain. ? More blood coming from your vagina. ? A bad-smelling discharge.  Keep all follow-up visits. This is important. Contact a health care provider if:  You have vaginal redness, swelling, or more pain.  You have discharge or bleeding from your vagina that is getting worse.  You have a bad-smelling vaginal discharge.  You have a fever or chills.  You have trouble urinating. Get help right away if:  You have heavy, bright red vaginal bleeding that may include blood clots.  You have severe cramps that do not get better with medicine. Summary  After endometrial ablation, it is normal to have a thin, watery vaginal discharge that is light pink or Ozdemir. This may last a few weeks and may be heavier right after the procedure.  Vaginal bleeding is common after the procedure and should get better with time.  Check your vaginal area every day for signs of infection, such as a bad-smelling discharge.  Keep all follow-up   visits. This is important. This information is not intended to replace advice given to you by your health care provider. Make sure you discuss any questions you have with your health care provider. Document Revised: 01/28/2020 Document Reviewed: 01/28/2020 Elsevier Patient Education  2021 [employer] Inc.  Post Anesthesia Home Care Instructions  Activity: Get plenty of rest for the remainder of the day. A responsible individual must stay with you for 24 hours following the procedure.  For  the next 24 hours, DO NOT: -Drive a car -Advertising copywriter -Drink alcoholic beverages -Take any medication unless instructed by your physician -Make any legal decisions or sign important papers.  Meals: Start with liquid foods such as gelatin or soup. Progress to regular foods as tolerated. Avoid greasy, spicy, heavy foods. If nausea and/or vomiting occur, drink only clear liquids until the nausea and/or vomiting subsides. Call your physician if vomiting continues.  Special Instructions/Symptoms: Your throat may feel dry or sore from the anesthesia or the breathing tube placed in your throat during surgery. If this causes discomfort, gargle with warm salt water. The discomfort should disappear within 24 hours.  If you had a scopolamine patch placed behind your ear for the management of post- operative nausea and/or vomiting:  1. The medication in the patch is effective for 72 hours, after which it should be removed.  Wrap patch in a tissue and discard in the trash. Wash hands thoroughly with soap and water. 2. You may remove the patch earlier than 72 hours if you experience unpleasant side effects which may include dry mouth, dizziness or visual disturbances. 3. Avoid touching the patch. Wash your hands with soap and water after contact with the patch.    No Tylenol until 5:45 PM. No Ibuprofen until 7:45 PM.

## 2020-09-22 ENCOUNTER — Encounter (HOSPITAL_BASED_OUTPATIENT_CLINIC_OR_DEPARTMENT_OTHER): Payer: Self-pay | Admitting: Obstetrics & Gynecology

## 2020-10-24 ENCOUNTER — Other Ambulatory Visit: Payer: Self-pay

## 2020-10-24 ENCOUNTER — Encounter: Payer: Self-pay | Admitting: Family Medicine

## 2020-10-24 ENCOUNTER — Encounter: Payer: Self-pay | Admitting: Obstetrics & Gynecology

## 2020-10-24 ENCOUNTER — Ambulatory Visit (INDEPENDENT_AMBULATORY_CARE_PROVIDER_SITE_OTHER): Payer: Medicaid Other | Admitting: Obstetrics & Gynecology

## 2020-10-24 VITALS — BP 114/66 | HR 70 | Ht 66.0 in | Wt 208.0 lb

## 2020-10-24 DIAGNOSIS — N946 Dysmenorrhea, unspecified: Secondary | ICD-10-CM

## 2020-10-24 DIAGNOSIS — Z9889 Other specified postprocedural states: Secondary | ICD-10-CM

## 2020-10-24 DIAGNOSIS — N939 Abnormal uterine and vaginal bleeding, unspecified: Secondary | ICD-10-CM

## 2020-10-24 NOTE — Progress Notes (Signed)
Subjective:     Ruth [name] is a 40 y.o. female who presents to the clinic 4 weeks status post NovaSure ablation for abnormal uterine bleeding. Eating a regular diet without difficulty. Bowel movements are normal. The patient is not having any pain.  The following portions of the patient's history were reviewed and updated as appropriate: allergies, current medications, past family history, past medical history, past social history, past surgical history and problem list.  Review of Systems Genitourinary:positive for vaginal discharge and which is thin, occasional slightly yellow, no odor or itching    Objective:    BP 114/66   Pulse 70   Ht 5\' 6"  (1.676 m)   Wt 208 lb (94.3 kg)   BMI 33.57 kg/m  General:  alert, cooperative and no distress  Abdomen: Not distended   Assessment:    Doing well postoperatively. Operative findings again reviewed. Pathology report discussed.    Plan:    1. Continue any current medications. 2. Wound care discussed. 3. Activity restrictions: none 4. Anticipated return to work: now. 5. Follow up: as needed, recommend start mammography  . Patient ID: Ruth [name], female   DOB: [birth year], 40 y.o.   MRN: [id number]

## 2020-10-24 NOTE — Patient Instructions (Signed)
Mammogram A mammogram is an X-ray of the breasts. This is done to check for changes that are not normal. This test can look for changes that may be caused by breast cancer or other problems. Mammograms are regularly done on women beginning at age 40. A man may have a mammogram if he has a lump or swelling in his breast. Tell a doctor:  About any allergies you have.  If you have breast implants.  If you have had breast disease, biopsy, or surgery.  If you have a family history of breast cancer.  If you are breastfeeding.  Whether you are pregnant or may be pregnant. What are the risks? Generally, this is a safe procedure. But problems may occur, including:  Being exposed to radiation. Radiation levels are very low with this test.  The need for more tests.  The results were not read properly.  Trouble finding breast cancer in women with dense breasts. What happens before the test?  Have this test done about 1-2 weeks after your menstrual period. This is often when your breasts are the least tender.  If you are visiting a new doctor or clinic, have any past mammogram images sent to your new doctor's office.  Wash your breasts and under your arms on the day of the test.  Do not use deodorants, perfumes, lotions, or powders on the day of the test.  Take off any jewelry from your neck.  Wear clothes that you can change into and out of easily. What happens during the test?  You will take off your clothes from the waist up. You will put on a gown.  You will stand in front of the X-ray machine.  Each breast will be placed between two plastic or glass plates. The plates will press down on your breast for a few seconds. Try to relax. This does not cause any harm to your breasts. It may not feel comfortable, but it will be very brief.  X-rays will be taken from different angles of each breast. The procedure may vary among doctors and hospitals.   What can I expect after the  test?  The mammogram will be read by a specialist (radiologist).  You may need to do parts of the test again. This depends on the quality of the images.  You may go back to your normal activities.  It is up to you to get the results of your test. Ask how to get your results when they are ready. Summary  A mammogram is an X-ray of the breasts. It looks for changes that may be caused by breast cancer or other problems.  A man may have this test if he has a lump or swelling in his breast.  Before the test, tell your doctor about any breast problems that you have had in the past.  Have this test done about 1-2 weeks after your menstrual period.  Ask when your test results will be ready. Make sure you get your test results. This information is not intended to replace advice given to you by your health care provider. Make sure you discuss any questions you have with your health care provider. Document Revised: 05/09/2020 Document Reviewed: 05/09/2020 Elsevier Patient Education  2021 Elsevier Inc.  

## 2020-10-26 ENCOUNTER — Other Ambulatory Visit: Payer: Self-pay

## 2020-10-26 ENCOUNTER — Encounter (HOSPITAL_COMMUNITY): Payer: Self-pay

## 2020-10-26 ENCOUNTER — Ambulatory Visit (HOSPITAL_COMMUNITY)
Admission: EM | Admit: 2020-10-26 | Discharge: 2020-10-26 | Disposition: A | Discharge status: Home / Self Care | Payer: Medicaid Other | Attending: Emergency Medicine | Admitting: Emergency Medicine

## 2020-10-26 DIAGNOSIS — H6122 Impacted cerumen, left ear: Secondary | ICD-10-CM

## 2020-10-26 NOTE — ED Provider Notes (Signed)
[facility]    CSN: [id number] Arrival date & time: 10/26/20  1046      History   Chief Complaint Chief Complaint  Patient presents with  . Left ear clogged    HPI Shalinda Burkholder is a 40 y.o. female.   Patient is here for evaluation of left ear pain and clogging.  Reports trying to clean her ear with Q-tip with no relief.  Denies any pain or drainage. Denies any specific alleviating or aggravating factors.  Denies any fevers, chest pain, shortness of breath, N/V/D, numbness, tingling, weakness, abdominal pain, or headaches.   ROS: As per HPI, all other pertinent ROS negative   The history is provided by the patient.    Past Medical History:  Diagnosis Date  . Asthma   . COVID-19 07/17/2020  . GERD (gastroesophageal reflux disease)   . Hypertension    HTN with pregnancy   . Pneumonia    2013    Patient Active Problem List   Diagnosis Date Noted  . Dysmenorrhea 06/08/2020  . Bacterial vaginosis 05/11/2020  . Abnormal uterine bleeding (AUB) 12/25/2019  . Screening for hyperlipidemia 09/16/2019  . Vaginal discharge 03/12/2019  . 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 Health maintenance examination 03/13/2016  . Back pain 03/13/2016  . Left knee injury 06/22/2014  . Obesity (BMI 30-39.9) 06/25/2013  . Contact dermatitis 06/15/2013  . Cerumen impaction 08/22/2012  . Birth control 03/14/2012  . Constipation 11/09/2011  . Left knee pain 07/09/2011  . 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 ASTHMA, INTERMITTENT 02/19/2007    Past Surgical History:  Procedure Laterality Date  . CHOLECYSTECTOMY  10/31/2011   Procedure: LAPAROSCOPIC CHOLECYSTECTOMY;  Surgeon: Liz Malady, MD;  Location: [facility] OR;  Service: General;  Laterality: N/A;  . DILATION AND CURETTAGE OF UTERUS    . ENDOMETRIAL ABLATION N/A 09/21/2020   Procedure: ENDOMETRIAL ABLATION WITH NOVASURE;  Surgeon: Adam Phenix, MD;  Location: [facility];  Service: Gynecology;  Laterality:  N/A;  . KNEE SURGERY Left 08/2014  . LAPAROSCOPIC TUBAL LIGATION Bilateral 07/24/2016   Procedure: LAPAROSCOPIC TUBAL LIGATION;  Surgeon: Adam Phenix, MD;  Location: [facility];  Service: Gynecology;  Laterality: Bilateral;  . WISDOM TOOTH EXTRACTION      OB History    Gravida  6   Para  4   Term  4   Preterm  0   AB  2   Living  4     SAB  2   IAB  0   Ectopic  0   Multiple  0   Live Births  4        Obstetric Comments  Have 4 children, vaginal deliveries          Home Medications    Prior to Admission medications   Not on File    Family History Family History  Problem Relation Age of Onset  . Drug abuse Mother   . Congestive Heart Failure Mother   . Cancer Father     Social History Social History   Tobacco Use  . Smoking status: Former Smoker    Packs/day: 0.20    Types: Cigarettes, Cigars  . Smokeless tobacco: Never Used  Vaping Use  . Vaping Use: Never used  Substance Use Topics  . Alcohol use: Yes    Alcohol/week: 1.0 standard drink    Types: 1 Glasses of wine per week    Comment: socially   . Drug use: Yes  Types: Marijuana    Comment: occassionally      Allergies   Hydrocodone-acetaminophen   Review of Systems Review of Systems   Physical Exam Triage Vital Signs ED Triage Vitals  Enc Vitals Group     BP 10/26/20 1110 134/82     Pulse Rate 10/26/20 1110 81     Resp 10/26/20 1110 19     Temp 10/26/20 1110 97.9 F (36.6 C)     Temp src --      SpO2 10/26/20 1110 100 %     Weight --      Height --      Head Circumference --      Peak Flow --      Pain Score 10/26/20 1108 3     Pain Loc --      Pain Edu? --      Excl. in GC? --    No data found.  Updated Vital Signs BP 134/82   Pulse 81   Temp 97.9 F (36.6 C)   Resp 19   SpO2 100%   Visual Acuity Right Eye Distance:   Left Eye Distance:   Bilateral Distance:    Right Eye Near:   Left Eye Near:    Bilateral Near:     Physical Exam Vitals and  nursing note reviewed.  Constitutional:      General: She is not in acute distress.    Appearance: Normal appearance. She is not ill-appearing, toxic-appearing or diaphoretic.  HENT:     Head: Normocephalic and atraumatic.     Right Ear: Tympanic membrane, ear canal and external ear normal.     Left Ear: There is impacted cerumen.  Eyes:     Conjunctiva/sclera: Conjunctivae normal.  Cardiovascular:     Rate and Rhythm: Normal rate.     Pulses: Normal pulses.  Pulmonary:     Effort: Pulmonary effort is normal.  Abdominal:     General: Abdomen is flat.  Musculoskeletal:        General: Normal range of motion.     Cervical back: Normal range of motion.  Skin:    General: Skin is warm and dry.  Neurological:     General: No focal deficit present.     Mental Status: She is alert and oriented to person, place, and time.  Psychiatric:        Mood and Affect: Mood normal.      UC Treatments / Results  Labs (all labs ordered are listed, but only abnormal results are displayed) Labs Reviewed - No data to display  EKG   Radiology No results found.  Procedures Procedures (including critical care time)  Medications Ordered in UC Medications - No data to display  Initial Impression / Assessment and Plan / UC Course  I have reviewed the triage vital signs and the nursing notes.  Pertinent labs & imaging results that were available during my care of the patient were reviewed by me and considered in my medical decision making (see chart for details).    Impacted cerumen of the left ear Left ear irrigated by staff in office.  After irrigation ear assessed and within normal limits.  Denies any pain Encouraged to not clean ears using Q-tips.  Can use OTC medications and eardrops. Follow-up with primary care as needed  Final Clinical Impressions(s) / UC Diagnoses   Final diagnoses:  Impacted cerumen of left ear     Discharge Instructions     Do not use  q-tips to clean  your ear.   Follow up with your primary care provider as needed.     ED Prescriptions    None     PDMP not reviewed this encounter.   Ivette Loyal, NP 10/26/20 1216

## 2020-10-26 NOTE — ED Triage Notes (Signed)
Pt in with c/o left ear pain. Pt states her ear feels clogged. And her jaw on the right side "feels funny"  Pt has been using ear drops with no relief

## 2020-10-26 NOTE — Discharge Instructions (Addendum)
Do not use q-tips to clean your ear.   Follow up with your primary care provider as needed.

## 2021-05-05 DIAGNOSIS — M545 Low back pain, unspecified: Secondary | ICD-10-CM | POA: Diagnosis not present

## 2021-05-09 ENCOUNTER — Ambulatory Visit (INDEPENDENT_AMBULATORY_CARE_PROVIDER_SITE_OTHER): Payer: Medicaid Other | Admitting: Family Medicine

## 2021-05-09 ENCOUNTER — Other Ambulatory Visit: Payer: Self-pay

## 2021-05-09 ENCOUNTER — Encounter: Payer: Self-pay | Admitting: Family Medicine

## 2021-05-09 DIAGNOSIS — M545 Low back pain, unspecified: Secondary | ICD-10-CM | POA: Diagnosis not present

## 2021-05-09 DIAGNOSIS — Z9889 Other specified postprocedural states: Secondary | ICD-10-CM | POA: Diagnosis not present

## 2021-05-09 DIAGNOSIS — M461 Sacroiliitis, not elsewhere classified: Secondary | ICD-10-CM | POA: Diagnosis not present

## 2021-05-09 LAB — POCT UA - MICROSCOPIC ONLY: Epithelial cells, urine per micros: >20

## 2021-05-09 LAB — POCT URINALYSIS DIP (MANUAL ENTRY)
Blood, UA: NEGATIVE
Glucose, UA: NEGATIVE mg/dL
Nitrite, UA: NEGATIVE
Spec Grav, UA: 1.025 (ref 1.010–1.025)
Urobilinogen, UA: 0.2 E.U./dL
pH, UA: 7 (ref 5.0–8.0)

## 2021-05-09 NOTE — Patient Instructions (Signed)
I am not sure.  Your exam strongly suggests a musculoskeletal problem.  By exam, you seem to have sacroiliitis.   Since you have failed ibuprofen, go ahead and fill the prednisone prescription.  I do not think you need the muscle relaxers for this problem.   I will call or send a my chart message with the urine result. Go ahead and see your gynecologist.  Gynecologic problems can cause back pain.   You will need more testing if the problem does not go away or turns out to be a recurrent problem.

## 2021-05-10 ENCOUNTER — Encounter: Payer: Self-pay | Admitting: Family Medicine

## 2021-05-10 DIAGNOSIS — M461 Sacroiliitis, not elsewhere classified: Secondary | ICD-10-CM | POA: Insufficient documentation

## 2021-05-10 DIAGNOSIS — Z9889 Other specified postprocedural states: Secondary | ICD-10-CM | POA: Insufficient documentation

## 2021-05-10 NOTE — Progress Notes (Signed)
    SUBJECTIVE:   CHIEF COMPLAINT / HPI:   Patient with 9 days of bilateral hip/buttock/low back pain.  She does not feel like it is in her spine.  Seems lower and more lateral (not midline).    She wonders if it is somehow related to the problems that led to her endometrial ablation March 2022.  Reviewed that ultrasound which showed retroverted uterus, probable fibroid and hemorrhagic cyst.  She has already scheduled a FU with her gynecologist.  Some scant white vag discharge.  Denies feverl  Also denies urgency, frequency and dysuria.    Does have some constipation.  Pain is definitely positional.  Worse when lying down.  Inhibits her ability to roll over in bed and get up.  She has been taking ibuprofen 800 regularly without benefit.  No other joints involved.    Seen at Urgent Care.  They prescribed both a muscle relaxer and prednisone.  She has not yet picked up either prescription.  She was waiting for this appointment.    OBJECTIVE:   BP 122/80   Pulse 92   Ht 5\' 6"  (1.676 m)   Wt 199 lb 9.6 oz (90.5 kg)   LMP 05/01/2021   SpO2 96%   BMI 32.22 kg/m   Lungs clear Cardiac RRR without m or g Abd benign Ext a little tight to internal and external rotation of both hips.  Biggest tenderness is bilaterally over SI joints on deep palpation.  Lumbar spine and paraspinous muscles not tender to palpation.    ASSESSMENT/PLAN:   Sacroiliitis (HCC) Clearly musculoskeletal in origin by H&PE.  Sacroiliitis is best fit dx.  Of course SI joint inflammation can be a sign of underlying rheumatologic disease.  This is her first episode and only 9 days in duration.    Plan. OK to fill prednisone.  Do not fill muscle relaxer since I doubt would help.  If remains symptomatic, return in 1-2 weeks.  At that point imaging and likely screening inflammatory markers for rheum disease.  She also intends to see her gynecologist.  It is conceivable that her fibroid or another ovarian cyst is  contributing.    S/P endometrial ablation She was not aware but it made sense based on how she had felt that she still goes through monthly menstrual cycles only without any bleeding.       07/01/2021, MD [facility]

## 2021-05-10 NOTE — Assessment & Plan Note (Signed)
Clearly musculoskeletal in origin by H&PE.  Sacroiliitis is best fit dx.  Of course SI joint inflammation can be a sign of underlying rheumatologic disease.  This is her first episode and only 9 days in duration.    Plan. OK to fill prednisone.  Do not fill muscle relaxer since I doubt would help.  If remains symptomatic, return in 1-2 weeks.  At that point imaging and likely screening inflammatory markers for rheum disease.  She also intends to see her gynecologist.  It is conceivable that her fibroid or another ovarian cyst is contributing.

## 2021-05-10 NOTE — Assessment & Plan Note (Signed)
She was not aware but it made sense based on how she had felt that she still goes through monthly menstrual cycles only without any bleeding.

## 2021-05-29 ENCOUNTER — Ambulatory Visit: Payer: Medicaid Other | Admitting: Obstetrics & Gynecology

## 2021-08-12 ENCOUNTER — Ambulatory Visit (HOSPITAL_COMMUNITY)
Admission: EM | Admit: 2021-08-12 | Discharge: 2021-08-12 | Disposition: A | Discharge status: Home / Self Care | Payer: Medicaid Other | Attending: Medical Oncology | Admitting: Medical Oncology

## 2021-08-12 ENCOUNTER — Encounter (HOSPITAL_COMMUNITY): Payer: Self-pay | Admitting: Emergency Medicine

## 2021-08-12 ENCOUNTER — Other Ambulatory Visit: Payer: Self-pay

## 2021-08-12 DIAGNOSIS — M5441 Lumbago with sciatica, right side: Secondary | ICD-10-CM | POA: Diagnosis not present

## 2021-08-12 DIAGNOSIS — W19XXXA Unspecified fall, initial encounter: Secondary | ICD-10-CM

## 2021-08-12 DIAGNOSIS — M5442 Lumbago with sciatica, left side: Secondary | ICD-10-CM | POA: Diagnosis not present

## 2021-08-12 DIAGNOSIS — M25561 Pain in right knee: Secondary | ICD-10-CM | POA: Diagnosis not present

## 2021-08-12 MED ORDER — LIDOCAINE 5 % EX PTCH: 1.0000 | MEDICATED_PATCH | CUTANEOUS | 0 refills | Status: DC

## 2021-08-12 NOTE — Discharge Instructions (Addendum)
Sciatica Rehab °Ask your health care provider which exercises are safe for you. Do exercises exactly as told by your health care provider and adjust them as directed. It is normal to feel mild stretching, pulling, tightness, or discomfort as you do these exercises. Stop right away if you feel sudden pain or your pain gets worse. Do not begin these exercises until told by your health care provider. °Stretching and range-of-motion exercises °These exercises warm up your muscles and joints and improve the movement and flexibility of your hips and back. These exercises also help to relieve pain,numbness, and tingling. °Sciatic nerve glide °Sit in a chair with your head facing down toward your chest. Place your hands behind your back. Let your shoulders slump forward. °Slowly straighten one of your legs while you tilt your head back as if you are looking toward the ceiling. Only straighten your leg as far as you can without making your symptoms worse. °Hold this position for ____30______ seconds. °Slowly return to the starting position. °Repeat with your other leg. °Repeat ____5______ times. Complete this exercise _____2_____ times a day. °Knee to chest with hip adduction and internal rotation ° °Lie on your back on a firm surface with both legs straight. °Bend one of your knees and move it up toward your chest until you feel a gentle stretch in your lower back and buttock. Then, move your knee toward the shoulder that is on the opposite side from your leg. This is hip adduction and internal rotation. °Hold your leg in this position by holding on to the front of your knee. °Hold this position for _____30_____ seconds. °Slowly return to the starting position. °Repeat with your other leg. °Repeat ___5_______ times. Complete this exercise ______2____ times a day. °Prone extension on elbows ° °Lie on your abdomen on a firm surface. A bed may be too soft for this exercise. °Prop yourself up on your elbows. °Use your arms to  help lift your chest up until you feel a gentle stretch in your abdomen and your lower back. °This will place some of your body weight on your elbows. If this is uncomfortable, try stacking pillows under your chest. °Your hips should stay down, against the surface that you are lying on. Keep your hip and back muscles relaxed. °Hold this position for _____30_____ seconds. °Slowly relax your upper body and return to the starting position. °Repeat ____5______ times. Complete this exercise _____2_____ times a day. °Strengthening exercises °These exercises build strength and endurance in your back. Endurance is theability to use your muscles for a long time, even after they get tired. °Pelvic tilt °This exercise strengthens the muscles that lie deep in the abdomen. °Lie on your back on a firm surface. Bend your knees and keep your feet flat on the floor. °Tense your abdominal muscles. Tip your pelvis up toward the ceiling and flatten your lower back into the floor. °To help with this exercise, you may place a small towel under your lower back and try to push your back into the towel. °Hold this position for _____30_____ seconds. °Let your muscles relax completely before you repeat this exercise. °Repeat ____5______ times. Complete this exercise ____2______ times a day. °Alternating arm and leg raises ° °Get on your hands and knees on a firm surface. If you are on a hard floor, you may want to use padding, such as an exercise mat, to cushion your knees. °Line up your arms and legs. Your hands should be directly below your shoulders, and your   knees should be directly below your hips. °Lift your left leg behind you. At the same time, raise your right arm and straighten it in front of you. °Do not lift your leg higher than your hip. °Do not lift your arm higher than your shoulder. °Keep your abdominal and back muscles tight. °Keep your hips facing the ground. °Do not arch your back. °Keep your balance carefully, and do not  hold your breath. °Hold this position for ____30______ seconds. °Slowly return to the starting position. °Repeat with your right leg and your left arm. °Repeat ____5______ times. Complete this exercise _____2_____ times a day. °Posture and body mechanics °Good posture and healthy body mechanics can help to relieve stress in your body's tissues and joints. Body mechanics refers to the movements and positions of your body while you do your daily activities. Posture is part of body mechanics. Good posture means: °Your spine is in its natural S-curve position (neutral). °Your shoulders are pulled back slightly. °Your head is not tipped forward. °Follow these guidelines to improve your posture and body mechanics in youreveryday activities. °Standing ° °When standing, keep your spine neutral and your feet about hip width apart. Keep a slight bend in your knees. Your ears, shoulders, and hips should line up. °When you do a task in which you stand in one place for a long time, place one foot up on a stable object that is 2-4 inches (5-10 cm) high, such as a footstool. This helps keep your spine neutral. ° °Sitting ° °When sitting, keep your spine neutral and keep your feet flat on the floor. Use a footrest, if necessary, and keep your thighs parallel to the floor. Avoid rounding your shoulders, and avoid tilting your head forward. °When working at a desk or a computer, keep your desk at a height where your hands are slightly lower than your elbows. Slide your chair under your desk so you are close enough to maintain good posture. °When working at a computer, place your monitor at a height where you are looking straight ahead and you do not have to tilt your head forward or downward to look at the screen. ° °Resting °When lying down and resting, avoid positions that are most painful for you. °If you have pain with activities such as sitting, bending, stooping, or squatting, lie in a position in which your body does not bend  very much. For example, avoid curling up on your side with your arms and knees near your chest (fetal position). °If you have pain with activities such as standing for a long time or reaching with your arms, lie with your spine in a neutral position and bend your knees slightly. Try the following positions: °Lying on your side with a pillow between your knees. °Lying on your back with a pillow under your knees. °Lifting ° °When lifting objects, keep your feet at least shoulder width apart and tighten your abdominal muscles. °Bend your knees and hips and keep your spine neutral. It is important to lift using the strength of your legs, not your back. Do not lock your knees straight out. °Always ask for help to lift heavy or awkward objects. ° °This information is not intended to replace advice given to you by your health care provider. Make sure you discuss any questions you have with your healthcare provider. °Document Revised: 10/31/2018 Document Reviewed: 07/31/2018 °Elsevier Patient Education © 2022 [employer] ° °

## 2021-08-12 NOTE — ED Provider Notes (Signed)
[facility]    CSN: [id number] Arrival date & time: 08/12/21  1452      History   Chief Complaint Chief Complaint  Patient presents with   Fall    HPI Ruth [name] is a 41 y.o. female.   HPI  Fall: Patient states that yesterday she somehow tripped and fell forward.  She states that she tried to catch herself and braced herself with her back and landed on her knees.  She has a small scrape of her right knee.  She states that overall she just feels sore but she want to make sure that her back was okay as she had been having some bilateral back pain that extended down into her buttock and up on the left side.  She denies any other injuries during this encounter including head injury or LOC.  She has tried Tylenol for symptoms which do help some.  She denies any loss of bowel or bladder function, neuro changes. She reports that she is UTD on her Tdap.   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   Health maintenance examination 03/13/2016   Back pain 03/13/2016   Left knee injury 06/22/2014   Obesity (BMI 30-39.9) 06/25/2013   Contact dermatitis 06/15/2013   Cerumen impaction 08/22/2012   Birth control 03/14/2012   Constipation 11/09/2011   Left knee pain 07/09/2011   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   ASTHMA, INTERMITTENT 02/19/2007    Past Surgical History:  Procedure Laterality Date   CHOLECYSTECTOMY  10/31/2011   Procedure: LAPAROSCOPIC CHOLECYSTECTOMY;  Surgeon: Liz Malady, MD;   Location: [facility];  Service: General;  Laterality: N/A;   DILATION AND CURETTAGE OF UTERUS     ENDOMETRIAL ABLATION N/A 09/21/2020   Procedure: ENDOMETRIAL ABLATION WITH NOVASURE;  Surgeon: Adam Phenix, MD;  Location: [facility];  Service: Gynecology;  Laterality: N/A;   KNEE SURGERY Left 08/2014   LAPAROSCOPIC TUBAL LIGATION Bilateral 07/24/2016   Procedure: LAPAROSCOPIC TUBAL LIGATION;  Surgeon: Adam Phenix, MD;  Location: [facility];  Service: Gynecology;  Laterality: Bilateral;   WISDOM TOOTH EXTRACTION      OB History     Gravida  6   Para  4   Term  4   Preterm  0   AB  2   Living  4      SAB  2   IAB  0   Ectopic  0   Multiple  0   Live Births  4        Obstetric Comments  Have 4 children, vaginal deliveries           Home Medications    Prior to Admission medications   Not on File    Family History Family History  Problem Relation Age of Onset   Drug abuse Mother    Congestive Heart Failure Mother    Cancer Father     Social History Social History   Tobacco Use  Smoking status: Former    Packs/day: 0.20    Types: Cigarettes, Cigars   Smokeless tobacco: Never  Vaping Use   Vaping Use: Never used  Substance Use Topics   Alcohol use: Yes    Alcohol/week: 1.0 standard drink    Types: 1 Glasses of wine per week    Comment: socially    Drug use: Not Currently    Types: Marijuana    Comment: occassionally      Allergies   Hydrocodone-acetaminophen   Review of Systems Review of Systems  As stated above in HPI Physical Exam Triage Vital Signs ED Triage Vitals  Enc Vitals Group     BP 08/12/21 1541 123/80     Pulse Rate 08/12/21 1541 88     Resp 08/12/21 1541 20     Temp 08/12/21 1541 98.8 F (37.1 C)     Temp Source 08/12/21 1541 Oral     SpO2 08/12/21 1541 99 %     Weight --      Height --      Head Circumference --      Peak Flow --      Pain Score 08/12/21 1538 4     Pain Loc --      Pain Edu? --       Excl. in [place]? --    No data found.  Updated Vital Signs BP 123/80 (BP Location: Left Arm) Comment (BP Location): large cuff   Pulse 88    Temp 98.8 F (37.1 C) (Oral)    Resp 20    SpO2 99%   Physical Exam Vitals and nursing note reviewed.  Constitutional:      General: She is not in acute distress.    Appearance: Normal appearance. She is not ill-appearing, toxic-appearing or diaphoretic.  HENT:     Head: Normocephalic and atraumatic.  Musculoskeletal:        General: Normal range of motion.     Comments: No midline tenderness of spine.  There is some palpable reproducible tenderness of the left low back muscles.  No SI joint tenderness.  Normal range of motion of back and neck.  Normal straight leg raise bilaterally.  There is a small resolving abrasion of the right knee without any evidence of foreign body.  No erythema, edema or instability of the knee.  No tenderness to palpation.  Neurological:     Mental Status: She is alert and oriented to person, place, and time.     Motor: No weakness.     Coordination: Coordination normal.     Gait: Gait normal.     Deep Tendon Reflexes: Reflexes normal.     UC Treatments / Results  Labs (all labs ordered are listed, but only abnormal results are displayed) Labs Reviewed - No data to display  EKG   Radiology No results found.  Procedures Procedures (including critical care time)  Medications Ordered in UC Medications - No data to display  Initial Impression / Assessment and Plan / UC Course  I have reviewed the triage vital signs and the nursing notes.  Pertinent labs & imaging results that were available during my care of the patient were reviewed by me and considered in my medical decision making (see chart for details).     New.  It appears that she is having some musculoskeletal soreness from her fall.  She is hesitant towards taking medications for her symptoms but we discussed lidocaine patch which should have  very few  side effects compared to others.  She would like to try this along with massage, heating pad and Epson salt soaks.  We discussed red flag signs and symptoms.  Follow-up as needed. Final Clinical Impressions(s) / UC Diagnoses   Final diagnoses:  None   Discharge Instructions   None    ED Prescriptions   None    PDMP not reviewed this encounter.   Rushie Chestnut, [place] 08/12/21 1621

## 2021-08-12 NOTE — ED Triage Notes (Signed)
Fell yesterday while walking.  Landed on right knee, scrape to area below knee.  Patient also reports lower back pain

## 2021-09-11 ENCOUNTER — Other Ambulatory Visit: Payer: Self-pay

## 2021-09-11 ENCOUNTER — Ambulatory Visit
Admission: EM | Admit: 2021-09-11 | Discharge: 2021-09-11 | Disposition: A | Discharge status: Home / Self Care | Payer: BLUE CROSS/BLUE SHIELD | Attending: Emergency Medicine | Admitting: Emergency Medicine

## 2021-09-11 DIAGNOSIS — L0201 Cutaneous abscess of face: Secondary | ICD-10-CM

## 2021-09-11 MED ORDER — SULFAMETHOXAZOLE-TRIMETHOPRIM 800-160 MG PO TABS: 1.0000 | ORAL_TABLET | Freq: Two times a day (BID) | ORAL | 0 refills | Status: AC

## 2021-09-11 MED ORDER — FLUCONAZOLE 150 MG PO TABS: ORAL_TABLET | ORAL | 0 refills | Status: DC

## 2021-09-11 NOTE — ED Provider Notes (Signed)
[facility]    CSN: [id number] Arrival date & time: 09/11/21  1448    HISTORY   Chief Complaint  Patient presents with   Insect Bite   HPI Ruth [name] is a 41 y.o. female. Pt reports possibly getting an insect bite on her face 3 days ago, she reports she has been noticing some swelling to the left side of her face today. Patient denies SOB at this time.  The history is provided by the patient.  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   Health maintenance examination 03/13/2016   Back pain 03/13/2016   Left knee injury 06/22/2014   Obesity (BMI 30-39.9) 06/25/2013   Contact dermatitis 06/15/2013   Cerumen impaction 08/22/2012   Birth control 03/14/2012   Constipation 11/09/2011   Left knee pain 07/09/2011   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   ASTHMA, INTERMITTENT 02/19/2007   Past Surgical History:  Procedure Laterality Date   CHOLECYSTECTOMY  10/31/2011   Procedure: LAPAROSCOPIC CHOLECYSTECTOMY;  Surgeon: Zenovia Jarred, MD;  Location: [facility];  Service: General;  Laterality: N/A;   DILATION AND CURETTAGE OF UTERUS     ENDOMETRIAL ABLATION N/A 09/21/2020   Procedure: ENDOMETRIAL ABLATION WITH NOVASURE;  Surgeon: Woodroe Mode, MD;  Location: [facility];  Service: Gynecology;  Laterality: N/A;   KNEE SURGERY Left 08/2014   LAPAROSCOPIC TUBAL LIGATION Bilateral 07/24/2016   Procedure: LAPAROSCOPIC TUBAL LIGATION;  Surgeon: Woodroe Mode, MD;  Location: [facility];  Service:  Gynecology;  Laterality: Bilateral;   WISDOM TOOTH EXTRACTION     OB History     Gravida  6   Para  4   Term  4   Preterm  0   AB  2   Living  4      SAB  2   IAB  0   Ectopic  0   Multiple  0   Live Births  4        Obstetric Comments  Have 4 children, vaginal deliveries         Home Medications    Prior to Admission medications   Medication Sig Start Date End Date Taking? Authorizing Provider  lidocaine (LIDODERM) 5 % Place 1 patch onto the skin daily. Remove & Discard patch within 12 hours or as directed by MD 08/12/21   Hughie Closs, PA-C    Family History Family History  Problem Relation Age of Onset   Drug abuse Mother    Congestive Heart Failure Mother    Cancer Father    Social History Social History   Tobacco Use   Smoking status: Former    Packs/day: 0.20    Types: Cigarettes, Cigars   Smokeless tobacco: Never  Vaping Use   Vaping Use: Never used  Substance Use Topics   Alcohol use: Yes    Alcohol/week: 1.0 standard drink    Types: 1 Glasses of wine per week    Comment: socially  Drug use: Not Currently    Types: Marijuana    Comment: occassionally    Allergies   Hydrocodone-acetaminophen  Review of Systems Review of Systems Pertinent findings noted in history of present illness.   Physical Exam Triage Vital Signs ED Triage Vitals  Enc Vitals Group     BP 05/19/21 0827 (!) 147/82     Pulse Rate 05/19/21 0827 72     Resp 05/19/21 0827 18     Temp 05/19/21 0827 98.3 F (36.8 C)     Temp Source 05/19/21 0827 Oral     SpO2 05/19/21 0827 98 %     Weight --      Height --      Head Circumference --      Peak Flow --      Pain Score 05/19/21 0826 5     Pain Loc --      Pain Edu? --      Excl. in [place]? --   No data found.  Updated Vital Signs BP 127/87 (BP Location: Right Arm)    Pulse 77    Temp 98.6 F (37 C) (Oral)    Resp 18    SpO2 99%   Physical Exam Vitals and nursing note reviewed.  Constitutional:       General: She is not in acute distress.    Appearance: Normal appearance. She is not ill-appearing.  HENT:     Head: Normocephalic and atraumatic.  Eyes:     General: Lids are normal.        Right eye: No discharge.        Left eye: No discharge.     Extraocular Movements: Extraocular movements intact.     Conjunctiva/sclera: Conjunctivae normal.     Right eye: Right conjunctiva is not injected.     Left eye: Left conjunctiva is not injected.  Neck:     Trachea: Trachea and phonation normal.  Cardiovascular:     Rate and Rhythm: Normal rate and regular rhythm.     Pulses: Normal pulses.     Heart sounds: Normal heart sounds. No murmur heard.   No friction rub. No gallop.  Pulmonary:     Effort: Pulmonary effort is normal. No accessory muscle usage, prolonged expiration or respiratory distress.     Breath sounds: Normal breath sounds. No stridor, decreased air movement or transmitted upper airway sounds. No decreased breath sounds, wheezing, rhonchi or rales.  Chest:     Chest wall: No tenderness.  Musculoskeletal:        General: Normal range of motion.     Cervical back: Normal range of motion and neck supple. Normal range of motion.  Lymphadenopathy:     Cervical: No cervical adenopathy.  Skin:    General: Skin is warm and dry.     Findings: Lesion (Medial aspect of left eyebrow with erythema, swelling, no fluctuance appreciated.  Punctate present.) present. No erythema or rash.  Neurological:     General: No focal deficit present.     Mental Status: She is alert and oriented to person, place, and time.  Psychiatric:        Mood and Affect: Mood normal.        Behavior: Behavior normal.    Visual Acuity Right Eye Distance:   Left Eye Distance:   Bilateral Distance:    Right Eye Near:   Left Eye Near:    Bilateral Near:     UC Couse / Diagnostics / Procedures:  EKG  Radiology No results found.  Procedures Procedures (including critical care time)  UC  Diagnoses / Final Clinical Impressions(s)   I have reviewed the triage vital signs and the nursing notes.  Pertinent labs & imaging results that were available during my care of the patient were reviewed by me and considered in my medical decision making (see chart for details).    Final diagnoses:  Acute abscess of face   Patient provided with a prescription for Bactrim.  Patient provided with prescription for Diflucan for treatment of inevitable vaginal yeast infection.  ED Prescriptions     Medication Sig Dispense Auth. Provider   sulfamethoxazole-trimethoprim (BACTRIM DS) 800-160 MG tablet Take 1 tablet by mouth 2 (two) times daily for 10 days. 20 tablet Lynden Oxford Scales, PA-C   fluconazole (DIFLUCAN) 150 MG tablet Take 1 tablet on day 4 of antibiotics.  Take second tablet 3 days later. 2 tablet Lynden Oxford Scales, PA-C      PDMP not reviewed this encounter.  Pending results:  Labs Reviewed - No data to display  Medications Ordered in UC: Medications - No data to display  Disposition Upon Discharge:  Condition: stable for discharge home Home: take medications as prescribed; routine discharge instructions as discussed; follow up as advised.  Patient presented with an acute illness with associated systemic symptoms and significant discomfort requiring urgent management. In my opinion, this is a condition that a prudent lay person (someone who possesses an average knowledge of health and medicine) may potentially expect to result in complications if not addressed urgently such as respiratory distress, impairment of bodily function or dysfunction of bodily organs.   Routine symptom specific, illness specific and/or disease specific instructions were discussed with the patient and/or caregiver at length.   As such, the patient has been evaluated and assessed, work-up was performed and treatment was provided in alignment with urgent care protocols and evidence based  medicine.  Patient/parent/caregiver has been advised that the patient may require follow up for further testing and treatment if the symptoms continue in spite of treatment, as clinically indicated and appropriate.  If the patient was tested for COVID-19, Influenza and/or RSV, then the patient/parent/guardian was advised to isolate at home pending the results of his/her diagnostic coronavirus test and potentially longer if theyre positive. I have also advised pt that if his/her COVID-19 test returns positive, it's recommended to self-isolate for at least 10 days after symptoms first appeared AND until fever-free for 24 hours without fever reducer AND other symptoms have improved or resolved. Discussed self-isolation recommendations as well as instructions for household member/close contacts as per the [facility] and [place] DHHS, and also gave patient the [place] packet with this information.  Patient/parent/caregiver has been advised to return to the [facility] or PCP in 3-5 days if no better; to PCP or the Emergency Department if new signs and symptoms develop, or if the current signs or symptoms continue to change or worsen for further workup, evaluation and treatment as clinically indicated and appropriate  The patient will follow up with their current PCP if and as advised. If the patient does not currently have a PCP we will assist them in obtaining one.   The patient may need specialty follow up if the symptoms continue, in spite of conservative treatment and management, for further workup, evaluation, consultation and treatment as clinically indicated and appropriate.   Patient/parent/caregiver verbalized understanding and agreement of plan as discussed.  All questions were addressed during visit.  Please see  discharge instructions below for further details of plan.  Discharge Instructions:   Discharge Instructions      Please begin Bactrim for broad-spectrum coverage of bacteria that commonly cause  infections in the skin.  Please take 1 tablet twice daily for the next 10 days.  After 7 days, if your skin lesion has completely resolved, you can discontinue.  I provided you with a prescription for Diflucan for the inevitable vaginal yeast infection that is caused by antibiotic use.  Please take the first tablet on day 4 of your antibiotic treatment and take the second tablet 3 days later.    This office note has been dictated using Museum/gallery curator.  Unfortunately, and despite my best efforts, this method of dictation can sometimes lead to occasional typographical or grammatical errors.  I apologize in advance if this occurs.     Lynden Oxford Scales, PA-C 09/11/21 1540

## 2021-09-11 NOTE — Discharge Instructions (Addendum)
Please begin Bactrim for broad-spectrum coverage of bacteria that commonly cause infections in the skin.  Please take 1 tablet twice daily for the next 10 days.  After 7 days, if your skin lesion has completely resolved, you can discontinue.  I provided you with a prescription for Diflucan for the inevitable vaginal yeast infection that is caused by antibiotic use.  Please take the first tablet on day 4 of your antibiotic treatment and take the second tablet 3 days later.

## 2021-09-11 NOTE — ED Triage Notes (Signed)
Pt reports possibly getting an insect bite to face, she reports she has been noticing some swelling to the left side of her face. Patient denies SOB at this time.  Started: Friday

## 2021-09-15 ENCOUNTER — Other Ambulatory Visit: Payer: Self-pay

## 2021-09-15 ENCOUNTER — Ambulatory Visit (INDEPENDENT_AMBULATORY_CARE_PROVIDER_SITE_OTHER): Payer: BLUE CROSS/BLUE SHIELD | Admitting: Family Medicine

## 2021-09-15 VITALS — BP 121/82 | HR 86 | Ht 66.0 in | Wt 186.1 lb

## 2021-09-15 DIAGNOSIS — L0201 Cutaneous abscess of face: Secondary | ICD-10-CM

## 2021-09-15 DIAGNOSIS — R519 Headache, unspecified: Secondary | ICD-10-CM | POA: Diagnosis not present

## 2021-09-15 DIAGNOSIS — T63301D Toxic effect of unspecified spider venom, accidental (unintentional), subsequent encounter: Secondary | ICD-10-CM | POA: Diagnosis not present

## 2021-09-15 NOTE — Progress Notes (Signed)
° ° °  SUBJECTIVE:   CHIEF COMPLAINT / HPI:   Concern for spider bite: Happened Friday she believes when she felt a pinch on her forehead. On Sunday she noticed it was swelling more and she went to urgent care on Monday and got bactrim. She has been using it but started getting headaches after taking the bactrim. No fevers and the lesion is improving. It has been itching a lot but is improving.   Headaches: She started getting a mild headache before taking the bactrim but it seems worse since taking the bactrim. The headache is around the lesion on the front aspect of her forehead above her eye. It has been coming and going. No photophobia.   PERTINENT  PMH / PSH: None  OBJECTIVE:   BP 121/82    Pulse 86    Ht 5\' 6"  (1.676 m)    Wt 186 lb 2 oz (84.4 kg)    SpO2 100%    BMI 30.04 kg/m    General: NAD, pleasant, able to participate in exam HEENT: Erythematous lesion present on the forehead with no palpable abscess underneath, it appears improved from the photo that she shows me while in the room.  There is no purulence draining from it.  There is a small punctate opening in the center. Respiratory: No respiratory distress Neuro: alert, no obvious focal deficits, CN II through XII intact, fine touch sensation intact in upper and lower extremities bilaterally, strength 5/5 in upper and lower extremities bilaterally Psych: Normal affect and mood     ASSESSMENT/PLAN:    Headaches: Seem to hurt around the lesion on her forehead, no associated photophobia.  These headaches have been going on since the lesion started but she has not tried any NSAIDs for them only occasional Tylenol.  She has a nonfocal neurologic exam.  I anticipate that these headaches are due to the lesion on her forehead as the throbbing pain seems to be around this.  No indication for imaging at this time.  Follow-up next week if they are not improving.  In the meantime I recommended using some Motrin/ibuprofen for her  headaches.  I see no contraindications to this in her chart.  Insect bite   abscess-resolving: Occurred on Friday when she thought she felt a "prick" in her forehead.  On Sunday she noticed that swelling up and becoming red.  She went to urgent care who diagnosed it as a small abscess possibly due to an insect bite.  She was started on Bactrim.  She has not had any fevers.  She states that the lesion seems to be improving.  She has a photo of the previous lesion and it does look less erythematous and less swollen than in the photo.  On physical exam there is no purulence to be released from it when palpating the lesion, no palpable abscess underneath.  There is some erythema of the skin but it appears improved compared to the image she has.  I discussed using Vaseline on top the skin and continue with the antibiotic.  Discussed return precautions.  Lurline Del, [place]

## 2021-09-15 NOTE — Patient Instructions (Signed)
For your headaches I think it is fine to take Motrin/ibuprofen 400 mg every 6 hours.  I expect that the headache is due to the spider bite.  If you start getting any confusion, dizziness, headaches that are the worst you have ever experienced in her life, or other concerning symptoms please seek care.  If you still have headaches by the end of the weekend it would be reasonable to follow-up with [place].  I want her to continue the antibiotic for the lesion on your forehead.  I anticipate that it will be improving in more throughout the weekend.  If it continues to improve you do not need follow-up.  If it worsens or does not continuing to improve please follow-up.  If you develop any fevers please follow-up.

## 2021-11-16 ENCOUNTER — Ambulatory Visit (INDEPENDENT_AMBULATORY_CARE_PROVIDER_SITE_OTHER): Payer: BLUE CROSS/BLUE SHIELD | Admitting: Student

## 2021-11-16 ENCOUNTER — Other Ambulatory Visit (HOSPITAL_COMMUNITY)
Admission: RE | Admit: 2021-11-16 | Discharge: 2021-11-16 | Disposition: A | Discharge status: Home / Self Care | Payer: BLUE CROSS/BLUE SHIELD | Source: Ambulatory Visit | Attending: Family Medicine | Admitting: Family Medicine

## 2021-11-16 ENCOUNTER — Encounter: Payer: Self-pay | Admitting: Student

## 2021-11-16 VITALS — BP 116/76 | HR 66 | Ht 66.0 in | Wt 189.2 lb

## 2021-11-16 DIAGNOSIS — N898 Other specified noninflammatory disorders of vagina: Secondary | ICD-10-CM

## 2021-11-16 DIAGNOSIS — N760 Acute vaginitis: Secondary | ICD-10-CM

## 2021-11-16 DIAGNOSIS — B9689 Other specified bacterial agents as the cause of diseases classified elsewhere: Secondary | ICD-10-CM | POA: Diagnosis present

## 2021-11-16 LAB — POCT WET PREP (WET MOUNT)
Clue Cells Wet Prep Whiff POC: POSITIVE
Trichomonas Wet Prep HPF POC: ABSENT

## 2021-11-16 MED ORDER — METRONIDAZOLE 500 MG PO TABS: 500.0000 mg | ORAL_TABLET | Freq: Two times a day (BID) | ORAL | 0 refills | Status: AC

## 2021-11-16 MED ORDER — FLUCONAZOLE 150 MG PO TABS: ORAL_TABLET | ORAL | 0 refills | Status: DC

## 2021-11-16 NOTE — Patient Instructions (Addendum)
It was great to see you! Thank you for allowing me to participate in your care!  ? ?I recommend that you always bring your medications to each appointment as this makes it easy to ensure we are on the correct medications and helps [place] not miss when refills are needed. ? ?Our plans for today:  ?- Your wet prep is pending, I will call with results ?- Please follow up with your gynecologist about more of your tubal ligation reversal questions ?- We will let you know what the STD testing shows ? ?We are checking some labs today, I will call you if they are abnormal will send you a MyChart message or a letter if they are normal.  If you do not hear about your labs in the next 2 weeks please let us know. ? ?Take care and seek immediate care sooner if you develop any concerns. Please remember to show up 15 minutes before your scheduled appointment time! ? ?Levin Erp, MD ?[facility]  ?

## 2021-11-16 NOTE — Progress Notes (Signed)
? ? ?  SUBJECTIVE:  ? ?CHIEF COMPLAINT / HPI:  ? ?Vaginal Discharge: ?Patient is a 41 y.o. female presenting with vaginal discharge for a few days.  She states the discharge is of thin white/gray consistency. She is interested in screening for sexually transmitted infections today. ? ?S/P Tubal Ligation ?Would like to discuss reversal of procedure as she would like to get pregnant again. Discussed that she does have a gynecologist. ? ?PERTINENT  PMH / PSH: None relevant ? ?OBJECTIVE:  ? ?BP 116/76   Pulse 66   Ht 5\' 6"  (1.676 m)   Wt 189 lb 3.2 oz (85.8 kg)   SpO2 100%   BMI 30.54 kg/m?   ? ?General: NAD, pleasant, able to participate in exam ?Respiratory: Normal effort, no obvious respiratory distress ?Pelvic: VULVA: normal appearing vulva with no masses, tenderness or lesions, VAGINA: Normal appearing vagina with normal color, no lesions, with clear and white discharge present, CERVIX: No lesions, clear, white, and thin discharge present, ? ?Chaperone Lavell Anchors CMA present for pelvic exam ? ?ASSESSMENT/PLAN:  ? ?No problem-specific Assessment & Plan notes found for this encounter. ?  ?Assessment:  ?41 y.o. female with vaginal discharge. Physical exam significant for white.gray thin discharge.  Wet prep performed today shows moderate clue cells, positive whiff test and moderate bacteria consistent with bacterial vaginosis.  Patient is not interested in STI screening.   ?Plan: ?-Wet prep as above.  Will treat with metronidazole 500 mg BID for 7 days.  And attest to getting yeast infections during treatments will prescribe Diflucan to take on fourth day of antibiotics and 3 days after that if needed. ?-F/u STD testing ?-Follow-up as needed ? ?S/P Tubal Ligation ?Patient told to call her gynecologist office to discuss qualifying for the procedure an more information about it. ? ?Gerrit Heck, MD ?[place]  ? ?

## 2021-11-17 ENCOUNTER — Encounter: Payer: Self-pay | Admitting: Student

## 2021-11-17 LAB — CERVICOVAGINAL ANCILLARY ONLY
Chlamydia: NEGATIVE
Comment: NEGATIVE
Comment: NEGATIVE
Comment: NORMAL
Neisseria Gonorrhea: NEGATIVE
Trichomonas: NEGATIVE

## 2021-11-17 LAB — RPR: RPR Ser Ql: NONREACTIVE

## 2021-11-17 LAB — HIV ANTIBODY (ROUTINE TESTING W REFLEX): HIV Screen 4th Generation wRfx: NONREACTIVE

## 2021-12-12 ENCOUNTER — Other Ambulatory Visit (HOSPITAL_COMMUNITY)
Admission: RE | Admit: 2021-12-12 | Discharge: 2021-12-12 | Disposition: A | Discharge status: Home / Self Care | Payer: BLUE CROSS/BLUE SHIELD | Source: Ambulatory Visit | Attending: Family Medicine | Admitting: Family Medicine

## 2021-12-12 ENCOUNTER — Ambulatory Visit (INDEPENDENT_AMBULATORY_CARE_PROVIDER_SITE_OTHER): Payer: BLUE CROSS/BLUE SHIELD | Admitting: Family Medicine

## 2021-12-12 VITALS — BP 116/79 | HR 61 | Ht 66.0 in | Wt 193.0 lb

## 2021-12-12 DIAGNOSIS — N760 Acute vaginitis: Secondary | ICD-10-CM | POA: Diagnosis not present

## 2021-12-12 DIAGNOSIS — Z124 Encounter for screening for malignant neoplasm of cervix: Secondary | ICD-10-CM | POA: Insufficient documentation

## 2021-12-12 DIAGNOSIS — B9689 Other specified bacterial agents as the cause of diseases classified elsewhere: Secondary | ICD-10-CM | POA: Diagnosis not present

## 2021-12-12 DIAGNOSIS — N898 Other specified noninflammatory disorders of vagina: Secondary | ICD-10-CM | POA: Diagnosis not present

## 2021-12-12 LAB — POCT WET PREP (WET MOUNT)
Clue Cells Wet Prep Whiff POC: POSITIVE
Trichomonas Wet Prep HPF POC: ABSENT
WBC, Wet Prep HPF POC: NONE SEEN

## 2021-12-12 MED ORDER — METRONIDAZOLE 500 MG PO TABS: 500.0000 mg | ORAL_TABLET | Freq: Two times a day (BID) | ORAL | 0 refills | Status: DC

## 2021-12-12 MED ORDER — FLUCONAZOLE 150 MG PO TABS: ORAL_TABLET | ORAL | 0 refills | Status: DC

## 2021-12-12 NOTE — Progress Notes (Cosign Needed Addendum)
    SUBJECTIVE:   CHIEF COMPLAINT / HPI:   Chief Complaint  Patient presents with   Vaginal irritation    Ruth [name] is a 41 y.o. female presents for vaginal irritation. She reports recurrent BV infections that began when she became sexually active with a prior partner and whenever she menstruates. She was found to have BV on 4/27, and finished her 7 day course of metronidazole. She also took a diflucan on day 4 of antibiotics and 3 days after finishing the course as she typically gets yeast infections after any antibiotics.   Vaginal Discharge Having vaginal discharge for several weeks, does not feel as though recent metronidazole took care of BV. Discharge consistency: thin Discharge color: white Medications tried:  Metronidazole course for BV at the end of April  - Present: odor, itching,  - Denies burning, abdominal pain, dysuria or hematuria, nausea or vomiting, fevers or pelvic pain.  - Discharge described as thin and white.  - Patient reports chlamydia, trichomonas in the past.  - Sexully active with one new female partner.  -  LMP: patient reports she had 2 periods this month, LMP 5/14 - Patient reports recurrent episodes of BV, yeast in the past.  - Patient denies douching and uses only sensitive skin products. She does use scented laundry detergent. -Contraception: bilateral tubal ligation Possible STD exposure: no known exposure, new partner.   Symptoms Fever: absent  Dysuria:absent Vaginal bleeding: since ablation, may get period twice a month Abdomen or Pelvic pain: absent Back pain: absent Genital sores or ulcers: absent Rash: no Pain during sex: no Odor: mild today  PERTINENT  PMH / PSH: reviewed and updated as appropriate   OBJECTIVE:   BP 116/79   Pulse 61   Ht 5\' 6"  (1.676 m)   Wt 193 lb (87.5 kg)   SpO2 100%   BMI 31.15 kg/m   GEN: well appearing female in no acute distress  CVS: well perfused  RESP: speaking in full sentences without pause   ABD: soft, non-tender, non-distended, no palpable masses  Pelvic exam: normal external genitalia, vulva, VAGINA and CERVIX: normal appearing cervix without discharge or lesions, WET MOUNT done - results: clue cells, cervical motion tenderness absent, multiparous os, ADNEXA: normal adnexa in size, nontender and no masses, exam chaperoned by Salvatore Marvel CMA.    ASSESSMENT/PLAN:   Health maintenance: - pap performed today as transformation zone was absent on 2019 pap but was normal   -STI (GC/CT/trich) testing added to PAP  BV (bacterial vaginosis)  Confirmed on wet prep.  - Treatment: Flagyl 500 BID x 7 days and abstain from coitus during course of treatment. Advised patient to not drink alcohol while taking this medication.  - Self care and preventative instructions provided. Handout given.  - F/U with PCP as needed.  - Return precautions including abdominal pain, fever, chills, nausea, or vomiting given.  - Advised to use barrier protection  -History of metronidazole induced yeast infection.  Prophylactic treatment with fluconazole provided, per patient request  Annia Belt, [place] NOTE    I personally evaluated this patient along with the student, and verified all aspects of the history, physical exam, and medical decision making as documented by the student. I agree with the student's documentation and have made all necessary edits.  Lyndee Hensen, DO PGY-3, [facility] 12/12/2021

## 2021-12-12 NOTE — Patient Instructions (Addendum)
See handout.  Stop by the pharmacy to pick up your prescriptions.

## 2021-12-13 LAB — CYTOLOGY - PAP
Chlamydia: NEGATIVE
Comment: NEGATIVE
Comment: NEGATIVE
Comment: NEGATIVE
Comment: NORMAL
Diagnosis: NEGATIVE
High risk HPV: NEGATIVE
Neisseria Gonorrhea: NEGATIVE
Trichomonas: NEGATIVE

## 2021-12-14 ENCOUNTER — Ambulatory Visit (INDEPENDENT_AMBULATORY_CARE_PROVIDER_SITE_OTHER): Payer: BLUE CROSS/BLUE SHIELD | Admitting: Obstetrics & Gynecology

## 2021-12-14 ENCOUNTER — Encounter: Payer: Self-pay | Admitting: Obstetrics & Gynecology

## 2021-12-14 VITALS — BP 121/79 | HR 73 | Ht 66.0 in | Wt 193.0 lb

## 2021-12-14 DIAGNOSIS — Z9889 Other specified postprocedural states: Secondary | ICD-10-CM | POA: Diagnosis not present

## 2021-12-14 DIAGNOSIS — B9689 Other specified bacterial agents as the cause of diseases classified elsewhere: Secondary | ICD-10-CM | POA: Diagnosis not present

## 2021-12-14 DIAGNOSIS — N760 Acute vaginitis: Secondary | ICD-10-CM

## 2021-12-14 MED ORDER — CLINDAMYCIN HCL 300 MG PO CAPS: 300.0000 mg | ORAL_CAPSULE | Freq: Two times a day (BID) | ORAL | 0 refills | Status: AC

## 2021-12-14 NOTE — Progress Notes (Signed)
Pt presents for consultation for reverse tubal ligation. Pt wants to try to conceive.

## 2021-12-14 NOTE — Progress Notes (Signed)
Patient ID: Ruth [name], female   DOB: 12-May-1981, 41 y.o.   MRN: [id number]  Cc: consultation about tubal ligation reversal   HPI Ruth [name] is a 41 y.o. female.  [id number] Patient's last menstrual period was 12/03/2021. S/P BTL and endometrial ablation. She asks if she could be a candidate for BTL reversal to conceive. She says if not she is ok with it. HPI  Past Medical History:  Diagnosis Date   Asthma    COVID-19 07/17/2020   GERD (gastroesophageal reflux disease)    Hypertension    HTN with pregnancy    Pneumonia    2013    Past Surgical History:  Procedure Laterality Date   CHOLECYSTECTOMY  10/31/2011   Procedure: LAPAROSCOPIC CHOLECYSTECTOMY;  Surgeon: Liz Malady, MD;  Location: [facility];  Service: General;  Laterality: N/A;   DILATION AND CURETTAGE OF UTERUS     ENDOMETRIAL ABLATION N/A 09/21/2020   Procedure: ENDOMETRIAL ABLATION WITH NOVASURE;  Surgeon: Adam Phenix, MD;  Location: [facility];  Service: Gynecology;  Laterality: N/A;   KNEE SURGERY Left 08/2014   LAPAROSCOPIC TUBAL LIGATION Bilateral 07/24/2016   Procedure: LAPAROSCOPIC TUBAL LIGATION;  Surgeon: Adam Phenix, MD;  Location: [facility];  Service: Gynecology;  Laterality: Bilateral;   WISDOM TOOTH EXTRACTION      Family History  Problem Relation Age of Onset   Drug abuse Mother    Congestive Heart Failure Mother    Cancer Father     Social History Social History   Tobacco Use   Smoking status: Every Day    Packs/day: 0.20    Types: Cigarettes, Cigars   Smokeless tobacco: Never  Vaping Use   Vaping Use: Never used  Substance Use Topics   Alcohol use: Yes    Alcohol/week: 1.0 standard drink    Types: 1 Glasses of wine per week    Comment: socially    Drug use: Not Currently    Types: Marijuana    Comment: occassionally     Allergies  Allergen Reactions   Hydrocodone-Acetaminophen Nausea And Vomiting    Current Outpatient Medications  Medication Sig Dispense  Refill   metroNIDAZOLE (FLAGYL) 500 MG tablet Take 1 tablet (500 mg total) by mouth 2 (two) times daily. 21 tablet 0   clindamycin (CLEOCIN) 300 MG capsule Take 1 capsule (300 mg total) by mouth 2 (two) times daily for 7 days. 14 capsule 0   fluconazole (DIFLUCAN) 150 MG tablet Take 1 tablet on day 4 of antibiotics.  Take second tablet 3 days later. (Patient not taking: Reported on 12/14/2021) 2 tablet 0   lidocaine (LIDODERM) 5 % Place 1 patch onto the skin daily. Remove & Discard patch within 12 hours or as directed by MD (Patient not taking: Reported on 12/14/2021) 30 patch 0   No current facility-administered medications for this visit.    Review of Systems Review of Systems  Constitutional: Negative.   Respiratory: Negative.    Genitourinary:  Positive for vaginal discharge (treated for BV X 2). Negative for menstrual problem.   Blood pressure 121/79, pulse 73, height 5\' 6"  (1.676 m), weight 193 lb (87.5 kg), last menstrual period 12/03/2021.  Physical Exam Physical Exam Constitutional:      Appearance: Normal appearance.  Cardiovascular:     Rate and Rhythm: Normal rate.  Pulmonary:     Effort: Pulmonary effort is normal.  Skin:    General: Skin is warm and dry.  Neurological:     General:  No focal deficit present.     Mental Status: She is alert and oriented to person, place, and time.  Psychiatric:        Mood and Affect: Mood normal.        Behavior: Behavior normal.    Data Reviewed Medication list  Assessment Patient is a poor candidate for BTL reversal s/p ablation BV  Plan If BV recurs use cleocin    Scheryl Darter 12/14/2021, 3:28 PM

## 2021-12-15 ENCOUNTER — Encounter (HOSPITAL_COMMUNITY): Payer: Self-pay | Admitting: Emergency Medicine

## 2021-12-15 ENCOUNTER — Ambulatory Visit (HOSPITAL_COMMUNITY)
Admission: EM | Admit: 2021-12-15 | Discharge: 2021-12-15 | Disposition: A | Discharge status: Home / Self Care | Payer: BLUE CROSS/BLUE SHIELD

## 2021-12-15 DIAGNOSIS — S39012A Strain of muscle, fascia and tendon of lower back, initial encounter: Secondary | ICD-10-CM

## 2021-12-15 NOTE — Discharge Instructions (Signed)
Take ibuprofen 600mg  twice daily for the next 4-5 days for inflammation. Use heating pad to back and perform gentle stretches. Use robaxin for muscle spasm at night. Do not take this medication and drive/drink alcohol.  If you develop any new or worsening symptoms or do not improve in the next 2 to 3 days, please return.  If your symptoms are severe, please go to the emergency room.  Follow-up with your primary care provider for further evaluation and management of your symptoms as well as ongoing wellness visits.  I hope you feel better!

## 2021-12-15 NOTE — ED Provider Notes (Signed)
[facility]    CSN: [id number] Arrival date & time: 12/15/21  1603      History   Chief Complaint Chief Complaint  Patient presents with   Back Pain    HPI Ruth [name] is a 41 y.o. female.   Patient presents to urgent care for evaluation of her low back pain that is mostly to the left side that she has had for the last 2 days.  She works at the [employer] and states that she is constantly bending over and lifting heavy items/twisting at her waist.  Pain is to lumbar spine region and worse on the left side.  Currently rates low back pain at a 5 on a scale of 0-10 and describes it as a "pulling and tight pain".  Pain slightly improves with rest and movement makes pain worse.  Patient denies urinary symptoms including urinary frequency, dysuria, and urinary urgency.  She also denies abdominal pain, fever/chills, and numbness and tingling to distal extremities.  Today, pain began to radiate down the back of her left leg to her knee intermittently.  This radiating pain lasted for less than 5 seconds and only happened one time.  She denies trauma, injury, and falls.  She has had this pain before and it has been treated successfully with anti-inflammatory medications and muscle relaxers.  She does state that she does not like taking medication and would prefer to use herbs and supplements to control inflammation instead.  She has been taking ibuprofen and Tylenol intermittently for pain and inflammation.  Denies any other aggravating or relieving factors for symptoms at this time.   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   ASTHMA, INTERMITTENT 02/19/2007    Past Surgical History:  Procedure Laterality Date   CHOLECYSTECTOMY  10/31/2011   Procedure: LAPAROSCOPIC CHOLECYSTECTOMY;  Surgeon: Zenovia Jarred, MD;  Location: [facility];  Service: General;  Laterality: N/A;   DILATION AND CURETTAGE OF UTERUS     ENDOMETRIAL ABLATION N/A 09/21/2020   Procedure: ENDOMETRIAL ABLATION WITH NOVASURE;  Surgeon: Woodroe Mode, MD;  Location: [facility];  Service: Gynecology;  Laterality: N/A;   KNEE SURGERY Left 08/2014   LAPAROSCOPIC TUBAL LIGATION Bilateral 07/24/2016   Procedure: LAPAROSCOPIC TUBAL LIGATION;  Surgeon: Woodroe Mode, MD;  Location: [facility];  Service: Gynecology;  Laterality: Bilateral;   WISDOM TOOTH EXTRACTION      OB History     Gravida  6   Para  4   Term  4   Preterm  0   AB  2   Living  4      SAB  2   IAB  0   Ectopic  0   Multiple  0   Live Births  4        Obstetric Comments  Have 4 children, vaginal deliveries           Home Medications    Prior to Admission medications   Medication Sig Start  Date End Date Taking? Authorizing Provider  clindamycin (CLEOCIN) 300 MG capsule Take 1 capsule (300 mg total) by mouth 2 (two) times daily for 7 days. 12/14/21 12/21/21  Woodroe Mode, MD  fluconazole (DIFLUCAN) 150 MG tablet Take 1 tablet on day 4 of antibiotics.  Take second tablet 3 days later. Patient not taking: Reported on 12/14/2021 12/12/21   Lyndee Hensen, DO  lidocaine (LIDODERM) 5 % Place 1 patch onto the skin daily. Remove & Discard patch within 12 hours or as directed by MD Patient not taking: Reported on 12/14/2021 08/12/21   Hughie Closs, PA-C  metroNIDAZOLE (FLAGYL) 500 MG tablet Take 1 tablet (500 mg total) by mouth 2 (two) times daily. 12/12/21   Lyndee Hensen, DO    Family History Family History  Problem Relation Age of Onset   Drug abuse Mother     Congestive Heart Failure Mother    Cancer Father     Social History Social History   Tobacco Use   Smoking status: Every Day    Packs/day: 0.20    Types: Cigarettes, Cigars   Smokeless tobacco: Never  Vaping Use   Vaping Use: Never used  Substance Use Topics   Alcohol use: Yes    Alcohol/week: 1.0 standard drink    Types: 1 Glasses of wine per week    Comment: socially    Drug use: Not Currently    Types: Marijuana    Comment: occassionally      Allergies   Hydrocodone-acetaminophen   Review of Systems Review of Systems  Musculoskeletal:  Positive for back pain.  Per HPI  Physical Exam Triage Vital Signs ED Triage Vitals  Enc Vitals Group     BP 12/15/21 1637 138/81     Pulse Rate 12/15/21 1637 71     Resp 12/15/21 1637 16     Temp 12/15/21 1637 97.8 F (36.6 C)     Temp Source 12/15/21 1637 Oral     SpO2 12/15/21 1637 100 %     Weight --      Height --      Head Circumference --      Peak Flow --      Pain Score 12/15/21 1636 5     Pain Loc --      Pain Edu? --      Excl. in [place]? --    No data found.  Updated Vital Signs BP 138/81 (BP Location: Right Arm)   Pulse 71   Temp 97.8 F (36.6 C) (Oral)   Resp 16   LMP 12/03/2021   SpO2 100%   Visual Acuity Right Eye Distance:   Left Eye Distance:   Bilateral Distance:    Right Eye Near:   Left Eye Near:    Bilateral Near:     Physical Exam Vitals and nursing note reviewed.  Constitutional:      General: She is not in acute distress.    Appearance: Normal appearance. She is well-developed. She is obese. She is not ill-appearing.  HENT:     Head: Normocephalic and atraumatic.     Right Ear: External ear normal.     Left Ear: External ear normal.     Nose: Nose normal.     Mouth/Throat:     Lips: Pink.     Mouth: Mucous membranes are moist.  Eyes:     Extraocular Movements: Extraocular movements intact.     Conjunctiva/sclera: Conjunctivae normal.  Cardiovascular:     Rate  and Rhythm:  Normal rate and regular rhythm.     Heart sounds: Normal heart sounds. No murmur heard.   No friction rub. No gallop.  Pulmonary:     Effort: Pulmonary effort is normal. No respiratory distress.     Breath sounds: Normal breath sounds. No wheezing, rhonchi or rales.  Chest:     Chest wall: No tenderness.  Abdominal:     Palpations: Abdomen is soft.     Tenderness: There is no abdominal tenderness. There is no right CVA tenderness or left CVA tenderness.  Musculoskeletal:        General: Tenderness present. No swelling or signs of injury.     Cervical back: Normal, normal range of motion and neck supple. No rigidity or tenderness.     Thoracic back: Normal.     Lumbar back: Spasms and tenderness present. No bony tenderness. Decreased range of motion. Negative right straight leg raise test and negative left straight leg raise test.       Back:     Right lower leg: No edema.     Left lower leg: No edema.     Comments: Tenderness to palpation of left sacroiliac joint.  Pain to paraspinal muscles of lower lumbar spine with palpation.  Decreased range of motion with forward and bilateral flexion at the hips particularly with lateral flexion to the right at the hips.  No ecchymosis, warmth, or redness noted to site of most tenderness.  Skin:    General: Skin is warm and dry.     Capillary Refill: Capillary refill takes less than 2 seconds.     Findings: No rash.  Neurological:     General: No focal deficit present.     Mental Status: She is alert and oriented to person, place, and time. Mental status is at baseline.     Motor: No weakness.     Gait: Gait normal.  Psychiatric:        Mood and Affect: Mood normal.        Behavior: Behavior normal. Behavior is cooperative.        Thought Content: Thought content normal.        Judgment: Judgment normal.     UC Treatments / Results  Labs (all labs ordered are listed, but only abnormal results are displayed) Labs Reviewed - No data to  display  EKG   Radiology No results found.  Procedures Procedures (including critical care time)  Medications Ordered in UC Medications - No data to display  Initial Impression / Assessment and Plan / UC Course  I have reviewed the triage vital signs and the nursing notes.  Pertinent labs & imaging results that were available during my care of the patient were reviewed by me and considered in my medical decision making (see chart for details).  Patient is a 41 year old female presenting to urgent care with acute low back muscular pain that is likely due to overuse.  She has had this pain in the past and it has been successfully treated with anti-inflammatory medications as well as muscle relaxers.  She is nontender to palpation of her spine.  Deferred imaging based on stable physical exam and vital signs today.  Pain improved with range of motion exercises in clinic today and patient states "it feels good to stretch".  Offered patient a steroid injection for suspected sciatica due to left-sided sciatic nerve pain.  Patient declined at this time and states that she would rather wait to see if  her pain becomes worse before taking medication for pain.  States she will go home and drink herbal tea to see if her pain improves with anti-inflammatory properties of herbs.  Recommend patient takes ibuprofen 600 mg twice daily at minimum for the next 3 to 4 days for acute inflammation that is causing low back pain to flareup.  Encourage patient to use her home supply of Robaxin that she has leftover from the last time she had a lumbar strain at night for muscle spasm relief.  She is also to perform gentle stretches and range of motion exercises.  Heat will also help muscle strain at home.  Counseled patient regarding appropriate use of medications and potential side effects for all medications recommended or prescribed today. Discussed red flag signs and symptoms of worsening condition,when to call the  PCP office, return to urgent care, and when to seek higher level of care. Patient verbalizes understanding and agreement with plan. All questions answered. Patient discharged in stable condition.  Final Clinical Impressions(s) / UC Diagnoses   Final diagnoses:  Strain of lumbar region, initial encounter     Discharge Instructions      Take ibuprofen 600mg  twice daily for the next 4-5 days for inflammation. Use heating pad to back and perform gentle stretches. Use robaxin for muscle spasm at night. Do not take this medication and drive/drink alcohol.  If you develop any new or worsening symptoms or do not improve in the next 2 to 3 days, please return.  If your symptoms are severe, please go to the emergency room.  Follow-up with your primary care provider for further evaluation and management of your symptoms as well as ongoing wellness visits.  I hope you feel better!    ED Prescriptions   None    PDMP not reviewed this encounter.   Talbot Grumbling, [place] 12/15/21 1756

## 2021-12-15 NOTE — ED Triage Notes (Signed)
Pt c/o lower back pains that arent getting better over the past 2 days. Denies falls, injury, bowel or bladder problems.

## 2021-12-26 ENCOUNTER — Encounter: Payer: Self-pay | Admitting: *Deleted

## 2022-01-01 ENCOUNTER — Telehealth: Payer: Self-pay

## 2022-01-01 DIAGNOSIS — B3731 Acute candidiasis of vulva and vagina: Secondary | ICD-10-CM

## 2022-01-01 MED ORDER — FLUCONAZOLE 150 MG PO TABS: 150.0000 mg | ORAL_TABLET | Freq: Once | ORAL | 0 refills | Status: AC

## 2022-01-01 NOTE — Telephone Encounter (Signed)
Pt called stating she has a yeast infection due to taking Flagyl.  Sent one time dose of Diflucan per protocol to [facility] on [address].

## 2022-02-14 ENCOUNTER — Telehealth: Payer: Self-pay

## 2022-02-14 NOTE — Telephone Encounter (Signed)
VM left on nurse line stating she is a patient of Dr. Debroah Loop and needs medication for BV. Phone call routed to [facility] location where patient is currently seen.

## 2022-02-23 ENCOUNTER — Other Ambulatory Visit: Payer: Self-pay

## 2022-02-23 ENCOUNTER — Ambulatory Visit (INDEPENDENT_AMBULATORY_CARE_PROVIDER_SITE_OTHER): Payer: BLUE CROSS/BLUE SHIELD | Admitting: Student

## 2022-02-23 VITALS — BP 120/88 | HR 71 | Ht 66.0 in | Wt 188.0 lb

## 2022-02-23 DIAGNOSIS — B9689 Other specified bacterial agents as the cause of diseases classified elsewhere: Secondary | ICD-10-CM | POA: Diagnosis not present

## 2022-02-23 DIAGNOSIS — N898 Other specified noninflammatory disorders of vagina: Secondary | ICD-10-CM | POA: Diagnosis not present

## 2022-02-23 DIAGNOSIS — N760 Acute vaginitis: Secondary | ICD-10-CM | POA: Diagnosis not present

## 2022-02-23 LAB — POCT WET PREP (WET MOUNT)
Clue Cells Wet Prep Whiff POC: POSITIVE
Trichomonas Wet Prep HPF POC: ABSENT

## 2022-02-23 MED ORDER — METRONIDAZOLE 500 MG PO TABS: 500.0000 mg | ORAL_TABLET | Freq: Two times a day (BID) | ORAL | 0 refills | Status: DC

## 2022-02-23 MED ORDER — FLUCONAZOLE 150 MG PO TABS: 150.0000 mg | ORAL_TABLET | Freq: Once | ORAL | 0 refills | Status: AC

## 2022-02-23 NOTE — Assessment & Plan Note (Addendum)
Wet prep performed, clue cells with positive whiff test. Rx for Flagyl 500mg  twice daily x7 days. Diflucan 150mg  x1 provided to take after finishing Flagyl for frequent yeast development that she faces after treatment of BV. Will follow up with gynecology regarding frequency of BV development.

## 2022-02-23 NOTE — Patient Instructions (Signed)
It was great to see you today! Thank you for choosing [facility] for your primary care. Ruth [name] was seen for vaginal irritation.  Today we addressed: Your wet prep was positive for bacterial vaginosis.  I prescribed Flagyl 5 mg twice daily x7 days.  Also prescribed Diflucan to take after you finish the Flagyl.  You have had BV 4 times in the last 5 months upon chart review so that your gynecologist has this information.  If you haven't already, sign up for My Chart to have easy access to your labs results, and communication with your primary care physician.  You should return to our clinic Return if symptoms worsen or fail to improve.  I recommend that you always bring your medications to each appointment as this makes it easy to ensure you are on the correct medications and helps [place] not miss refills when you need them.  Please arrive 15 minutes before your appointment to ensure smooth check in process.  We appreciate your efforts in making this happen.  Please call the clinic at [phone] if your symptoms worsen or you have any concerns.  Thank you for allowing me to participate in your care, [place] Mattocks, DO 02/23/2022, 12:11 PM PGY-2, [facility]

## 2022-02-23 NOTE — Progress Notes (Signed)
  SUBJECTIVE:   CHIEF COMPLAINT / HPI:   Concern for BV: states she gets it often around her menstrual cycle. She gets irritation after finishing her menstrual cycle. LMP 7/28. Some itching, smell/odor, discharge. Metronidazole has worked for her regularly in the past. Usually gets a yeast pill as well because she develops yeast after being treated.  No concern for STDs or pregnancy. Tubal ligation.   PERTINENT  PMH / PSH: HTN, asthma, GERD  OBJECTIVE:  BP 120/88   Pulse 71   Ht 5\' 6"  (1.676 m)   Wt 188 lb (85.3 kg)   SpO2 98%   BMI 30.34 kg/m   General: NAD, pleasant, able to participate in exam Pelvic exam: normal external genitalia, vulva, cervix, uterus and adnexa, VAGINA: normal appearing vagina with normal color, no lesions, vaginal discharge - white, grey, and malodorous, exam chaperoned by  ASSESSMENT/PLAN:  Bacterial vaginosis Wet prep performed, clue cells with positive whiff test. Rx for Flagyl 500mg  twice daily x7 days. Diflucan 150mg  x1 provided to take after finishing Flagyl for frequent yeast development that she faces after treatment of BV. Will follow up with gynecology regarding frequency of BV development.    Return if symptoms worsen or fail to improve. Jone Baseman, DO 02/23/2022, 1:58 PM PGY-2, [facility]

## 2022-02-26 ENCOUNTER — Ambulatory Visit: Payer: BLUE CROSS/BLUE SHIELD

## 2022-02-28 ENCOUNTER — Ambulatory Visit: Payer: BLUE CROSS/BLUE SHIELD

## 2022-02-28 NOTE — Progress Notes (Unsigned)
Canceled appt.

## 2022-03-27 ENCOUNTER — Ambulatory Visit (INDEPENDENT_AMBULATORY_CARE_PROVIDER_SITE_OTHER): Payer: BLUE CROSS/BLUE SHIELD | Admitting: Family Medicine

## 2022-03-27 VITALS — BP 133/87 | HR 74 | Ht 66.0 in | Wt 188.8 lb

## 2022-03-27 DIAGNOSIS — M5431 Sciatica, right side: Secondary | ICD-10-CM

## 2022-03-27 DIAGNOSIS — M5430 Sciatica, unspecified side: Secondary | ICD-10-CM | POA: Insufficient documentation

## 2022-03-27 MED ORDER — METHYLPREDNISOLONE 4 MG PO TBPK: ORAL_TABLET | ORAL | 0 refills | Status: DC

## 2022-03-27 NOTE — Assessment & Plan Note (Signed)
On right side since 9/1. Reports flares in the past. She had significant discomfort in sitting, laying down. She still has normal gait and strength though ROM is limited 2/2 pain. History and examination most consistent with sciatica. Has not had improvement with NSAIDs.  -Rx medrol dose-pak -Referral to PT -Discussed proper lifting techniques -Handout provided on sciatica

## 2022-03-27 NOTE — Progress Notes (Signed)
    SUBJECTIVE:   CHIEF COMPLAINT / HPI:   Ruth [name] is a 41 y.o. female who presents to the [facility] clinic today to discuss the following concerns:   Back Pain Symptoms started on Friday, 9/1. Going down right-side of back and shooting to right buttock. Hard to get comfortable. Hard to sleep due to the pain. Took 600 mg Ibuprofen for the pain every 4 hours, was not helping so she stopped. Has had similar sx in the past- was told it was likely sciatica. 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 Did not make it to work last night.   PERTINENT  PMH / PSH: Sacroiliitis  OBJECTIVE:   BP 133/87   Pulse 74   Ht 5\' 6"  (1.676 m)   Wt 188 lb 12.8 oz (85.6 kg)   SpO2 100%   BMI 30.47 kg/m    General: In distress, expressing pain with movement- hard to get comfortable  Respiratory: Normal respiratory effort on room air MSK: Positive straight leg raise on right. Pain on palpation of right side low back with radiation to right glute. Normal strength b/l lower extremities though limited ROM 2/2 pain  Neuro: alert, antalgic gait 2/2 pain, normal strength, sensation b/l LE   ASSESSMENT/PLAN:   Sciatica On right side since 9/1. Reports flares in the past. She had significant discomfort in sitting, laying down. She still has normal gait and strength though ROM is limited 2/2 pain. History and examination most consistent with sciatica. Has not had improvement with NSAIDs.  -Rx medrol dose-pak -Referral to PT -Discussed proper lifting techniques -Handout provided on sciatica     11/1, DO [facility]

## 2022-03-27 NOTE — Patient Instructions (Addendum)
It was wonderful to see you today.  Please bring ALL of your medications with you to every visit.   Today we talked about:  This is likely sciatica. I am sending in a steroid dose-pak. Take as instructed.  I am sending a referral to Physical therapy.  See below for additional instructions.    Thank you for choosing [facility].   Please call [phone] with any questions about today's appointment.  Please be sure to schedule follow up at the front  desk before you leave today.   Sabino Dick, DO PGY-3 Family Medicine    Sciatica  Sciatica is pain, weakness, tingling, or loss of feeling (numbness) along the sciatic nerve. The sciatic nerve starts in the lower back and goes down the back of each leg. Sciatica usually affects one side of the body. Sciatica usually goes away on its own or with treatment. Sometimes, sciatica may come back. What are the causes? This condition happens when the sciatic nerve is pinched or has pressure put on it. This may be caused by: A disk in between the bones of the spine bulging out too far (herniated disk). Changes in the spinal disks due to aging. A condition that affects a muscle in the butt. Extra bone growth near the sciatic nerve. A break (fracture) of the area between your hip bones (pelvis). Pregnancy. Tumor. This is rare. What increases the risk? You are more likely to develop this condition if you: Play sports that put pressure or stress on the spine. Have poor strength and ease of movement (flexibility). Have had a back injury or back surgery. Sit for long periods of time. Do activities that involve bending or lifting over and over again. Are very overweight (obese). What are the signs or symptoms? Symptoms can vary from mild to very bad. They may include: Any of these problems in the lower back, leg, hip, or butt: Mild tingling, loss of feeling, or dull aches. A burning feeling. Sharp pains. Loss of  feeling in the back of the calf or the sole of the foot. Leg weakness. Very bad back pain that makes it hard to move. These symptoms may get worse when you cough, sneeze, or laugh. They may also get worse when you sit or stand for long periods of time. How is this treated? This condition often gets better without any treatment. However, treatment may include: Changing or cutting back on physical activity when you have pain. Exercising, including strengthening and stretching. Putting ice or heat on the affected area. Shots of medicines to relieve pain and swelling or to relax your muscles. Surgery. Follow these instructions at home: Medicines Take over-the-counter and prescription medicines only as told by your doctor. Ask your doctor if you should avoid driving or using machines while you are taking your medicine. Managing pain     If told, put ice on the affected area. To do this: Put ice in a plastic bag. Place a towel between your skin and the bag. Leave the ice on for 20 minutes, 2-3 times a day. If your skin turns bright red, take off the ice right away to prevent skin damage. The risk of skin damage is higher if you cannot feel pain, heat, or cold. If told, put heat on the affected area. Do this as often as told by your doctor. Use the heat source that your doctor tells you to use, such as a moist heat pack or a heating pad. Place a towel between  your skin and the heat source. Leave the heat on for 20-30 minutes. If your skin turns bright red, take off the heat right away to prevent burns. The risk of burns is higher if you cannot feel pain, heat, or cold. Activity  Return to your normal activities when your doctor says that it is safe. Avoid activities that make your symptoms worse. Take short rests during the day. When you rest for a long time, do some physical activity or stretching between periods of rest. Avoid sitting for a long time without moving. Get up and move  around at least one time each hour. Do exercises and stretches as told by your doctor. Do not lift anything that is heavier than 10 lb (4.5 kg). Avoid lifting heavy things even when you do not have symptoms. Avoid lifting heavy things over and over. When you lift objects, always lift in a way that is safe for your body. To do this, you should: Bend your knees. Keep the object close to your body. Avoid twisting. General instructions Stay at a healthy weight. Wear comfortable shoes that support your feet. Avoid wearing high heels. Avoid sleeping on a mattress that is too soft or too hard. You might have less pain if you sleep on a mattress that is firm enough to support your back. Contact a doctor if: Your pain is not controlled by medicine. Your pain does not get better. Your pain gets worse. Your pain lasts longer than 4 weeks. You lose weight without trying. Get help right away if: You cannot control when you pee (urinate) or poop (have a bowel movement). You have weakness in any of these areas and it gets worse: Lower back. The area between your hip bones. Butt. Legs. You have redness or swelling of your back. You have a burning feeling when you pee. Summary Sciatica is pain, weakness, tingling, or loss of feeling (numbness) along the sciatic nerve. This may include the lower back, legs, hips, and butt. This condition happens when the sciatic nerve is pinched or has pressure put on it. Treatment often includes rest, exercise, medicines, and putting ice or heat on the affected area. This information is not intended to replace advice given to you by your health care provider. Make sure you discuss any questions you have with your health care provider. Document Revised: 10/16/2021 Document Reviewed: 10/16/2021 Elsevier Patient Education  2023 [employer].

## 2022-04-06 ENCOUNTER — Ambulatory Visit: Payer: BLUE CROSS/BLUE SHIELD | Admitting: Physical Therapy

## 2022-04-27 ENCOUNTER — Encounter: Payer: Self-pay | Admitting: Obstetrics and Gynecology

## 2022-04-27 ENCOUNTER — Ambulatory Visit (INDEPENDENT_AMBULATORY_CARE_PROVIDER_SITE_OTHER): Payer: BLUE CROSS/BLUE SHIELD | Admitting: Obstetrics and Gynecology

## 2022-04-27 VITALS — BP 117/83 | HR 76 | Ht 66.0 in | Wt 190.0 lb

## 2022-04-27 DIAGNOSIS — Z01419 Encounter for gynecological examination (general) (routine) without abnormal findings: Secondary | ICD-10-CM

## 2022-04-27 DIAGNOSIS — N760 Acute vaginitis: Secondary | ICD-10-CM | POA: Diagnosis not present

## 2022-04-27 NOTE — Addendum Note (Signed)
Addended by: Cindi Carbon on: 04/27/2022 09:40 AM   Modules accepted: Orders

## 2022-04-27 NOTE — Progress Notes (Signed)
Patient presents for AEX.  Last Pap: 12/12/2021- normal  Last Mammogram: Has not had one, due today.  Cycles/ Contraception: Tubal Ligation 2018, ablation 2022.  Vaginal/Urinary Symptoms: Reports vaginal irritation and discharge, recurrent bacterial vaginosis after cycles.  STD Screen: Desires vaginal swab, Desires HIV bloodwork  Other Concerns:

## 2022-04-27 NOTE — Progress Notes (Signed)
ANNUAL EXAM [name] MRN [id number]  Date of birth: 1981/06/13 Chief Complaint:   Annual exam STI screening Recurrent BV History of Present Illness:   Ruth [name] is a 41 y.o. [id number] being seen today for a routine annual exam.   Patient continues to have cycles, not as long but cramping has returned since having had ablation. Frequent BV infections, typically after menses though none after last menstrual cycle. Stopped using perfumed products on vaginal area. Notes recent epsom salt bath with recurrence of irritation. Interested in treatments - states can't use vaginal tx for BV due to immediate yeast infection. Currently sexually active with female partner. No pain with intercourse but has noticed back pain following. No breast complaints - no mammo done due to anxiety about pain with procedure. No issues on self breast exams.   Patient's last menstrual period was 04/07/2022.  S/p endometrial ablation S/p filshie clips for tubal ligation as contraception  Last pap 11/2021. Results were: NILM w/ HRHPV negative with flora shift Last mammogram: not done - order today  Last colonoscopy: n/a     03/27/2022    4:39 PM 02/23/2022   11:15 AM 12/12/2021    1:41 PM 11/16/2021    9:46 AM 09/15/2021    2:44 PM  Depression screen PHQ 2/9  Decreased Interest 0 0 0 0 0  Down, Depressed, Hopeless 0 0 0 0 0  PHQ - 2 Score 0 0 0 0 0  Altered sleeping 1 0 0 0 0  Tired, decreased energy 0 0 0 0 1  Change in appetite 0 0 0 0 0  Feeling bad or failure about yourself  0 0 0 0 0  Trouble concentrating 0 0 0 0 0  Moving slowly or fidgety/restless 0 0 0 0 0  Suicidal thoughts 0 0 0 0 0  PHQ-9 Score 1 0 0 0 1  Difficult doing work/chores Not difficult at all  Not difficult at all Not difficult at all         10/24/2020    2:00 PM 08/03/2020    9:01 AM 06/08/2020    9:34 AM 06/06/2016    9:52 AM  GAD 7 : Generalized Anxiety Score  Nervous, Anxious, on Edge 0 0 0 0  Control/stop  worrying 0 0 0 0  Worry too much - different things 0 1 0 0  Trouble relaxing 0 0 1 0  Restless 0 0 0 0  Easily annoyed or irritable 0 0 0 0  Afraid - awful might happen 0 0 0 0  Total GAD 7 Score 0 1 1 0     Review of Systems:   Pertinent items are noted in HPI Denies any headaches, blurred vision, fatigue, shortness of breath, chest pain, abdominal pain, abnormal vaginal discharge/itching/odor/irritation, problems with periods, bowel movements, urination, or intercourse unless otherwise stated above. Pertinent History Reviewed:  Reviewed past medical,surgical, social and family history.  Reviewed problem list, medications and allergies. Physical Assessment:   Vitals:   04/27/22 0824  Weight: 190 lb (86.2 kg)  Height: 5\' 6"  (1.676 m)  There is no height or weight on file to calculate BMI.        Physical Examination:   General appearance - well appearing, and in no distress  Mental status - alert, oriented to person, place, and time  Psych:  She has a normal mood and affect  Skin - warm and dry, normal color, no suspicious lesions noted  Chest -  effort normal, all lung fields clear to auscultation bilaterally  Heart - normal rate and regular rhythm  Abdomen - soft, nontender, nondistended, no masses or organomegaly  Pelvic - VULVA: normal appearing vulva with no masses, tenderness or lesions  VAGINA: normal appearing vagina with normal color and discharge, no lesions  CERVIX: normal appearing cervix without discharge or lesions, no CMT. No tenderness of bilateral levator ani or posterior vaginal vault  UTERUS: uterus is felt to be normal size, shape, consistency and nontender   ADNEXA: No adnexal masses or tenderness noted.  Extremities:  No swelling or varicosities noted  Chaperone present for exam  No results found for this or any previous visit (from the past 24 hour(s)).  Assessment & Plan:  1. Well woman exam with routine gynecological exam STI screening today -  Cervicovaginal ancillary only( [place]) - HIV Antibody (routine testing w rflx) - RPR - Hepatitis B Surface AntiGEN - Hepatitis C Antibody - MM Digital Diagnostic Bilat; Future  2. Acute vaginitis Recurrent BV - recheck today Discussed suppressive infection therapy vs. Menstrual suppression Pt expressed interest in hysterectomy due to cramping and recurrent BV but would prefer to solve BV without needing surgery or hormonal contraception if able  Discussed douche with plain water following cycle, vulvar care, cotton pads and under  Orders Placed This Encounter  Procedures   MM Digital Diagnostic Bilat   HIV Antibody (routine testing w rflx)   RPR   Hepatitis B Surface AntiGEN   Hepatitis C Antibody     Follow-up: pending vaginitis screen   Lorriane Shire, MD 04/27/2022 8:20 AM

## 2022-05-30 ENCOUNTER — Ambulatory Visit: Payer: BLUE CROSS/BLUE SHIELD

## 2022-06-07 ENCOUNTER — Other Ambulatory Visit (HOSPITAL_COMMUNITY)
Admission: RE | Admit: 2022-06-07 | Discharge: 2022-06-07 | Disposition: A | Discharge status: Home / Self Care | Payer: BLUE CROSS/BLUE SHIELD | Source: Ambulatory Visit | Attending: Family Medicine | Admitting: Family Medicine

## 2022-06-07 ENCOUNTER — Ambulatory Visit (INDEPENDENT_AMBULATORY_CARE_PROVIDER_SITE_OTHER): Payer: BLUE CROSS/BLUE SHIELD | Admitting: Student

## 2022-06-07 ENCOUNTER — Telehealth: Payer: Self-pay | Admitting: Family Medicine

## 2022-06-07 VITALS — BP 132/86 | HR 74 | Ht 66.0 in | Wt 195.0 lb

## 2022-06-07 DIAGNOSIS — N898 Other specified noninflammatory disorders of vagina: Secondary | ICD-10-CM

## 2022-06-07 DIAGNOSIS — M5431 Sciatica, right side: Secondary | ICD-10-CM

## 2022-06-07 DIAGNOSIS — Z23 Encounter for immunization: Secondary | ICD-10-CM

## 2022-06-07 DIAGNOSIS — R519 Headache, unspecified: Secondary | ICD-10-CM | POA: Diagnosis not present

## 2022-06-07 LAB — POCT WET PREP (WET MOUNT)
Clue Cells Wet Prep Whiff POC: POSITIVE
Trichomonas Wet Prep HPF POC: ABSENT
WBC, Wet Prep HPF POC: NONE SEEN

## 2022-06-07 MED ORDER — SUMATRIPTAN SUCCINATE 50 MG PO TABS: 50.0000 mg | ORAL_TABLET | ORAL | 0 refills | Status: DC | PRN

## 2022-06-07 MED ORDER — FLUCONAZOLE 150 MG PO TABS: 150.0000 mg | ORAL_TABLET | Freq: Once | ORAL | 0 refills | Status: AC

## 2022-06-07 MED ORDER — METRONIDAZOLE 500 MG PO TABS: 500.0000 mg | ORAL_TABLET | Freq: Two times a day (BID) | ORAL | 0 refills | Status: AC

## 2022-06-07 MED ORDER — PROCHLORPERAZINE MALEATE 5 MG PO TABS: 5.0000 mg | ORAL_TABLET | Freq: Four times a day (QID) | ORAL | 0 refills | Status: DC | PRN

## 2022-06-07 NOTE — Telephone Encounter (Signed)
I called to discuss the patient's visit with Dr. Laroy Apple today.  Call participants: Drs. Lucius Conn and Schimek  It appears that the patient was upset that her visits focused more on back pain and headache and not BV screening and treatment, even though her visit note suggested she needed to be seen for headache and MSK pain.  The patient received feedback on Dr. Karen Kitchens provision of safe and compassionate care and our zero-tolerance policy for patient disrespect to our staff and providers.  A PCP switch was also discussed, and she indicated that her actual PCP might have been a provider she had communicated with recently, likely Dr. Melba Coon (this is not so).  Regardless, we will proceed with the switch.

## 2022-06-07 NOTE — Patient Instructions (Signed)
It was great to see you! Thank you for allowing me to participate in your care!   Our plans for today:  - I have sent in compazine Return to care if: Numbness or tingling in your arms or legs. Changes in your speech or vision. - Please keep a headache diary - Wet prep results will be discussed  Take care and seek immediate care sooner if you develop any concerns.  Levin Erp, MD

## 2022-06-07 NOTE — Assessment & Plan Note (Signed)
Patient has some left-sided back pain that radiates into the thigh.  She has not started physical therapy yet.  I offered Toradol shot to her today which she declines.  She does not have any red flag symptoms on examination today. -Encouraged PT -Return precautions discussed

## 2022-06-07 NOTE — Assessment & Plan Note (Addendum)
Patient has been having headache on and off for 7 days.  Neurologic examination within normal limits and no emesis making intracranial process less likely.  Most likely migraine given patient has had some vision blurriness and seeing stars at times although does not have significant history of this.  Medication induced headache possible as well as patient is taking NSAIDs/Goody powders for back/leg pain and this could have affected this as well.  At first I sent in Compazine for patient but she expressed dissatisfaction with this as she believes this to be a psychiatric medication/only treating nausea medication which I discussed is not the case to however I agreed to discontinue this medication.  I let her know I can send in a trial of sumatriptan for her as she does not have significant cardiac issues and this is most likely related to a migraine.  Says she would also like a work excuse for the past week that she has been out.  Believes her headaches are due to dehydration and I discussed we will get a BMP to evaluate although does not appear acutely dehydrated on examination which I discussed with patient. I offered toradol shot to patient for pain control of headache and lower back pain which she declined.  -Return precautions discussed with patient  -Sumatriptan Rx sent -Work excuse note provided for today 11/16 and 11/17 -BMP, A1c

## 2022-06-07 NOTE — Addendum Note (Signed)
Addended by: Jone Baseman D on: 06/07/2022 02:14 PM   Modules accepted: Orders

## 2022-06-07 NOTE — Progress Notes (Cosign Needed Addendum)
SUBJECTIVE:   CHIEF COMPLAINT / HPI: Headache  Headache: Headache started last Wednesday. Feels like pressure. First it started out on left side of her head and then on yesterday and today went to the right side.  She says sometimes it moves to the front.  She says she does not usually have headaches but she has had a similar one when she had COVID in 1 time before that. At work last Thursday she got dizzy and almost threw up but denies any vomiting. Seeing stars from time to time.  Denies any vomiting but sometimes has nausea.  There is whether these headaches are due to dehydration. Gatorade and IV pack not helping. Drinks some green tea from time to time but no other caffeine. Sometimes has vision blurriness and sees stars but not right now. She says the blurriness is not constant or all day and worse when standing up. Taking BC powders, ibuprofen, tylenol without pain control. Last two days haven't taking anything. BC powders was taking it twice a day but wasn't helping. Went home early last Wednesday from work due to this and has been out of work since then. No weakness of extremities/sensations loss. 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  Left lower back and leg pain: Was given a Medrol Dosepak on 03/27/2022 due to back pain and she said she took 2 days of it and stopped taking it after it felt better.  She does not want to try steroid pack today.  She has not started her physical therapy appointment and does not have transportation but she is planning on starting soon.  Denies any saddle anesthesia, leg weakness or sensation differences.  Vaginal Discharge: Upon handing AVS to patient she expressed dissatisfaction and visit and that she mainly wanted to get her checked for bacterial vaginosis or medication sent in.  I asked tummy time she has had BV in the past year and she says many times.  I asked whether metronidazole gel would be an option for  her and she says that it "tears her vagina up" and she does not want to take that.  She said that she started her get a vaginal exam or have medications sent to her without the exam.  Discussed we do not have time for this today given her main issue of headaches and back pain.  She said she would not leave until she received either treatment or examination.  PERTINENT  PMH / PSH: sciatica  OBJECTIVE:   BP 132/86   Pulse 74   Ht 5\' 6"  (1.676 m)   Wt 195 lb (88.5 kg)   LMP 05/27/2022   SpO2 98%   BMI 31.47 kg/m   General: NAD, awake, alert, responsive to questions Head: Normocephalic atraumatic Neuro: CN II: PERRL CN III, IV,VI: EOMI CV V: Normal sensation in V1, V2, V3 CVII: Symmetric smile and brow raise CN VIII: Normal hearing CN IX,X: Symmetric palate raise  CN XI: 5/5 shoulder shrug CN XII: Symmetric tongue protrusion  UE and LE strength 5/5 Normal sensation in UE and LE bilaterally  CV: Regular rate and rhythm no murmurs rubs or gallops Respiratory: Clear to ausculation bilaterally, no wheezes rales or crackles, chest rises symmetrically,  no increased work of breathing Abdomen: Soft, non-tender, non-distended, normoactive bowel sounds  Extremities: Moves upper and lower extremities freely, no edema in LE Back: no spinal process tenderness, mild tenderness left lower back Skin: No rashes or  lesions visualized  Pelvic: VULVA: normal appearing vulva with no masses, tenderness or lesions, VAGINA: Normal appearing vagina with normal color, no lesions, with copious and thin discharge present, CERVIX: No lesions, white and thin discharge present  Chaperone Shon Baton CMA present for pelvic exam   ASSESSMENT/PLAN:   Acute nonintractable headache Patient has been having headache on and off for 7 days.  Neurologic examination within normal limits and no emesis making intracranial process less likely.  Most likely migraine given patient has had some vision blurriness and seeing  stars at times although does not have significant history of this.  Medication induced headache possible as well as patient is taking NSAIDs/Goody powders for back/leg pain and this could have affected this as well.  At first I sent in Compazine for patient but she expressed dissatisfaction with this as she believes this to be a psychiatric medication/only treating nausea medication which I discussed is not the case to however I agreed to discontinue this medication.  I let her know I can send in a trial of sumatriptan for her as she does not have significant cardiac issues and this is most likely related to a migraine.  Says she would also like a work excuse for the past week that she has been out.  Believes her headaches are due to dehydration and I discussed we will get a BMP to evaluate although does not appear acutely dehydrated on examination which I discussed with patient. I offered toradol shot to patient for pain control of headache and lower back pain which she declined.  -Return precautions discussed with patient  -Sumatriptan Rx sent -Work excuse note provided for today 11/16 and 11/17 -BMP, A1c  Vaginal discharge Patient complaining of vaginal discharge at end of visit. Wet prep performed today as well as G/C. Wet prep consistent with BV. We discussed metronidazole gel which she declined.  -Metronidazole BID for 7 days -Fluconazole x 1 after course  Sciatica Patient has some left-sided back pain that radiates into the thigh.  She has not started physical therapy yet.  I offered Toradol shot to her today which she declines.  She does not have any red flag symptoms on examination today. -Encouraged PT -Return precautions discussed  Patient expressed dissatisfaction in visit today as I originally only went over her headache and lower back pain and she brought up her vaginal discharge at the end of the visit.  She said that she would like everything to be performed today as she cannot come  back multiple times.  I was able to perform her pelvic examination and provide her with medications for this at this visit.  She still expressed dissatisfaction after this was performed and believes that her headaches are due to dehydration.  I discussed that we are obtaining a BMP to evaluate her electrolytes as well.  She expressed dissatisfaction in timing of visit and listening to her problems.  I expressed that I am glad that her neurologic examination was benign and I am less worried about an intracranial process at this time.  Left unsatisfied and I offered speaking with medical director via phone call. I have discussed with attending Dr. Deirdre Priest and Dr. Lum Babe about the situation.   Levin Erp, MD [facility]

## 2022-06-07 NOTE — Assessment & Plan Note (Signed)
Patient complaining of vaginal discharge at end of visit. Wet prep performed today as well as G/C. Wet prep consistent with BV. We discussed metronidazole gel which she declined.  -Metronidazole BID for 7 days -Fluconazole x 1 after course

## 2022-06-08 LAB — BASIC METABOLIC PANEL
BUN/Creatinine Ratio: 9 (ref 9–23)
BUN: 7 mg/dL (ref 6–24)
CO2: 25 mmol/L (ref 20–29)
Calcium: 9.2 mg/dL (ref 8.7–10.2)
Chloride: 103 mmol/L (ref 96–106)
Creatinine, Ser: 0.74 mg/dL (ref 0.57–1.00)
Glucose: 90 mg/dL (ref 70–99)
Potassium: 4.2 mmol/L (ref 3.5–5.2)
Sodium: 139 mmol/L (ref 134–144)
eGFR: 104 mL/min/{1.73_m2} (ref >59)

## 2022-06-08 LAB — HIV ANTIBODY (ROUTINE TESTING W REFLEX): HIV Screen 4th Generation wRfx: NONREACTIVE

## 2022-06-08 LAB — RPR: RPR Ser Ql: NONREACTIVE

## 2022-06-08 NOTE — Telephone Encounter (Signed)
Certified warning letter sent regarding [employer] and a copy of or dismissal policy attached.

## 2022-06-11 LAB — CERVICOVAGINAL ANCILLARY ONLY
Chlamydia: NEGATIVE
Comment: NEGATIVE
Comment: NORMAL
Neisseria Gonorrhea: NEGATIVE

## 2022-07-14 ENCOUNTER — Encounter: Payer: Self-pay | Admitting: Family Medicine

## 2023-01-24 ENCOUNTER — Other Ambulatory Visit: Payer: Self-pay

## 2023-01-24 ENCOUNTER — Encounter (HOSPITAL_COMMUNITY): Payer: Self-pay | Admitting: Emergency Medicine

## 2023-01-24 ENCOUNTER — Ambulatory Visit (HOSPITAL_COMMUNITY)
Admission: EM | Admit: 2023-01-24 | Discharge: 2023-01-24 | Disposition: A | Discharge status: Home / Self Care | Payer: BLUE CROSS/BLUE SHIELD | Attending: Emergency Medicine | Admitting: Emergency Medicine

## 2023-01-24 DIAGNOSIS — B379 Candidiasis, unspecified: Secondary | ICD-10-CM | POA: Insufficient documentation

## 2023-01-24 DIAGNOSIS — N760 Acute vaginitis: Secondary | ICD-10-CM | POA: Insufficient documentation

## 2023-01-24 DIAGNOSIS — R1030 Lower abdominal pain, unspecified: Secondary | ICD-10-CM | POA: Diagnosis not present

## 2023-01-24 DIAGNOSIS — N898 Other specified noninflammatory disorders of vagina: Secondary | ICD-10-CM | POA: Insufficient documentation

## 2023-01-24 DIAGNOSIS — B9689 Other specified bacterial agents as the cause of diseases classified elsewhere: Secondary | ICD-10-CM | POA: Diagnosis not present

## 2023-01-24 DIAGNOSIS — T3695XA Adverse effect of unspecified systemic antibiotic, initial encounter: Secondary | ICD-10-CM | POA: Insufficient documentation

## 2023-01-24 LAB — POCT URINALYSIS DIP (MANUAL ENTRY)
Bilirubin, UA: NEGATIVE
Blood, UA: NEGATIVE
Glucose, UA: NEGATIVE mg/dL
Ketones, POC UA: NEGATIVE mg/dL
Leukocytes, UA: NEGATIVE
Nitrite, UA: NEGATIVE
Protein Ur, POC: NEGATIVE mg/dL
Spec Grav, UA: 1.02 (ref 1.010–1.025)
Urobilinogen, UA: 0.2 E.U./dL
pH, UA: 8.5 — AB (ref 5.0–8.0)

## 2023-01-24 LAB — POCT URINE PREGNANCY: Preg Test, Ur: NEGATIVE

## 2023-01-24 MED ORDER — PREDNISONE 20 MG PO TABS: 40.0000 mg | ORAL_TABLET | Freq: Every day | ORAL | 0 refills | Status: DC

## 2023-01-24 MED ORDER — FLUCONAZOLE 150 MG PO TABS: 150.0000 mg | ORAL_TABLET | Freq: Once | ORAL | 0 refills | Status: DC | PRN

## 2023-01-24 MED ORDER — METRONIDAZOLE 500 MG PO TABS: 500.0000 mg | ORAL_TABLET | Freq: Two times a day (BID) | ORAL | 0 refills | Status: AC

## 2023-01-24 NOTE — ED Triage Notes (Signed)
Reports vaginal irritation and vaginal discharge.  Describes discharge from [place] to whitish.  This situation does follow menstrual cycle and patient is concerned for BV.  Reports abdominal cramping    Patient has taking tylenol and BC powders

## 2023-01-24 NOTE — Discharge Instructions (Addendum)
We will call you if anything on your swab returns positive. You can also see these results on MyChart. Please abstain from sexual intercourse until your results return.  In the meantime I am treating you for BV with Flagyl twice daily for 7 days, and fluconazole to prevent yeast infection.  Please call your OB/GYN for follow-up

## 2023-01-24 NOTE — ED Provider Notes (Signed)
[facility]    CSN: [id number] Arrival date & time: 01/24/23  1158     History   Chief Complaint Chief Complaint  Patient presents with   Vaginal Itching    HPI Ruth [name] is a 42 y.o. female.  Here with several day history of vaginal discharge and irritation [place]/white color Reports history of BV that occurs after her menstrual cycles LMP 6/19 (2 weeks ago). This feels similar. Having some lower abdominal cramping. 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   ASTHMA, INTERMITTENT 02/19/2007    Past Surgical History:  Procedure Laterality Date   CHOLECYSTECTOMY  10/31/2011   Procedure: LAPAROSCOPIC CHOLECYSTECTOMY;  Surgeon: Liz Malady, MD;  Location: [facility];  Service: General;  Laterality: N/A;   DILATION AND CURETTAGE OF UTERUS     ENDOMETRIAL ABLATION N/A 09/21/2020   Procedure: ENDOMETRIAL ABLATION WITH NOVASURE;  Surgeon: Adam Phenix, MD;  Location: [facility];  Service: Gynecology;  Laterality: N/A;   KNEE SURGERY Left 08/2014   LAPAROSCOPIC TUBAL LIGATION Bilateral 07/24/2016   Procedure: LAPAROSCOPIC TUBAL LIGATION;  Surgeon: Adam Phenix, MD;  Location: [facility];  Service: Gynecology;  Laterality: Bilateral;   WISDOM TOOTH EXTRACTION      OB History     Gravida  6   Para  4   Term  4   Preterm  0   AB  2   Living  4      SAB  2    IAB  0   Ectopic  0   Multiple  0   Live Births  4        Obstetric Comments  Have 4 children, vaginal deliveries           Home Medications    Prior to Admission medications   Medication Sig Start Date End Date Taking? Authorizing Provider  fluconazole (DIFLUCAN) 150 MG tablet Take 1 tablet (150 mg total) by mouth once as needed for up to 2 doses (take one pill on day 1, and the second pill 3 days later). 01/24/23  Yes Zakery Normington, Lurena Joiner, PA-C  metroNIDAZOLE (FLAGYL) 500 MG tablet Take 1 tablet (500 mg total) by mouth 2 (two) times daily for 7 days. 01/24/23 01/31/23 Yes Shacoria Latif, Lurena Joiner, PA-C  SUMAtriptan (IMITREX) 50 MG tablet Take 1 tablet (50 mg total) by mouth every 2 (two) hours as needed for migraine. May repeat in 2 hours if headache persists or recurs. 06/07/22   Levin Erp, MD    Family History Family History  Problem Relation Age of Onset   Drug abuse Mother    Congestive Heart Failure Mother    Cancer Father     Social History Social History   Tobacco Use   Smoking status: Former    Packs/day: .2    Types: Cigarettes, Cigars   Smokeless tobacco: Never  Vaping  Use   Vaping Use: Never used  Substance Use Topics   Alcohol use: Not Currently    Alcohol/week: 1.0 standard drink of alcohol    Types: 1 Glasses of wine per week    Comment: socially    Drug use: Not Currently    Types: Marijuana    Comment: occassionally      Allergies   Hydrocodone-acetaminophen   Review of Systems Review of Systems As per HPI  Physical Exam Triage Vital Signs ED Triage Vitals  Enc Vitals Group     BP      Pulse      Resp      Temp      Temp src      SpO2      Weight      Height      Head Circumference      Peak Flow      Pain Score      Pain Loc      Pain Edu?      Excl. in GC?    No data found.  Updated Vital Signs BP 126/86 (BP Location: Right Arm) Comment (BP Location): large cuff  Pulse 73   Temp 97.9 F (36.6 C) (Oral)   Resp 20   LMP  01/09/2023   SpO2 98%    Physical Exam Vitals and nursing note reviewed.  Constitutional:      General: She is not in acute distress.    Appearance: Normal appearance.  HENT:     Mouth/Throat:     Pharynx: Oropharynx is clear.  Cardiovascular:     Rate and Rhythm: Normal rate and regular rhythm.     Pulses: Normal pulses.     Heart sounds: Normal heart sounds.  Pulmonary:     Effort: Pulmonary effort is normal.     Breath sounds: Normal breath sounds.  Abdominal:     Palpations: Abdomen is soft.     Tenderness: There is no abdominal tenderness. There is no guarding or rebound.  Neurological:     Mental Status: She is alert and oriented to person, place, and time.    UC Treatments / Results  Labs (all labs ordered are listed, but only abnormal results are displayed) Labs Reviewed  POCT URINALYSIS DIP (MANUAL ENTRY) - Abnormal; Notable for the following components:      Result Value   pH, UA 8.5 (*)    All other components within normal limits  POCT URINE PREGNANCY  CERVICOVAGINAL ANCILLARY ONLY    EKG  Radiology No results found.  Procedures Procedures (including critical care time)  Medications Ordered in UC Medications - No data to display  Initial Impression / Assessment and Plan / UC Course  I have reviewed the triage vital signs and the nursing notes.  Pertinent labs & imaging results that were available during my care of the patient were reviewed by me and considered in my medical decision making (see chart for details).  UPT negative  UA unremarkable. No sign of infection  Cytology swab pending  With patient concern for BV will treat with flagyl BID x 7 days.  History of antibiotic associated yeast infection.  Sent 2 dose fluconazole.  Advised close follow up with ob/gyn if having recurrent symptoms. Patient agreeable to plan, no questions at this time  Final Clinical Impressions(s) / UC Diagnoses   Final diagnoses:  Vaginal discharge  BV  (bacterial vaginosis)  Antibiotic-induced yeast infection     Discharge Instructions  We will call you if anything on your swab returns positive. You can also see these results on MyChart. Please abstain from sexual intercourse until your results return.  In the meantime I am treating you for BV with Flagyl twice daily for 7 days, and fluconazole to prevent yeast infection.  Please call your OB/GYN for follow-up     ED Prescriptions     Medication Sig Dispense Auth. Provider   predniSONE (DELTASONE) 20 MG tablet  (Status: Discontinued) Take 2 tablets (40 mg total) by mouth daily with breakfast for 5 days. 10 tablet Orel Cooler, PA-C   metroNIDAZOLE (FLAGYL) 500 MG tablet Take 1 tablet (500 mg total) by mouth 2 (two) times daily for 7 days. 14 tablet Brasen Bundren, PA-C   fluconazole (DIFLUCAN) 150 MG tablet Take 1 tablet (150 mg total) by mouth once as needed for up to 2 doses (take one pill on day 1, and the second pill 3 days later). 2 tablet Ena Demary, Lurena Joiner, PA-C      PDMP not reviewed this encounter.   Marlow Baars, [place] 01/24/23 1327

## 2023-01-25 LAB — CERVICOVAGINAL ANCILLARY ONLY
Bacterial Vaginitis (gardnerella): POSITIVE — AB
Candida Glabrata: NEGATIVE
Candida Vaginitis: NEGATIVE
Chlamydia: NEGATIVE
Comment: NEGATIVE
Comment: NEGATIVE
Comment: NEGATIVE
Comment: NEGATIVE
Comment: NEGATIVE
Comment: NORMAL
Neisseria Gonorrhea: NEGATIVE
Trichomonas: NEGATIVE

## 2023-03-22 ENCOUNTER — Ambulatory Visit
Admission: EM | Admit: 2023-03-22 | Discharge: 2023-03-22 | Disposition: A | Discharge status: Home / Self Care | Payer: Medicaid Other | Source: Home / Self Care

## 2023-03-22 DIAGNOSIS — B9689 Other specified bacterial agents as the cause of diseases classified elsewhere: Secondary | ICD-10-CM | POA: Diagnosis not present

## 2023-03-22 DIAGNOSIS — S29012A Strain of muscle and tendon of back wall of thorax, initial encounter: Secondary | ICD-10-CM | POA: Insufficient documentation

## 2023-03-22 DIAGNOSIS — S39012A Strain of muscle, fascia and tendon of lower back, initial encounter: Secondary | ICD-10-CM

## 2023-03-22 DIAGNOSIS — Z87891 Personal history of nicotine dependence: Secondary | ICD-10-CM | POA: Insufficient documentation

## 2023-03-22 DIAGNOSIS — N760 Acute vaginitis: Secondary | ICD-10-CM | POA: Insufficient documentation

## 2023-03-22 MED ORDER — CYCLOBENZAPRINE HCL 5 MG PO TABS: 5.0000 mg | ORAL_TABLET | Freq: Every evening | ORAL | 0 refills | Status: DC | PRN

## 2023-03-22 MED ORDER — NAPROXEN 500 MG PO TABS: 500.0000 mg | ORAL_TABLET | Freq: Two times a day (BID) | ORAL | 0 refills | Status: DC

## 2023-03-22 MED ORDER — FLUCONAZOLE 150 MG PO TABS: 150.0000 mg | ORAL_TABLET | ORAL | 0 refills | Status: DC

## 2023-03-22 MED ORDER — CLINDAMYCIN HCL 300 MG PO CAPS: 300.0000 mg | ORAL_CAPSULE | Freq: Two times a day (BID) | ORAL | 0 refills | Status: DC
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
## 2023-03-28 LAB — CERVICOVAGINAL ANCILLARY ONLY
Bacterial Vaginitis (gardnerella): POSITIVE — AB
Candida Glabrata: NEGATIVE
Candida Vaginitis: NEGATIVE
Chlamydia: NEGATIVE
Comment: NEGATIVE
Comment: NEGATIVE
Comment: NEGATIVE
Comment: NEGATIVE
Comment: NEGATIVE
Comment: NORMAL
Neisseria Gonorrhea: NEGATIVE
Trichomonas: NEGATIVE

## 2023-09-26 ENCOUNTER — Encounter: Payer: Self-pay | Admitting: Emergency Medicine

## 2023-09-26 ENCOUNTER — Ambulatory Visit
Admission: EM | Admit: 2023-09-26 | Discharge: 2023-09-26 | Disposition: A | Discharge status: Home / Self Care | Attending: Physician Assistant | Admitting: Physician Assistant

## 2023-09-26 DIAGNOSIS — J069 Acute upper respiratory infection, unspecified: Secondary | ICD-10-CM

## 2023-09-26 DIAGNOSIS — Z20828 Contact with and (suspected) exposure to other viral communicable diseases: Secondary | ICD-10-CM | POA: Diagnosis not present

## 2023-09-26 LAB — POC COVID19/FLU A&B COMBO
Covid Antigen, POC: NEGATIVE
Influenza A Antigen, POC: NEGATIVE
Influenza B Antigen, POC: NEGATIVE

## 2023-09-26 MED ORDER — OSELTAMIVIR PHOSPHATE 75 MG PO CAPS: 75.0000 mg | ORAL_CAPSULE | Freq: Two times a day (BID) | ORAL | 0 refills | Status: DC

## 2023-09-26 NOTE — ED Triage Notes (Signed)
 Pt reports cough, sore throat, body aches, and headache that started yesterday. Reports fever of 102 this morning, but has come down with tylenol. Pt's daughter was seen earlier this week and tested positive for flu A. Using tylenol cold/flu and elderberry cough syrup  at home.

## 2023-09-26 NOTE — ED Provider Notes (Signed)
 [facility]    CSN: [id number] Arrival date & time: 09/26/23  0810      History   Chief Complaint Chief Complaint  Patient presents with   Cough   Sore Throat   Generalized Body Aches   Headache    HPI Ruth [name] is a 43 y.o. female.   Patient here today for evaluation of sore throat, fever, body aches and headache that started yesterday.  She reports temp of 102 this morning.  She has taken Tylenol which did help with this.  She states her daughter was seen earlier this week and was positive for flu.  She denies any vomiting or diarrhea.  The history is provided by the patient.  Cough Associated symptoms: chills, fever, headaches and sore throat   Associated symptoms: no ear pain, no eye discharge, no shortness of breath and no wheezing   Sore Throat Associated symptoms include headaches. Pertinent negatives include no abdominal pain and no shortness of breath.  Headache Associated symptoms: congestion, cough, fever and sore throat   Associated symptoms: no abdominal pain, no diarrhea, no ear pain, no nausea and no vomiting     Past Medical History:  Diagnosis Date   Asthma    COVID-19 07/17/2020   GERD (gastroesophageal reflux disease)    Hypertension    HTN with pregnancy    Pneumonia    2013    Patient Active Problem List   Diagnosis Date Noted   Acute nonintractable headache 06/07/2022   Sciatica 03/27/2022   Sacroiliitis (HCC) 05/10/2021   S/P endometrial ablation 05/10/2021   Bacterial vaginosis 05/11/2020   Screening for hyperlipidemia 09/16/2019   Vaginal discharge 03/12/2019   Obesity (BMI 30-39.9) 06/25/2013   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   ASTHMA, INTERMITTENT 02/19/2007    Past Surgical History:  Procedure Laterality Date   CHOLECYSTECTOMY  10/31/2011   Procedure: LAPAROSCOPIC CHOLECYSTECTOMY;  Surgeon: Liz Malady, MD;  Location: [facility];  Service: General;  Laterality: N/A;   DILATION AND CURETTAGE OF UTERUS     ENDOMETRIAL  ABLATION N/A 09/21/2020   Procedure: ENDOMETRIAL ABLATION WITH NOVASURE;  Surgeon: Adam Phenix, MD;  Location: [facility];  Service: Gynecology;  Laterality: N/A;   KNEE SURGERY Left 08/2014   LAPAROSCOPIC TUBAL LIGATION Bilateral 07/24/2016   Procedure: LAPAROSCOPIC TUBAL LIGATION;  Surgeon: Adam Phenix, MD;  Location: [facility];  Service: Gynecology;  Laterality: Bilateral;   WISDOM TOOTH EXTRACTION      OB History     Gravida  6   Para  4   Term  4   Preterm  0   AB  2   Living  4      SAB  2   IAB  0   Ectopic  0   Multiple  0   Live Births  4        Obstetric Comments  Have 4 children, vaginal deliveries           Home Medications    Prior to Admission medications   Medication Sig Start Date End Date Taking? Authorizing Provider  oseltamivir (TAMIFLU) 75 MG capsule Take 1 capsule (75 mg total) by mouth every 12 (twelve) hours. 09/26/23  Yes Tomi Bamberger, PA-C  clindamycin (CLEOCIN) 300 MG capsule Take 1 capsule (300 mg total) by mouth 2 (two) times daily. Patient not taking: Reported on 09/26/2023 03/22/23   Wallis Bamberg, PA-C  cyclobenzaprine (FLEXERIL) 5 MG tablet Take 1 tablet (5 mg total)  by mouth at bedtime as needed for muscle spasms. Patient not taking: Reported on 09/26/2023 03/22/23   Wallis Bamberg, PA-C  fluconazole (DIFLUCAN) 150 MG tablet Take 1 tablet (150 mg total) by mouth every 3 (three) days. Patient not taking: Reported on 09/26/2023 03/22/23   Wallis Bamberg, PA-C  naproxen (NAPROSYN) 500 MG tablet Take 1 tablet (500 mg total) by mouth 2 (two) times daily with a meal. Patient not taking: Reported on 09/26/2023 03/22/23   Wallis Bamberg, PA-C  SUMAtriptan (IMITREX) 50 MG tablet Take 1 tablet (50 mg total) by mouth every 2 (two) hours as needed for migraine. May repeat in 2 hours if headache persists or recurs. Patient not taking: Reported on 09/26/2023 06/07/22   Levin Erp, MD    Family History Family History  Problem Relation  Age of Onset   Drug abuse Mother    Congestive Heart Failure Mother    Cancer Father     Social History Social History   Tobacco Use   Smoking status: Former    Current packs/day: 0.20    Types: Cigarettes, Cigars   Smokeless tobacco: Never  Vaping Use   Vaping status: Never Used  Substance Use Topics   Alcohol use: Not Currently   Drug use: Not Currently     Allergies   Hydrocodone-acetaminophen   Review of Systems Review of Systems  Constitutional:  Positive for chills and fever.  HENT:  Positive for congestion and sore throat. Negative for ear pain.   Eyes:  Negative for discharge and redness.  Respiratory:  Positive for cough. Negative for shortness of breath and wheezing.   Gastrointestinal:  Negative for abdominal pain, diarrhea, nausea and vomiting.  Neurological:  Positive for headaches.     Physical Exam Triage Vital Signs ED Triage Vitals  Encounter Vitals Group     BP 09/26/23 0909 137/89     Systolic BP Percentile --      Diastolic BP Percentile --      Pulse Rate 09/26/23 0909 74     Resp 09/26/23 0909 16     Temp 09/26/23 0909 98.5 F (36.9 C)     Temp Source 09/26/23 0909 Oral     SpO2 09/26/23 0909 99 %     Weight --      Height --      Head Circumference --      Peak Flow --      Pain Score 09/26/23 0910 8     Pain Loc --      Pain Education --      Exclude from Growth Chart --    No data found.  Updated Vital Signs BP 137/89 (BP Location: Right Arm)   Pulse 74   Temp 98.5 F (36.9 C) (Oral)   Resp 16   SpO2 99%   Visual Acuity Right Eye Distance:   Left Eye Distance:   Bilateral Distance:    Right Eye Near:   Left Eye Near:    Bilateral Near:     Physical Exam Vitals and nursing note reviewed.  Constitutional:      General: She is not in acute distress.    Appearance: Normal appearance. She is not ill-appearing.  HENT:     Head: Normocephalic and atraumatic.     Nose: Congestion present.     Mouth/Throat:      Mouth: Mucous membranes are moist.     Pharynx: No oropharyngeal exudate or posterior oropharyngeal erythema.  Eyes:  Conjunctiva/sclera: Conjunctivae normal.  Cardiovascular:     Rate and Rhythm: Normal rate and regular rhythm.     Heart sounds: Normal heart sounds. No murmur heard. Pulmonary:     Effort: Pulmonary effort is normal. No respiratory distress.     Breath sounds: Normal breath sounds. No wheezing, rhonchi or rales.  Skin:    General: Skin is warm and dry.  Neurological:     Mental Status: She is alert.  Psychiatric:        Mood and Affect: Mood normal.        Thought Content: Thought content normal.      UC Treatments / Results  Labs (all labs ordered are listed, but only abnormal results are displayed) Labs Reviewed  POC COVID19/FLU A&B COMBO - Normal    EKG   Radiology No results found.  Procedures Procedures (including critical care time)  Medications Ordered in UC Medications - No data to display  Initial Impression / Assessment and Plan / UC Course  I have reviewed the triage vital signs and the nursing notes.  Pertinent labs & imaging results that were available during my care of the patient were reviewed by me and considered in my medical decision making (see chart for details).    Flu and COVID screening negative.  Given known exposure and flulike symptoms we will treat with Tamiflu and advised symptomatic treatment otherwise.  Encouraged increased fluids and rest with follow-up if no gradual improvement with any worsening symptoms.  Final Clinical Impressions(s) / UC Diagnoses   Final diagnoses:  Acute upper respiratory infection  Exposure to the flu   Discharge Instructions   None    ED Prescriptions     Medication Sig Dispense Auth. Provider   oseltamivir (TAMIFLU) 75 MG capsule Take 1 capsule (75 mg total) by mouth every 12 (twelve) hours. 10 capsule Tomi Bamberger, PA-C      PDMP not reviewed this encounter.   Tomi Bamberger, PA-C 09/26/23 [phone]
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
## 2023-12-10 ENCOUNTER — Encounter: Payer: Self-pay | Admitting: Family Medicine

## 2023-12-10 ENCOUNTER — Ambulatory Visit (INDEPENDENT_AMBULATORY_CARE_PROVIDER_SITE_OTHER): Admitting: Family Medicine

## 2023-12-10 VITALS — BP 125/81 | HR 74 | Ht 66.0 in | Wt 215.5 lb

## 2023-12-10 DIAGNOSIS — F330 Major depressive disorder, recurrent, mild: Secondary | ICD-10-CM | POA: Diagnosis not present

## 2023-12-10 DIAGNOSIS — Z6834 Body mass index (BMI) 34.0-34.9, adult: Secondary | ICD-10-CM

## 2023-12-10 DIAGNOSIS — E66811 Obesity, class 1: Secondary | ICD-10-CM

## 2023-12-10 DIAGNOSIS — M67432 Ganglion, left wrist: Secondary | ICD-10-CM | POA: Diagnosis not present

## 2023-12-10 DIAGNOSIS — R609 Edema, unspecified: Secondary | ICD-10-CM

## 2023-12-10 LAB — POCT GLYCOSYLATED HEMOGLOBIN (HGB A1C): Hemoglobin A1C: 5.7 % — AB (ref 4.0–5.6)

## 2023-12-10 NOTE — Patient Instructions (Addendum)
 It was wonderful to see you today! Thank you for choosing [facility].   Please bring ALL of your medications with you to every visit.   Today we talked about:  You have a ganglion cyst. You can use topical Voltaren  gel over the area if you are having pain.  It may go away and come back but unless it becomes extremely painful and affects your ability to move your hands I would put off having it surgically removed. We are getting some blood work today to check liver, kidneys, cholesterol, thyroid and sugar levels.  I will follow-up with you regarding these results. I agree with getting good protein intake we will keep you full for longer periods of time.  Please consider trying plant-based protein you can always add a plant-based protein powder to smoothies to make you safer longer.  Additionally lean meat sources or legumes such as tofu and chickpea can be good options.  I will also place a referral to a nutritionist for you to discuss further that hopefully will be covered by insurance. I included some counseling resources below.  Please let me know if you would like to discuss medication management for your depression further and we can explore different options. For the swelling in your legs I do recommend compression socks which you can find at the pharmacy or grocery store.  I also recommend a low-salt diet and elevating your legs this will help with the swelling.  Please follow up in 1-2 months for mood   We are checking some labs today. If they are abnormal, I will call you. If they are normal, I will send you a MyChart message (if it is active) or a letter in the mail. If you do not hear about your labs in the next 2 weeks, please call the office.  Call the clinic at [phone] if your symptoms worsen or you have any concerns.  Please be sure to schedule follow up at the front desk before you leave today.   Jonne Netters, DO Family Medicine     Therapy and  Counseling Resources Most providers on this list will take Medicaid. Patients with commercial insurance or Medicare should contact their insurance company to get a list of in network providers.  [facility] (takes children) Location 1: [address] [place], [place] [address] Location 2: [address] [place], [place] [address] [phone]   [facility] (spanish speaking therapist available)(habla espanol)(take medicare and medicaid)  [address], [place], [place] [address], [place]  [email or web address] [phone]  [facility] [address] [place], [place] [address] [phone]  [facility]   [address] [place], [place] [address]      [phone]  [facility] (spanish available) [address]  [place], [place] [address] [phone]  [facility] (take [facility] and medicare) [address], [address]  [place], [place] [address]       [phone]     [facility] (virtual only) [phone]  [facility] [address], [place], [place] [phone]  [facility]:  [address] [place] [phone]  [facility]:  [address] [place] [phone]  [facility] (under & uninsured) [address]   [place] [phone]    [email or web address]    [facility] [address]  [place]    [phone]  [facility] [place] -[address]     Phone:  [phone]     [facility]-  [address]  [phone]   [facility] [address]      [place], [place] [phone]  [facility]: [address] [place], [place], [place]  [phone]   [facility] (Spanish therapist) [email or web address]  [address] [place]  [address]   [place]  [place] [address]    [phone]    [facility]   [address],  [place], [place]  [phone]   [facility]  [address] [place]  [phone]  [facility]  [address] [place], [place]        [phone]  Open Access/Walk In Clinic under & uninsured  [facility] [place]  [address] [place], [place] [phone] Crisis [phone]  [facility] [address],  (Spanish)   [address] , [place]: [phone]) 8:30 - 12; 1 - 2:30  [facility] [employer],  [address] [place], [place]    [phone]):8:30 - 12; 2 - 3PM  [facility] [employer],  [address]  [place]; [phone]):   Mon - Fri 8 AM - 5 PM  [facility] [address] [place]  MWF 12:30 to 3:00 or call to schedule an appointment  [phone]  Specific Provider options Psychology Today  [email or web address]  click on find a therapist  enter your zip code left side and select or tailor a therapist for your specific need.   [facility] Provider Directory [email or web address]  (Medicaid)   Follow all drop down to find a provider  Social Support program [facility] [place] [phone] or PhotoSolver.pl [address], [place], [place] Recovery support and educational   24- Hour Availability:   [facility]  [address] [place], [place] [phone] Crisis [phone]  [facility] [phone]  [facility]  [phone]   [facility]  [phone] (after hours)  Therapeutic Alternative/Mobile Crisis   [phone]  [place]  National Suicide Hotline  [phone] Derrel Flies)  Call 911 or go to emergency room  [facility]  [phone]);  [place] and [employer]  [phone]);  [place], [place], [place], [place], [place], [place], [place]

## 2023-12-11 LAB — COMPREHENSIVE METABOLIC PANEL WITH GFR
ALT: 21 IU/L (ref 0–32)
AST: 22 IU/L (ref 0–40)
Albumin: 3.9 g/dL (ref 3.9–4.9)
Alkaline Phosphatase: 62 IU/L (ref 44–121)
BUN/Creatinine Ratio: 9 (ref 9–23)
BUN: 7 mg/dL (ref 6–24)
Bilirubin Total: 0.3 mg/dL (ref 0.0–1.2)
CO2: 24 mmol/L (ref 20–29)
Calcium: 9.3 mg/dL (ref 8.7–10.2)
Chloride: 105 mmol/L (ref 96–106)
Creatinine, Ser: 0.82 mg/dL (ref 0.57–1.00)
Globulin, Total: 2 g/dL (ref 1.5–4.5)
Glucose: 91 mg/dL (ref 70–99)
Potassium: 4.9 mmol/L (ref 3.5–5.2)
Sodium: 141 mmol/L (ref 134–144)
Total Protein: 5.9 g/dL — ABNORMAL LOW (ref 6.0–8.5)
eGFR: 91 mL/min/{1.73_m2} (ref >59)

## 2023-12-11 LAB — LIPID PANEL
Chol/HDL Ratio: 3.3 ratio (ref 0.0–4.4)
Cholesterol, Total: 214 mg/dL — ABNORMAL HIGH (ref 100–199)
HDL: 64 mg/dL (ref >39)
LDL Chol Calc (NIH): 134 mg/dL — ABNORMAL HIGH (ref 0–99)
Triglycerides: 92 mg/dL (ref 0–149)
VLDL Cholesterol Cal: 16 mg/dL (ref 5–40)

## 2023-12-11 LAB — TSH: TSH: 1.28 u[IU]/mL (ref 0.450–4.500)

## 2023-12-12 ENCOUNTER — Ambulatory Visit: Payer: Self-pay | Admitting: Family Medicine

## 2023-12-12 LAB — MICROALBUMIN / CREATININE URINE RATIO
Creatinine, Urine: 171.2 mg/dL
Microalb/Creat Ratio: 2 mg/g{creat} (ref 0–29)
Microalbumin, Urine: 3.4 ug/mL

## 2023-12-27 ENCOUNTER — Ambulatory Visit: Admitting: Obstetrics and Gynecology

## 2023-12-27 ENCOUNTER — Ambulatory Visit (HOSPITAL_COMMUNITY)
Admission: RE | Admit: 2023-12-27 | Discharge: 2023-12-27 | Disposition: A | Discharge status: Home / Self Care | Source: Ambulatory Visit | Attending: Obstetrics and Gynecology | Admitting: Obstetrics and Gynecology

## 2023-12-27 ENCOUNTER — Encounter: Payer: Self-pay | Admitting: Obstetrics and Gynecology

## 2023-12-27 VITALS — BP 118/79 | HR 66 | Ht 66.0 in | Wt 214.0 lb

## 2023-12-27 DIAGNOSIS — K5902 Outlet dysfunction constipation: Secondary | ICD-10-CM | POA: Diagnosis not present

## 2023-12-27 DIAGNOSIS — M791 Myalgia, unspecified site: Secondary | ICD-10-CM

## 2023-12-27 DIAGNOSIS — Z9851 Tubal ligation status: Secondary | ICD-10-CM | POA: Diagnosis not present

## 2023-12-27 DIAGNOSIS — N946 Dysmenorrhea, unspecified: Secondary | ICD-10-CM | POA: Diagnosis not present
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
## 2023-12-27 NOTE — Progress Notes (Addendum)
 43 y.o. GYN presents for severe abdominal pain 4-10+/10, irregular periods, sometime 2x month, skips a day and starts again  Pt has a CD of her XRay showing the clips from her BTL been loose.  Last PAP 12/12/21 NILM.  Pt has never had a Mammogram and declined to have one.

## 2023-12-27 NOTE — Patient Instructions (Signed)
 Pelvic floor physical therapy for the pelvic pain and difficulty with bowel movement  Use of miralax or high fiber containing foods for bowel movements  Pelvic ultrasound to get a look at your uterus  Pelvic xray to see where your filshie clips are

## 2024-01-02 ENCOUNTER — Ambulatory Visit: Payer: Self-pay | Admitting: Obstetrics and Gynecology

## 2024-01-07 ENCOUNTER — Ambulatory Visit (HOSPITAL_COMMUNITY)
Admission: RE | Admit: 2024-01-07 | Discharge: 2024-01-07 | Disposition: A | Discharge status: Home / Self Care | Source: Ambulatory Visit | Attending: Obstetrics and Gynecology | Admitting: Obstetrics and Gynecology

## 2024-01-07 DIAGNOSIS — N946 Dysmenorrhea, unspecified: Secondary | ICD-10-CM | POA: Insufficient documentation

## 2024-01-07 DIAGNOSIS — D259 Leiomyoma of uterus, unspecified: Secondary | ICD-10-CM | POA: Diagnosis not present

## 2024-01-07 DIAGNOSIS — N83201 Unspecified ovarian cyst, right side: Secondary | ICD-10-CM | POA: Diagnosis not present

## 2024-01-07 DIAGNOSIS — N83202 Unspecified ovarian cyst, left side: Secondary | ICD-10-CM | POA: Diagnosis not present

## 2024-01-07 DIAGNOSIS — R102 Pelvic and perineal pain: Secondary | ICD-10-CM | POA: Diagnosis not present

## 2024-01-09 ENCOUNTER — Ambulatory Visit: Admitting: Family Medicine

## 2024-01-09 VITALS — BP 115/78 | HR 92 | Ht 66.0 in | Wt 215.2 lb

## 2024-01-09 DIAGNOSIS — G589 Mononeuropathy, unspecified: Secondary | ICD-10-CM | POA: Insufficient documentation

## 2024-01-09 MED ORDER — TIZANIDINE HCL 2 MG PO TABS: 2.0000 mg | ORAL_TABLET | Freq: Three times a day (TID) | ORAL | 0 refills | Status: DC | PRN

## 2024-01-09 NOTE — Assessment & Plan Note (Signed)
 Likely in the setting of muscle strain.  Could also have some underlying cervical stenosis.  Less likely rheumatologic or malignant cause, though patient does have a history of sacroiliitis.  Reassured by otherwise normal neurologic exam today.  Will start with physical therapy and short course of tizanidine.  She is currently following a physical therapy for pelvic floor exercises; she will ask if they can also work with her for cervical/shoulder PT and send me an order for signing.  Also discussed sedative effects of tizanidine.  If pain is not improving over the next 6 weeks, consider radiograph of the cervical spine and potential MRI.

## 2024-01-09 NOTE — Patient Instructions (Signed)
 I have sent in tizanidine for your muscle spasm and likely pinched nerve. If you would like to try gabapentin In the future let me know. Also be sure to chat with your physical therapist about sending me an updated order for cervical spine and shoulder therapy. If you are not improving after these, we may need to consider imaging of the neck.

## 2024-01-09 NOTE — Progress Notes (Signed)
    SUBJECTIVE:   CHIEF COMPLAINT / HPI:   Arm tingling Went to [facility] about 3 weeks ago. Before she went, she woke up with a crick in her neck. She had some pain in the right shoulder blade after that. She tried to work it out, but it didn't help much. On the plane to [place], she fell asleep on her right shoulder, and she feels tingling and numbness down into her right hand and fingers. When she moves her neck toward the shoulder, she feels a pulling sensation. No weakness. Never had this before.  PERTINENT  PMH / PSH: sciatica, asthma, sacroiliitis  OBJECTIVE:   BP 115/78   Pulse 92   Ht 5' 6 (1.676 m)   Wt 215 lb 3.2 oz (97.6 kg)   LMP 12/21/2023 (Exact Date)   SpO2 98%   BMI 34.73 kg/m   General: Alert and oriented, in NAD Skin: Warm, dry, and intact without lesions HEENT: NCAT, EOM grossly normal, midline nasal septum Respiratory: Breathing and speaking comfortably on RA Extremities: Moves all extremities grossly equally Neurological: No gross focal deficit, strength and sensation intact in bilateral upper extremities, biceps reflex intact on the R Psychiatric: Appropriate mood and affect   ASSESSMENT/PLAN:   Assessment & Plan Pinched nerve Likely in the setting of muscle strain.  Could also have some underlying cervical stenosis.  Less likely rheumatologic or malignant cause, though patient does have a history of sacroiliitis.  Reassured by otherwise normal neurologic exam today.  Will start with physical therapy and short course of tizanidine.  She is currently following a physical therapy for pelvic floor exercises; she will ask if they can also work with her for cervical/shoulder PT and send me an order for signing.  Also discussed sedative effects of tizanidine.  If pain is not improving over the next 6 weeks, consider radiograph of the cervical spine and potential MRI.   Genetta Kenning, MD [facility]

## 2024-01-23 ENCOUNTER — Encounter: Payer: Self-pay | Admitting: Dietician

## 2024-01-23 ENCOUNTER — Encounter: Attending: Family Medicine | Admitting: Dietician

## 2024-01-23 VITALS — Wt 215.9 lb

## 2024-01-23 DIAGNOSIS — E669 Obesity, unspecified: Secondary | ICD-10-CM | POA: Diagnosis not present
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
## 2024-01-23 NOTE — Patient Instructions (Signed)
 Goals Established by Pt  Goal 1: aim to drink 4 bottles of water daily (totals 64 oz).  Goal 2: eat 2 balanced meals per day. (Aim to follow Plate Method handout).  Examples:  Peanut butter and banana on whole wheat toast. Tuna salad, whole grain crackers, veggies and dip. Beans and rice and veggies.

## 2024-01-28 ENCOUNTER — Ambulatory Visit: Admitting: Obstetrics and Gynecology

## 2024-01-28 ENCOUNTER — Encounter: Payer: Self-pay | Admitting: Obstetrics and Gynecology

## 2024-01-28 VITALS — BP 135/82 | HR 88 | Ht 66.0 in | Wt 215.0 lb

## 2024-01-28 DIAGNOSIS — K5902 Outlet dysfunction constipation: Secondary | ICD-10-CM

## 2024-01-28 DIAGNOSIS — N946 Dysmenorrhea, unspecified: Secondary | ICD-10-CM | POA: Diagnosis not present

## 2024-01-28 NOTE — Progress Notes (Signed)
 Menses irregular. Has cramping with all periods. No other concerns.

## 2024-01-28 NOTE — Progress Notes (Signed)
 GYNECOLOGY VISIT  [name] MRN [id number]  Date of birth: [birth year] Chief Complaint:   Follow-up  History:  Ruth [name] is a 43 y.o. [id number] being seen today for review of US  and pelvic xray results.    Discussed the use of AI scribe software for clinical note transcription with the patient, who gave verbal consent to proceed.  History of Present Illness Ruth [name] is a 43 year old female who presents with pelvic pain and bowel movement difficulties following a uterine ablation.  She experiences significant pelvic pain, particularly during her menstrual periods. The pain is described as severe, preventing her from moving and working, with a sensation of 'my butt tops feel like it's falling out when my menstrual is here.' This pain has been ongoing for years, and although a previous uterine ablation initially provided relief, the pain has since returned.  She also has difficulty with bowel movements, especially during her menstrual cycle. She describes a sensation of pressure and an inability to contract her muscles to have a bowel movement, accompanied by nerve-related tingling sensations.  Her ultrasound and x-ray results showed a normal-sized uterus with a thin lining post-ablation and clips in expected positions. There is a very small fibroid located at the top of the uterus, described as 'not even as big as my finger.'  She has tried various pain management strategies including Tylenol  and pelvic floor exercises, but these have not provided sufficient relief. She has a history of allergies to hydrocodone and acetaminophen , specifically Vicodin, but can tolerate regular Tylenol  and ibuprofen .  Her past medical history includes a uterine ablation, gallbladder surgery, and two knee surgeries. No complications with anesthesia during these procedures. No history of blood clots in the lungs or legs.  Social history reveals she quit smoking cigarettes around 2019 or  2020. She has four children and describes her pain as severe, impacting her ability to care for her family and work.    Past Medical History:  Diagnosis Date   Asthma    COVID-19 07/17/2020   GERD (gastroesophageal reflux disease)    Hypertension    HTN with pregnancy    Pneumonia    2013    Past Surgical History:  Procedure Laterality Date   CHOLECYSTECTOMY  10/31/2011   Procedure: LAPAROSCOPIC CHOLECYSTECTOMY;  Surgeon: Dann FORBES Hummer, MD;  Location: [facility];  Service: General;  Laterality: N/A;   DILATION AND CURETTAGE OF UTERUS     ENDOMETRIAL ABLATION N/A 09/21/2020   Procedure: ENDOMETRIAL ABLATION WITH NOVASURE;  Surgeon: Eveline Lynwood MATSU, MD;  Location: [facility];  Service: Gynecology;  Laterality: N/A;   KNEE SURGERY Left 08/2014   LAPAROSCOPIC TUBAL LIGATION Bilateral 07/24/2016   Procedure: LAPAROSCOPIC TUBAL LIGATION;  Surgeon: Lynwood MATSU Eveline, MD;  Location: [facility];  Service: Gynecology;  Laterality: Bilateral;   WISDOM TOOTH EXTRACTION      The following portions of the patient's history were reviewed and updated as appropriate: allergies, current medications, past family history, past medical history, past social history, past surgical history and problem list.   Health Maintenance:   Last pap     Component Value Date/Time   DIAGPAP  12/12/2021 1344    - Negative for intraepithelial lesion or malignancy (NILM)   DIAGPAP  05/30/2018 0000    NEGATIVE FOR INTRAEPITHELIAL LESIONS OR MALIGNANCY.   HPVHIGH Negative 12/12/2021 1344   ADEQPAP  12/12/2021 1344    Satisfactory for evaluation; transformation zone component PRESENT.   ADEQPAP  05/30/2018 0000    Satisfactory for evaluation  endocervical/transformation zone component ABSENT.    Last mammogram: none seen on file   Review of Systems:  Pertinent items are noted in HPI. Comprehensive review of systems was otherwise negative.   Objective:  Physical Exam BP 135/82   Pulse 88   Ht 5' 6  (1.676 m)   Wt 215 lb (97.5 kg)   LMP 01/11/2024 (Exact Date)   BMI 34.70 kg/m    Physical Exam Vitals and nursing note reviewed.  Constitutional:      Appearance: Normal appearance.  HENT:     Head: Normocephalic and atraumatic.  Pulmonary:     Effort: Pulmonary effort is normal.  Skin:    General: Skin is warm and dry.  Neurological:     General: No focal deficit present.     Mental Status: She is alert.  Psychiatric:        Mood and Affect: Mood normal.        Behavior: Behavior normal.        Thought Content: Thought content normal.        Judgment: Judgment normal.      Labs and Imaging US  PELVIC COMPLETE WITH TRANSVAGINAL Result Date: 01/10/2024 CLINICAL DATA:  Dysmenorrhea. History of endometrial ablation and tubal ligation. EXAM: TRANSABDOMINAL AND TRANSVAGINAL ULTRASOUND OF PELVIS TECHNIQUE: Both transabdominal and transvaginal ultrasound examinations of the pelvis were performed. Transabdominal technique was performed for global imaging of the pelvis including uterus, ovaries, adnexal regions, and pelvic cul-de-sac. It was necessary to proceed with endovaginal exam following the transabdominal exam to visualize the uterus and ovaries/adnexa to better advantage. COMPARISON:  None Available. FINDINGS: Uterus Measurements: 02/29/2020. 8.4 x 5.1 x 6.0 cm = volume: 135.4 mL. Small hypoechoic lesion in the anterior upper uterine segment measuring 7 mm, too small to characterize but suspected to be a fibroid. No other uterine masses or abnormalities. Endometrium Thickness: 3 mm.  No focal abnormality visualized. Right ovary Measurements: 3.8 x 2.1 x 2.3 cm = volume: 9.4 mL. Complex ovarian cyst measures 1.9 x 1.6 x 1.5 cm. It has internal echoes suspected to be a hemorrhagic cyst. No adnexal masses. Left ovary Measurements: 4.4 x 3.1 x 4.4 cm = volume: 31.7 mL. Complex cyst measuring 3.9 x 2.5 x 3.7 cm consistent with a hemorrhagic cyst. Ovary otherwise unremarkable. No adnexal  masses. Other findings Small amount of pelvic free fluid. Some left adnexal pain during exam. IMPRESSION: 1. Bilateral complex ovarian cysts, left larger measuring 3.9 cm, left measuring 1.9 cm, both appear to be hemorrhagic cysts. Short-interval follow up ultrasound in 6-12 weeks is recommended, preferably during the week following the patient's normal menses. 2. Probable subcentimeter uterine fibroid. 3. No other abnormalities. Electronically Signed   By: Alm Parkins M.D.   On: 01/10/2024 15:46       Assessment & Plan:   Assessment & Plan Post-ablation spotting and pain Spotting and bleeding expected post-ablation. Pain during bowel movements and menstruation affects daily activities. Ultrasound normal, small fibroid unlikely to affect bowel movements. - Refer to pelvic floor physical therapy. - Refer to gastroenterologist for bowel movement evaluation. - Discussed hysterectomy for menstrual pain relief.  Hysterectomy consideration Severe menstrual pain affects quality of life. Hysterectomy can alleviate symptoms but not GI issues. Risks include bleeding, infection, and pelvic organ injury. Procedure planned as same-day laparoscopic with potential for open surgery. Informed consent obtained, understanding procedure is voluntary and irreversible. - Schedule hysterectomy as same-day laparoscopic procedure. - Discuss  risks including bleeding, infection, and pelvic organ injury. - Ensure understanding of voluntary and irreversible nature. - Discussed post-operative care restrictions. - Confirmed consent for blood transfusion if needed. - Confirmed preference for resuscitation measures.  Medication allergies Allergic to hydrocodone and acetaminophen , no issues with plain Tylenol . - Avoid hydrocodone and acetaminophen  combination - Use plain Tylenol  for pain management.  Smoking cessation Quit smoking 6-7 years ago, no current tobacco use.  Follow-up Prefers hysterectomy in [place],  open to scheduling further out. - Schedule follow-up post-hysterectomy at 2-4 weeks and 8 weeks. - Ensure hysterectomy consent form signed. - Provide gastroenterologist referral. - Encourage return to pelvic floor physical therapy post-clearance.  Routine preventative health maintenance measures emphasized.  Carter Quarry, MD [facility]

## 2024-02-04 ENCOUNTER — Ambulatory Visit
Admission: RE | Admit: 2024-02-04 | Discharge: 2024-02-04 | Disposition: A | Discharge status: Home / Self Care | Source: Ambulatory Visit | Attending: Family Medicine | Admitting: Family Medicine

## 2024-02-04 ENCOUNTER — Ambulatory Visit: Admitting: Family Medicine

## 2024-02-04 ENCOUNTER — Encounter: Payer: Self-pay | Admitting: Family Medicine

## 2024-02-04 ENCOUNTER — Ambulatory Visit (INDEPENDENT_AMBULATORY_CARE_PROVIDER_SITE_OTHER): Admitting: Family Medicine

## 2024-02-04 VITALS — BP 124/79 | HR 88 | Ht 66.0 in | Wt 212.4 lb

## 2024-02-04 DIAGNOSIS — M25511 Pain in right shoulder: Secondary | ICD-10-CM

## 2024-02-04 DIAGNOSIS — M50322 Other cervical disc degeneration at C5-C6 level: Secondary | ICD-10-CM | POA: Diagnosis not present

## 2024-02-04 NOTE — Patient Instructions (Addendum)
 It was wonderful to see you today.  Please bring ALL of your medications with you to every visit.   Today we talked about:  Your right shoulder. I am getting an xray to see if there is anything going on with your spine that may be causing impingement on your nerve. If these are negative, we may consider getting an MRI. I am also referring you to physical therapy for the shoulder pain.   You can walk into the imaging center anytime between 8-5 on M-F.   [address] [place], [place] [address]  Phone [phone] Fax [phone] Get Directions Hours of Operation 6:30 am - 7 pm, Monday-Sunday (MRI Only)  8:00 am - 5 pm (Ultrasound, X-Ray)  Thank you for choosing [facility].   Please call [phone] with any questions about today's appointment.  Please arrive at least 15 minutes prior to your scheduled appointments.   If you had blood work today, I will send you a MyChart message or a letter if results are normal. Otherwise, I will give you a call.   If you had a referral placed, they will call you to set up an appointment. Please give us  a call if you don't hear back in the next 2 weeks.   If you need additional refills before your next appointment, please call your pharmacy first.   You should follow up in our clinic in [place] in about 4 weeks (around 03/03/2024).  Gloriann Ogren, MD Family Medicine

## 2024-02-04 NOTE — Progress Notes (Signed)
    SUBJECTIVE:   CHIEF COMPLAINT / HPI:  Patient presents for right shoulder numbness.  Patient presented on 6/19 for arm tingling.  She was diagnosed with pinched nerve likely in the setting of a muscle strain.  Patient was recommended to start physical therapy and short course of tizanidine . She has not tried physical therapy because she was told she needed a referral. Denies any associated chest pain or SOB. Denies any decrease in strength or range of motion. Denies any pain in her neck or spine.   Anytime she has bowel movement, she has tingling in her right arm. When she leans forward she starts feeling tingling in her right shoulder and shoots down into her fingers. Cannot lay on her right side due to pain on that side. Pain is pins/needles and numbness that starts at her shoulder and moves into in her fingertips. Tried tizanidine  for a week and that didn't really help. Tylenol /advil  did not help. Sometimes will change posture and that willl help. Really notices it when she leans forward.   Nothing like this happened to her before. Noticed the pain after her trip to [place] about 2 months ago where she thinks she had a crook in her neck. The tingling and numbness in her arm did not start until she returned from that trip.   PERTINENT  PMH / PSH:    OBJECTIVE:   BP 124/79   Pulse 88   Ht 5' 6 (1.676 m)   Wt 212 lb 6.4 oz (96.3 kg)   LMP 01/11/2024 (Exact Date)   SpO2 100%   BMI 34.28 kg/m   General: A&O, NAD HEENT: No sign of trauma, EOM grossly intact Cardiac: RRR Respiratory: NWOB  MSK: Bilateral shoulder blades even with appropriate posture.  No guarding or immobilization noted.  No abnormalities in motion noted.  No obvious deformities on exam.  No atrophy of musculature noted.  No tenderness to palpation along cervical spine.  No step-offs or obvious deformities noted along cervical spine.  Patient has mild right shoulder pain on palpation of anterior glenohumeral joint.  No  tenderness to palpation along cervical spine, clavicle, scapular spine, or bicipital groove.  Full range of motion throughout bilateral shoulders.  Patient with numbness and tingling in right arm down to fingers on internal rotation of 90 degree abducted shoulder on right side.  Complete range of motion and strength left arm and shoulder   ASSESSMENT/PLAN:   Assessment & Plan Acute pain of right shoulder Initial concern for nerve impingement secondary to inflammation of musculature.  Given patient has had no improvement in symptoms for over 2 months, it is reasonable to investigate further.  Will obtain x-rays of right shoulder and cervical spine.  PT referral sent for patient.  Discussed management of inflammation with ibuprofen  and topical Voltaren  gel, patient reports she has tried this previously and has not worked.  Will wait for x-rays prior to considering further workup with MRI.     Gloriann Ogren, MD [facility]

## 2024-02-07 ENCOUNTER — Encounter: Payer: Self-pay | Admitting: Physician Assistant

## 2024-02-11 ENCOUNTER — Ambulatory Visit: Payer: Self-pay | Admitting: Family Medicine

## 2024-02-11 DIAGNOSIS — M5030 Other cervical disc degeneration, unspecified cervical region: Secondary | ICD-10-CM

## 2024-02-11 NOTE — Addendum Note (Signed)
 Addended byBETHA LONNIE EARNEST on: 02/11/2024 01:34 PM   Modules accepted: Orders

## 2024-02-11 NOTE — Telephone Encounter (Signed)
 Called patient to discuss results of cervical spine xray. Consistent with cervical degenerative disc disease. Patient was trialed NSAIDs and muscle relaxers without relief. Ordered PT referral at last visit. Will refer to orthopaedic surgery for further management given conservative management

## 2024-03-17 DIAGNOSIS — F411 Generalized anxiety disorder: Secondary | ICD-10-CM | POA: Diagnosis not present

## 2024-03-21 DIAGNOSIS — F411 Generalized anxiety disorder: Secondary | ICD-10-CM | POA: Diagnosis not present

## 2024-03-24 ENCOUNTER — Telehealth: Payer: Self-pay

## 2024-03-24 NOTE — Telephone Encounter (Signed)
 I called the patient to see if she's available for surgery w/ Dr. Jeralyn on 05/19/24 at [facility] at 7:30 am. I left a voicemail requesting patient call me back to schedule surgery.

## 2024-03-26 DIAGNOSIS — F411 Generalized anxiety disorder: Secondary | ICD-10-CM | POA: Diagnosis not present

## 2024-03-28 DIAGNOSIS — F411 Generalized anxiety disorder: Secondary | ICD-10-CM | POA: Diagnosis not present

## 2024-03-30 DIAGNOSIS — F411 Generalized anxiety disorder: Secondary | ICD-10-CM | POA: Diagnosis not present

## 2024-03-31 ENCOUNTER — Ambulatory Visit: Admitting: Dietician

## 2024-04-02 ENCOUNTER — Other Ambulatory Visit (INDEPENDENT_AMBULATORY_CARE_PROVIDER_SITE_OTHER)

## 2024-04-02 ENCOUNTER — Ambulatory Visit: Admitting: Physician Assistant

## 2024-04-02 ENCOUNTER — Encounter: Payer: Self-pay | Admitting: Physician Assistant

## 2024-04-02 VITALS — BP 110/70 | HR 78 | Ht 66.0 in | Wt 209.0 lb

## 2024-04-02 DIAGNOSIS — K5900 Constipation, unspecified: Secondary | ICD-10-CM

## 2024-04-02 DIAGNOSIS — K6289 Other specified diseases of anus and rectum: Secondary | ICD-10-CM

## 2024-04-02 DIAGNOSIS — R1032 Left lower quadrant pain: Secondary | ICD-10-CM

## 2024-04-02 DIAGNOSIS — R1031 Right lower quadrant pain: Secondary | ICD-10-CM

## 2024-04-02 DIAGNOSIS — Z9049 Acquired absence of other specified parts of digestive tract: Secondary | ICD-10-CM

## 2024-04-02 LAB — CBC WITH DIFFERENTIAL/PLATELET
Basophils Absolute: 0 K/uL (ref 0.0–0.1)
Basophils Relative: 1.1 % (ref 0.0–3.0)
Eosinophils Absolute: 0.1 K/uL (ref 0.0–0.7)
Eosinophils Relative: 1.7 % (ref 0.0–5.0)
HCT: 41.6 % (ref 36.0–46.0)
Hemoglobin: 13.7 g/dL (ref 12.0–15.0)
Lymphocytes Relative: 29.4 % (ref 12.0–46.0)
Lymphs Abs: 1.3 K/uL (ref 0.7–4.0)
MCHC: 32.9 g/dL (ref 30.0–36.0)
MCV: 85.9 fl (ref 78.0–100.0)
Monocytes Absolute: 0.4 K/uL (ref 0.1–1.0)
Monocytes Relative: 8.5 % (ref 3.0–12.0)
Neutro Abs: 2.7 K/uL (ref 1.4–7.7)
Neutrophils Relative %: 59.3 % (ref 43.0–77.0)
Platelets: 315 K/uL (ref 150.0–400.0)
RBC: 4.84 Mil/uL (ref 3.87–5.11)
RDW: 13.3 % (ref 11.5–15.5)
WBC: 4.5 K/uL (ref 4.0–10.5)

## 2024-04-02 LAB — COMPREHENSIVE METABOLIC PANEL WITH GFR
ALT: 15 U/L (ref 0–35)
AST: 16 U/L (ref 0–37)
Albumin: 3.8 g/dL (ref 3.5–5.2)
Alkaline Phosphatase: 54 U/L (ref 39–117)
BUN: 7 mg/dL (ref 6–23)
CO2: 31 meq/L (ref 19–32)
Calcium: 9 mg/dL (ref 8.4–10.5)
Chloride: 103 meq/L (ref 96–112)
Creatinine, Ser: 0.83 mg/dL (ref 0.40–1.20)
GFR: 86.17 mL/min (ref >60.00)
Glucose, Bld: 111 mg/dL — ABNORMAL HIGH (ref 70–99)
Potassium: 4 meq/L (ref 3.5–5.1)
Sodium: 139 meq/L (ref 135–145)
Total Bilirubin: 0.4 mg/dL (ref 0.2–1.2)
Total Protein: 6.4 g/dL (ref 6.0–8.3)

## 2024-04-02 NOTE — Patient Instructions (Addendum)
 Your provider has requested that you go to the basement level for lab work before leaving today. Press B on the elevator. The lab is located at the first door on the left as you exit the elevator.  We have given you samples of the following medication to take: Linzess 72 mcg and Linzess 145 mcg. Please give us  a call or send a MyChart message and let us  know which dose works best for you.  You have been scheduled for a CT scan of the abdomen and pelvis at [facility], 1st floor Radiology. You are scheduled on 04/09/24 at 8:30 am. You should arrive 15 minutes prior to your appointment time for registration.    If you have any questions regarding your exam or if you need to reschedule, you may call [facility] at [phone] between the hours of 8:00 am and 5:00 pm, Monday-Friday.   Please follow up sooner if symptoms increase or worsen  Due to recent changes in healthcare laws, you may see the results of your imaging and laboratory studies on MyChart before your provider has had a chance to review them.  We understand that in some cases there may be results that are confusing or concerning to you. Not all laboratory results come back in the same time frame and the provider may be waiting for multiple results in order to interpret others.  Please give us  48 hours in order for your provider to thoroughly review all the results before contacting the office for clarification of your results.   Thank you for trusting me with your gastrointestinal care!   Ellouise Console, PA-C _______________________________________________________  If your blood pressure at your visit was 140/90 or greater, please contact your primary care physician to follow up on this.  _______________________________________________________  If you are age 32 or older, your body mass index should be between 23-30. Your Body mass index is 33.73 kg/m. If this is out of the aforementioned range listed, please  consider follow up with your Primary Care Provider.  If you are age 38 or younger, your body mass index should be between 19-25. Your Body mass index is 33.73 kg/m. If this is out of the aformentioned range listed, please consider follow up with your Primary Care Provider.   ________________________________________________________  The [facility] providers would like to encourage you to use MYCHART to communicate with providers for non-urgent requests or questions.  Due to long hold times on the telephone, sending your provider a message by [facility] may be a faster and more efficient way to get a response.  Please allow 48 business hours for a response.  Please remember that this is for non-urgent requests.  _______________________________________________________

## 2024-04-02 NOTE — Progress Notes (Signed)
 Ellouise Console, PA-C [address] [place], [place]  [address] Phone: [phone]   Gastroenterology Consultation  Referring Provider:     Theophilus Pagan, MD Primary Care Physician:  Theophilus Pagan, MD Primary Gastroenterologist:  Ellouise Console, PA-C / Dr. Gordy Starch  Reason for Consultation:     Constipation, lower abdominal pain        HPI:   Ruth [name] is a 43 y.o. y/o female referred for consultation & management  by Theophilus Pagan, MD.    Current symptoms: She underwent uterine ablation 09/2020.  Cholecystectomy 10/2011.  BTL 2018.  She has recently seen her GYN for chronic pelvic pain.  Has 4 children.  She has been constipated.  Her GYN recommended GI evaluation.  She has tried Tylenol  and pelvic floor exercises with little benefit.  Constipation is worse during menstrual cycle.  Increased pelvic pain during menses.  Pain has been located in the LLQ and RLQ lower abdomen.  Ongoing for years.  She typically has 2 bowel movements daily.  During her menstrual cycle she can go many days with no bowel movement.  She denies rectal bleeding or weight loss.  She has had some associated rectal pain when she is constipated.  Denies family history of colon cancer.  12/2023 pelvic ultrasound: Unrevealing.  No acute abnormality.  12/2023 pelvic x-ray: No acute abnormality.  No recent abdominal pelvic CT.  11/2023 labs: Normal TSH and CMP.  No recent CBC.  She Previously saw Dr. Debrah here in 2008 for GERD.  Underwent EGD which showed esophagitis.  Has not had any GI follow-up since then.  No previous colonoscopy.  Past Medical History:  Diagnosis Date   Asthma    COVID-19 07/17/2020   GERD (gastroesophageal reflux disease)    Hypertension    HTN with pregnancy    Pneumonia    2013    Past Surgical History:  Procedure Laterality Date   CHOLECYSTECTOMY  10/31/2011   Procedure: LAPAROSCOPIC CHOLECYSTECTOMY;  Surgeon: Dann FORBES Hummer, MD;  Location: [facility];  Service:  General;  Laterality: N/A;   DILATION AND CURETTAGE OF UTERUS     ENDOMETRIAL ABLATION N/A 09/21/2020   Procedure: ENDOMETRIAL ABLATION WITH NOVASURE;  Surgeon: Eveline Lynwood MATSU, MD;  Location: [facility];  Service: Gynecology;  Laterality: N/A;   KNEE SURGERY Left 08/2014   LAPAROSCOPIC TUBAL LIGATION Bilateral 07/24/2016   Procedure: LAPAROSCOPIC TUBAL LIGATION;  Surgeon: Lynwood MATSU Eveline, MD;  Location: [facility];  Service: Gynecology;  Laterality: Bilateral;   WISDOM TOOTH EXTRACTION      Prior to Admission medications   Not on File    Family History  Problem Relation Age of Onset   COPD Mother    Skin cancer Father    Congestive Heart Failure Maternal Grandmother      Social History   Tobacco Use   Smoking status: Former    Current packs/day: 0.20    Types: Cigarettes, Cigars   Smokeless tobacco: Never  Vaping Use   Vaping status: Never Used  Substance Use Topics   Alcohol use: Not Currently   Drug use: Not Currently    Allergies as of 04/02/2024 - Review Complete 04/02/2024  Allergen Reaction Noted   Hydrocodone-acetaminophen  Nausea And Vomiting and Swelling 10/01/2019    Review of Systems:    All systems reviewed and negative except where noted in HPI.   Physical Exam:  BP 110/70   Pulse 78   Ht 5' 6 (  1.676 m)   Wt 209 lb (94.8 kg)   BMI 33.73 kg/m  No LMP recorded. (Menstrual status: Bilateral Tubal Ligation).  General:   Alert,  Well-developed, well-nourished, pleasant and cooperative in NAD Lungs:  Respirations even and unlabored.  Clear throughout to auscultation.   No wheezes, crackles, or rhonchi. No acute distress. Heart:  Regular rate and rhythm; no murmurs, clicks, rubs, or gallops. Abdomen:  Normal bowel sounds.  No bruits.  Soft, and obese without masses, hepatosplenomegaly or hernias noted.  Moderate diffuse generalized upper and lower abdominal Tenderness bilaterally.  Moderate LLQ and RLQ tenderness.  No guarding or rebound tenderness.     Rectal: Small external hemorrhoid present which is not thrombosed.  Tight anal sphincter tone with mild tenderness.  No visible fissures.  No rectal masses.  Stool is Neumeier and Hemoccult negative. Neurologic:  Alert and oriented x3;  grossly normal neurologically. Psych:  Alert and cooperative. Normal mood and affect.  Chaperone for Exam:  Alethea Blocker, CMA   Imaging Studies: No results found.  Labs: CBC    Component Value Date/Time   WBC 5.8 07/14/2020 0411   RBC 4.57 07/14/2020 0411   HGB 12.8 07/14/2020 0411   HCT 40.7 07/14/2020 0411   PLT 324 07/14/2020 0411   MCV 89.1 07/14/2020 0411    CMP     Component Value Date/Time   NA 141 12/10/2023 0941   K 4.9 12/10/2023 0941   CL 105 12/10/2023 0941   CO2 24 12/10/2023 0941   GLUCOSE 91 12/10/2023 0941   GLUCOSE 141 (H) 07/14/2020 0411   BUN 7 12/10/2023 0941   CREATININE 0.82 12/10/2023 0941   CREATININE 0.73 06/25/2013 1600   CALCIUM 9.3 12/10/2023 0941   PROT 5.9 (L) 12/10/2023 0941   ALBUMIN 3.9 12/10/2023 0941   AST 22 12/10/2023 0941   ALT 21 12/10/2023 0941   ALKPHOS 62 12/10/2023 0941   BILITOT 0.3 12/10/2023 0941   GFRNONAA >60 07/14/2020 0411   GFRAA >60 12/29/2019 2135    Assessment and Plan:   Ruth [name] is a 43 y.o. y/o female has been referred for evaluation of lower abdominal pain and constipation.  Symptoms only occur during Menses.  Chronic for many years.  Pelvic ultrasound and GYN evaluation unrevealing.  I am ordering labs and abdominal pelvic CT for further evaluation.  Also treating constipation with follow-up.  If she develops iron deficiency anemia or rectal bleeding, then diagnostic colonoscopy would be recommended.  1.  Chronic pelvic pain.  RLQ and LLQ Pain.  Tender throughout entire Upper and lower abdomen bilaterally on exam today. - Lab CMP, CBC - Abdominal pelvic CT with contrast  2.  Constipation - Linzess 72, 145mcg daily samples.  3.  Rectal Pain: Lemmie hemorrhoid  pain. - Pending above test results, then decide if Colonoscopy is needed. - I offered hydrocortisone 2.5% cream, and patient declined.  Follow up in 2 months with TG.  Also follow-up based on test results and symptoms.  Ellouise Console, PA-C

## 2024-04-03 ENCOUNTER — Ambulatory Visit: Payer: Self-pay | Admitting: Physician Assistant

## 2024-04-04 DIAGNOSIS — F411 Generalized anxiety disorder: Secondary | ICD-10-CM | POA: Diagnosis not present

## 2024-04-06 ENCOUNTER — Ambulatory Visit (INDEPENDENT_AMBULATORY_CARE_PROVIDER_SITE_OTHER): Admitting: Physical Medicine and Rehabilitation

## 2024-04-06 ENCOUNTER — Encounter: Payer: Self-pay | Admitting: Physical Medicine and Rehabilitation

## 2024-04-06 DIAGNOSIS — R202 Paresthesia of skin: Secondary | ICD-10-CM | POA: Diagnosis not present

## 2024-04-06 DIAGNOSIS — M542 Cervicalgia: Secondary | ICD-10-CM

## 2024-04-06 DIAGNOSIS — M5412 Radiculopathy, cervical region: Secondary | ICD-10-CM | POA: Diagnosis not present

## 2024-04-06 DIAGNOSIS — G8929 Other chronic pain: Secondary | ICD-10-CM | POA: Diagnosis not present

## 2024-04-06 MED ORDER — PREGABALIN 75 MG PO CAPS: ORAL_CAPSULE | ORAL | 0 refills | Status: DC
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
## 2024-04-06 NOTE — Progress Notes (Unsigned)
 Pain Scale   Average Pain 5 Patient advising she has neck pain radiating to right shoulder and also numbness and tingling to her right arm and hand.        +Driver, -BT, -Dye Allergies.

## 2024-04-07 ENCOUNTER — Encounter: Admitting: Physical Therapy

## 2024-04-09 ENCOUNTER — Ambulatory Visit (HOSPITAL_COMMUNITY)

## 2024-04-09 ENCOUNTER — Encounter: Payer: Self-pay | Admitting: Physical Medicine and Rehabilitation

## 2024-04-09 DIAGNOSIS — F411 Generalized anxiety disorder: Secondary | ICD-10-CM | POA: Diagnosis not present

## 2024-04-10 ENCOUNTER — Telehealth: Payer: Self-pay

## 2024-04-10 NOTE — Telephone Encounter (Signed)
 Patient calls nurse line requesting advice on fluctuating BP and blood sugar levels.   Patient reports purchasing glucose meter after recent labs revealed elevated blood sugar levels. Patient reports that for the last 4 days, she has been having low blood sugar levels when she first wakes up in the morning. She reports reading the other day of 53. Blood sugar was 63 this morning.   She will drink orange juice and levels will normalize. She states that when her blood sugar gets low she will feel dizzy and very hungry.   She is not currently symptomatic. She does not have history of diabetes and does not inject insulin.   Patient also reports that BP has been elevated as high as 155/100's. She took a nap and BP normalized.  She denies headache, blurry vision, chest pain, or shortness of breath.  Scheduled patient with Dr. Rosendo on Tuesday, 04/14/24 for further assessment per patient request.   Strict ED precautions discussed.   Precepted with Dr. Delores who agreed with follow up plan.   Chiquita JAYSON English, RN

## 2024-04-11 DIAGNOSIS — F411 Generalized anxiety disorder: Secondary | ICD-10-CM | POA: Diagnosis not present

## 2024-04-12 ENCOUNTER — Emergency Department (HOSPITAL_COMMUNITY)
Admission: EM | Admit: 2024-04-12 | Discharge: 2024-04-12 | Disposition: A | Discharge status: Home / Self Care | Attending: Emergency Medicine | Admitting: Emergency Medicine

## 2024-04-12 ENCOUNTER — Encounter (HOSPITAL_COMMUNITY): Payer: Self-pay | Admitting: *Deleted

## 2024-04-12 ENCOUNTER — Other Ambulatory Visit: Payer: Self-pay

## 2024-04-12 ENCOUNTER — Emergency Department (HOSPITAL_COMMUNITY)

## 2024-04-12 DIAGNOSIS — I10 Essential (primary) hypertension: Secondary | ICD-10-CM | POA: Insufficient documentation

## 2024-04-12 DIAGNOSIS — R519 Headache, unspecified: Secondary | ICD-10-CM | POA: Insufficient documentation

## 2024-04-12 DIAGNOSIS — J45909 Unspecified asthma, uncomplicated: Secondary | ICD-10-CM | POA: Diagnosis not present

## 2024-04-12 LAB — COMPREHENSIVE METABOLIC PANEL WITH GFR
ALT: 20 U/L (ref 0–44)
AST: 19 U/L (ref 15–41)
Albumin: 3.3 g/dL — ABNORMAL LOW (ref 3.5–5.0)
Alkaline Phosphatase: 51 U/L (ref 38–126)
Anion gap: 8 (ref 5–15)
BUN: 6 mg/dL (ref 6–20)
CO2: 28 mmol/L (ref 22–32)
Calcium: 9.4 mg/dL (ref 8.9–10.3)
Chloride: 104 mmol/L (ref 98–111)
Creatinine, Ser: 0.81 mg/dL (ref 0.44–1.00)
GFR, Estimated: >60 mL/min (ref >60)
Glucose, Bld: 104 mg/dL — ABNORMAL HIGH (ref 70–99)
Potassium: 4.6 mmol/L (ref 3.5–5.1)
Sodium: 140 mmol/L (ref 135–145)
Total Bilirubin: <0.2 mg/dL (ref 0.0–1.2)
Total Protein: 6 g/dL — ABNORMAL LOW (ref 6.5–8.1)

## 2024-04-12 LAB — URINALYSIS, ROUTINE W REFLEX MICROSCOPIC
Bilirubin Urine: NEGATIVE
Glucose, UA: NEGATIVE mg/dL
Hgb urine dipstick: NEGATIVE
Ketones, ur: NEGATIVE mg/dL
Leukocytes,Ua: NEGATIVE
Nitrite: NEGATIVE
Protein, ur: NEGATIVE mg/dL
Specific Gravity, Urine: 1.017 (ref 1.005–1.030)
pH: 5 (ref 5.0–8.0)

## 2024-04-12 LAB — CBC
HCT: 43.1 % (ref 36.0–46.0)
Hemoglobin: 13.8 g/dL (ref 12.0–15.0)
MCH: 27.9 pg (ref 26.0–34.0)
MCHC: 32 g/dL (ref 30.0–36.0)
MCV: 87.1 fL (ref 80.0–100.0)
Platelets: 359 K/uL (ref 150–400)
RBC: 4.95 MIL/uL (ref 3.87–5.11)
RDW: 12.7 % (ref 11.5–15.5)
WBC: 7.2 K/uL (ref 4.0–10.5)
nRBC: 0 % (ref 0.0–0.2)

## 2024-04-12 LAB — HCG, SERUM, QUALITATIVE: Preg, Serum: NEGATIVE

## 2024-04-12 LAB — CBG MONITORING, ED: Glucose-Capillary: 96 mg/dL (ref 70–99)

## 2024-04-12 MED ORDER — ACETAMINOPHEN 325 MG PO TABS
650.0000 mg | ORAL_TABLET | Freq: Once | ORAL | Status: AC
Start: 2024-04-12 — End: 2024-04-12
  Administered 2024-04-12: 650 mg via ORAL

## 2024-04-12 MED ORDER — KETOROLAC TROMETHAMINE 15 MG/ML IJ SOLN
15.0000 mg | Freq: Once | INTRAMUSCULAR | Status: AC
  Administered 2024-04-12: 15 mg via INTRAMUSCULAR
  Filled 2024-04-12: qty 1

## 2024-04-12 MED ORDER — ACETAMINOPHEN 325 MG PO TABS
ORAL_TABLET | ORAL | Status: AC
  Filled 2024-04-12: qty 2

## 2024-04-12 NOTE — ED Provider Notes (Signed)
 [facility] EMERGENCY DEPARTMENT AT [facility] Provider Note   CSN: [id number] Arrival date & time: 04/12/24  0106     Patient presents with: Hypertension   Ruth [name] is a 43 y.o. female with history of asthma, GERD, hypertension, sciatica, cervical radiculopathy.  Patient presents to ED for evaluation of high blood pressure, high blood sugar, headache.  Reports that for the last 2 weeks she has had elevated blood pressure readings at home as well as fluctuating blood sugar levels.  She states that she does not take blood pressure medication and she has no history of diabetes.  She states that she has advised her PCP of this and she is set to see them in 2 days on 9/23.  She states that she will typically wake up in the morning and have low blood sugar as well as a headache.  She reports has been taking BC powder at home for headache but does not relieve her headache.  She states she has history of cervical radiculopathy and she is unsure if this is causing her headaches.  She states she also has blurred vision but reports recently that her ophthalmologist who gave her prescription lenses.  She denies any chest pain or shortness of breath.  She denies excessive thirst, excessive urination, excessive hunger.   Hypertension Associated symptoms include headaches.       Prior to Admission medications   Medication Sig Start Date End Date Taking? Authorizing Provider  Aspirin-Salicylamide-Caffeine (BC HEADACHE POWDER PO) Take by mouth as needed.    [provider]  pregabalin  (LYRICA ) 75 MG capsule 1 tablet (75 mg) by mouth at bedtime for one week, then twice a day. 04/06/24   Williams, Megan E, NP    Allergies: Hydrocodone-acetaminophen     Review of Systems  Eyes:  Positive for visual disturbance.  Neurological:  Positive for headaches.  All other systems reviewed and are negative.   Updated Vital Signs BP (!) 130/91   Pulse 77   Temp 98.5 F (36.9 C)   Resp 16    Ht 5' 6 (1.676 m)   Wt 94.8 kg   LMP 04/06/2024   SpO2 100%   BMI 33.73 kg/m   Physical Exam Vitals and nursing note reviewed.  Constitutional:      General: She is not in acute distress.    Appearance: She is well-developed.  HENT:     Head: Normocephalic and atraumatic.  Eyes:     Conjunctiva/sclera: Conjunctivae normal.  Cardiovascular:     Rate and Rhythm: Normal rate and regular rhythm.     Heart sounds: No murmur heard. Pulmonary:     Effort: Pulmonary effort is normal. No respiratory distress.     Breath sounds: Normal breath sounds.  Abdominal:     Palpations: Abdomen is soft.     Tenderness: There is no abdominal tenderness.  Musculoskeletal:        General: No swelling.     Cervical back: Neck supple.  Skin:    General: Skin is warm and dry.     Capillary Refill: Capillary refill takes less than 2 seconds.  Neurological:     General: No focal deficit present.     Mental Status: She is alert and oriented to person, place, and time. Mental status is at baseline.     GCS: GCS eye subscore is 4. GCS verbal subscore is 5. GCS motor subscore is 6.     Cranial Nerves: Cranial nerves 2-12 are intact. No  cranial nerve deficit.     Sensory: Sensation is intact. No sensory deficit.     Motor: Motor function is intact. No weakness.     Coordination: Coordination is intact. Heel to [facility] Test normal.     Comments: CN III through XII intact.  Intact finger-nose, heel-to-shin.  No pronator drift.  No slurred speech.  No facial droop.  Equal strength throughout.  Equal sensation throughout.  PERRL.  Tracks across midline.  Alert and oriented x 4.  Psychiatric:        Mood and Affect: Mood normal.     (all labs ordered are listed, but only abnormal results are displayed) Labs Reviewed  COMPREHENSIVE METABOLIC PANEL WITH GFR - Abnormal; Notable for the following components:      Result Value   Glucose, Bld 104 (*)    Total Protein 6.0 (*)    Albumin 3.3 (*)    All other  components within normal limits  URINALYSIS, ROUTINE W REFLEX MICROSCOPIC - Abnormal; Notable for the following components:   APPearance HAZY (*)    All other components within normal limits  CBC  HCG, SERUM, QUALITATIVE  CBG MONITORING, ED    EKG: None  Radiology: CT Head Wo Contrast Result Date: 04/12/2024 CLINICAL DATA:  43 year old female with increasing frequency and severity of headache. Blurred vision. Hypertensive at home. EXAM: CT HEAD WITHOUT CONTRAST TECHNIQUE: Contiguous axial images were obtained from the base of the skull through the vertex without intravenous contrast. RADIATION DOSE REDUCTION: This exam was performed according to the departmental dose-optimization program which includes automated exposure control, adjustment of the mA and/or kV according to patient size and/or use of iterative reconstruction technique. COMPARISON:  None Available. FINDINGS: Brain: Normal cerebral volume. No midline shift, ventriculomegaly, mass effect, evidence of mass lesion, intracranial hemorrhage or evidence of cortically based acute infarction. Gray-white matter differentiation is within normal limits throughout the brain. Vascular: No suspicious intracranial vascular hyperdensity. Skull: Intact, negative. Sinuses/Orbits: Visualized paranasal sinuses and mastoids are clear. Other: Visualized orbits and scalp soft tissues are within normal limits. IMPRESSION: Normal noncontrast Head CT. Electronically Signed   By: VEAR Hurst M.D.   On: 04/12/2024 05:02    Procedures   Medications Ordered in the ED  acetaminophen  (TYLENOL ) tablet 650 mg (650 mg Oral Given 04/12/24 0223)  ketorolac  (TORADOL ) 15 MG/ML injection 15 mg (15 mg Intramuscular Given 04/12/24 0447)      Medical Decision Making Amount and/or Complexity of Data Reviewed Radiology: ordered.  Risk Prescription drug management.   This is a 43 year old female presenting to the ED with concerns of high blood sugar and high blood  pressure.  On arrival, she is hemodynamically stable.  She is afebrile and nontachycardic.  Her lung sounds are clear bilaterally, she is not hypoxic.  Her abdomen is soft and compressible.  Her neurological examination is at baseline without focal neurodeficits.  Overall she is nontoxic in appearance.  Patient assessed utilizing CBC, CMP, serum qualitative hCG, urinalysis, CBG monitoring.  I have added on CT scan the patient had due to blurred vision and headache.  Patient headache was treated with Tylenol , Toradol .  CBC here without leukocytosis or anemia.  Metabolic panel is grossly remarkable without any electrolyte derangement, no elevated LFT, anion gap 8.  Glucose is 104.  Urinalysis negative for all.  hCG serum qualitative negative.  CBG monitoring here 96.  Patient CT head collected due to blurred vision and headache.  Patient CT head unremarkable.  Her current blood  pressure is 130/91.  At this time the patient work is reassuring.  She reports her blurred vision has been ongoing for quite some time and she has been seen by ophthalmology who prescribed her with prescription lenses.  She reports she has follow-up with PCP in 2 days on 23 September.  I advised the patient to continue to monitor blood sugar and blood pressure at home.  I provided her with a record sheet so she can track and trend her blood pressure.  I advised her to continue to monitor blood sugar at home.  She was given return precautions and she voiced understanding.  She is stable to discharge at this time.    Final diagnoses:  Asymptomatic hypertension  Nonintractable headache, unspecified chronicity pattern, unspecified headache type    ED Discharge Orders     None          Ruthell Lonni JULIANNA DEVONNA 04/12/24 [id number]    Lorette Mayo, MD 04/12/24 [phone]

## 2024-04-12 NOTE — ED Notes (Signed)
Patient verbalizes understanding of discharge instructions. Opportunity for questioning and answers were provided. Armband removed by staff, pt discharged from ED. Ambulated out to lobby with friend

## 2024-04-12 NOTE — ED Triage Notes (Signed)
 The pt is c/o her bp has been high and her blood  and her blood sugar has been up and down for several weeks

## 2024-04-12 NOTE — Discharge Instructions (Addendum)
 It was a pleasure taking part in your care.  As we discussed, your work appears reassuring.  Please follow-up with your PCP in 2 days for reevaluation.  Please continue to monitor your blood pressure at home.  Please also continue to monitor your blood sugar at home.  Please write down your blood pressure values and take these to your PCP for evaluation.  If you develop headache, chest pain, shortness of breath or blurred vision in the setting of a blood pressure greater than 175 on the top number please report to the ED.

## 2024-04-14 ENCOUNTER — Ambulatory Visit (INDEPENDENT_AMBULATORY_CARE_PROVIDER_SITE_OTHER): Admitting: Student

## 2024-04-14 ENCOUNTER — Ambulatory Visit (HOSPITAL_COMMUNITY)
Admission: RE | Admit: 2024-04-14 | Discharge: 2024-04-14 | Disposition: A | Discharge status: Home / Self Care | Source: Ambulatory Visit | Attending: Physician Assistant | Admitting: Physician Assistant

## 2024-04-14 ENCOUNTER — Encounter: Payer: Self-pay | Admitting: Student

## 2024-04-14 VITALS — BP 126/80 | HR 96 | Wt 206.2 lb

## 2024-04-14 DIAGNOSIS — R1032 Left lower quadrant pain: Secondary | ICD-10-CM | POA: Insufficient documentation

## 2024-04-14 DIAGNOSIS — Z9049 Acquired absence of other specified parts of digestive tract: Secondary | ICD-10-CM | POA: Diagnosis not present

## 2024-04-14 DIAGNOSIS — E669 Obesity, unspecified: Secondary | ICD-10-CM | POA: Diagnosis not present

## 2024-04-14 DIAGNOSIS — R1031 Right lower quadrant pain: Secondary | ICD-10-CM | POA: Insufficient documentation

## 2024-04-14 DIAGNOSIS — N3289 Other specified disorders of bladder: Secondary | ICD-10-CM | POA: Diagnosis not present

## 2024-04-14 DIAGNOSIS — E162 Hypoglycemia, unspecified: Secondary | ICD-10-CM

## 2024-04-14 DIAGNOSIS — R102 Pelvic and perineal pain: Secondary | ICD-10-CM | POA: Diagnosis not present

## 2024-04-14 LAB — POCT GLYCOSYLATED HEMOGLOBIN (HGB A1C): Hemoglobin A1C: 5.4 % (ref 4.0–5.6)

## 2024-04-14 MED ORDER — IOHEXOL 300 MG/ML  SOLN
75.0000 mL | Freq: Once | INTRAMUSCULAR | Status: AC | PRN
  Administered 2024-04-14: 75 mL via INTRAVENOUS

## 2024-04-14 NOTE — Patient Instructions (Addendum)
 Pleasure to meet you today.  Your A1c today was 5.4%.  Which is normal range.  Mediterranean Diet A Mediterranean diet is based on the traditions of countries on the [employer]. It focuses on eating more: Fruits and vegetables. Whole grains, beans, nuts, and seeds. Heart-healthy fats. These are fats that are good for your heart. It involves eating less: Dairy. Meat and eggs. Processed foods with added sugar, salt, and fat. This type of diet can help prevent certain conditions. It can also improve outcomes if you have a long-term (chronic) disease, such as kidney or heart disease. What are tips for following this plan? Reading food labels Check packaged foods for: The serving size. For foods such as rice and pasta, the serving size is the amount of cooked product, not dry. The total fat. Avoid foods with saturated fat or trans fat. Added sugars, such as corn syrup. Shopping  Try to have a balanced diet. Buy a variety of foods, such as: Fresh fruits and vegetables. You may be able to get these from local farmers markets. You can also buy them frozen. Grains, beans, nuts, and seeds. Some of these can be bought in bulk. Fresh seafood. Poultry and eggs. Low-fat dairy products. Buy whole ingredients instead of foods that have already been packaged. If you can't get fresh seafood, buy precooked frozen shrimp or canned fish, such as tuna, salmon, or sardines. Stock your pantry so you always have certain foods on hand, such as olive oil, canned tuna, canned tomatoes, rice, pasta, and beans. Cooking Cook foods with extra-virgin olive oil instead of using butter or other vegetable oils. Have meat as a side dish. Have vegetables or grains as your main dish. This means having meat in small portions or adding small amounts of meat to foods like pasta or stew. Use beans or vegetables instead of meat in common dishes like chili or lasagna. Try out different cooking methods. Try roasting,  broiling, steaming, and sauting vegetables. Add frozen vegetables to soups, stews, pasta, or rice. Add nuts or seeds for added healthy fats and plant protein at each meal. You can add these to yogurt, salads, or vegetable dishes. Marinate fish or vegetables using olive oil, lemon juice, garlic, and fresh herbs. Meal planning Plan to eat a vegetarian meal one day each week. Try to work up to two vegetarian meals, if possible. Eat seafood two or more times a week. Have healthy snacks on hand. These may include: Vegetable sticks with hummus. Greek yogurt. Fruit and nut trail mix. Eat balanced meals. These should include: Fruit: 2-3 servings a day. Vegetables: 4-5 servings a day. Low-fat dairy: 2 servings a day. Fish, poultry, or lean meat: 1 serving a day. Beans and legumes: 2 or more servings a week. Nuts and seeds: 1-2 servings a day. Whole grains: 6-8 servings a day. Extra-virgin olive oil: 3-4 servings a day. Limit red meat and sweets to just a few servings a month. Lifestyle  Try to cook and eat meals with your family. Drink enough fluid to keep your pee (urine) pale yellow. Be active every day. This includes: Aerobic exercise, which is exercise that causes your heart to beat faster. Examples include running and swimming. Leisure activities like gardening, walking, or housework. Get 7-8 hours of sleep each night. Drink red wine if your provider says you can. A glass of wine is 5 oz (150 mL). You may be allowed to have: Up to 1 glass a day if you're female and not pregnant. Up  to 2 glasses a day if you're female. What foods should I eat? Fruits Apples. Apricots. Avocado. Berries. Bananas. Cherries. Dates. Figs. Grapes. Lemons. Melon. Oranges. Peaches. Plums. Pomegranate. Vegetables Artichokes. Beets. Broccoli. Cabbage. Carrots. Eggplant. Green beans. Chard. Kale. Spinach. Onions. Leeks. Peas. Squash. Tomatoes. Peppers. Radishes. Grains Whole-grain pasta. Irani rice. Bulgur  wheat. Polenta. Couscous. Whole-wheat bread. [place] Madeira. Meats and other proteins Beans. Almonds. Sunflower seeds. Pine nuts. Peanuts. Cod. Salmon. Scallops. Shrimp. Tuna. Tilapia. Clams. Oysters. Eggs. Chicken or [place] without skin. Dairy Low-fat milk. Cheese. Greek yogurt. Fats and oils Extra-virgin olive oil. Avocado oil. Grapeseed oil. Beverages Water. Red wine. Herbal tea. Sweets and desserts Greek yogurt with honey. Baked apples. Poached pears. Trail mix. Seasonings and condiments Basil. Cilantro. Coriander. Cumin. Mint. Parsley. Sage. Rosemary. Tarragon. Garlic. Oregano. Thyme. Pepper. Balsamic vinegar. Tahini. Hummus. Tomato sauce. Olives. Mushrooms. The items listed above may not be all the foods and drinks you can have. Talk to a dietitian to learn more. What foods should I limit? This is a list of foods that should be eaten rarely. Fruits Fruit canned in syrup. Vegetables Deep-fried potatoes, like [place] fries. Grains Packaged pasta or rice dishes. Cereal with added sugar. Snacks with added sugar. Meats and other proteins Beef. Pork. Lamb. Chicken or [place] with skin. Hot dogs. [place]. Dairy Ice cream. Sour cream. Whole milk. Fats and oils Butter. Canola oil. Vegetable oil. Beef fat (tallow). Lard. Beverages Juice. Sugar-sweetened soft drinks. Beer. Liquor and spirits. Sweets and desserts Cookies. Cakes. Pies. Candy. Seasonings and condiments Mayonnaise. Pre-made sauces and marinades. The items listed above may not be all the foods and drinks you should limit. Talk to a dietitian to learn more. Where to find more information American Heart Association (AHA): heart.org This information is not intended to replace advice given to you by your health care provider. Make sure you discuss any questions you have with your health care provider. Document Revised: 10/21/2022 Document Reviewed: 10/21/2022 Elsevier Patient Education  2024 [employer].

## 2024-04-14 NOTE — Progress Notes (Signed)
    SUBJECTIVE:   CHIEF COMPLAINT / HPI:   43 year old female with history of asthma and obesity Presenting today for evaluation of blood sugar Patient reports she has been checking her blood sugars recently and they have been low in the 50s/60s She has been noticing feeling slightly dizzy and jittery prompting her to obtain a glucometer No history of diabetes Noticed low blood sugar readings after making adjustments for her diet for weight loss Currently mostly eating fruits and vegetable twice a day and occasional smoothies    PERTINENT  PMH / PSH: Reviewed   OBJECTIVE:   BP 126/80   Pulse 96   Wt 206 lb 3.2 oz (93.5 kg)   LMP 04/06/2024   SpO2 95%   BMI 33.28 kg/m    Physical Exam General: Alert, well appearing, NAD Cardiovascular: RRR, No Murmurs, Normal S2/S2 Respiratory: CTAB, No wheezing or Rales Abdomen: No distension or tenderness Extremities: No edema on extremities    ASSESSMENT/PLAN:   Hypoglycemic readings A1c today was 5.4%.  Given patient's history suspect hypoglycemic readings most likely due to decreased food intake and patient's current diet with mostly fruits and vegetables. - Obtain A1c - Advised patient to increase food intake, can incorporate wheat bread, Thompson rice or beans to current diet -Reecommend Mediterranean diet as this Is healthier and can help with weight loss but also would help maintain her blood sugar  Hypertensive blood pressure readings Patient reports she is occasionally had elevated blood pressures at home.  BP today within normal range.  Encourage patient to check her blood pressures daily and if elevated above 140/90 consistently patient to return for further evaluation.  She has been advised to keep a daily blood pressure logs.  Norleen April, MD [facility]

## 2024-04-15 ENCOUNTER — Inpatient Hospital Stay
Admission: RE | Admit: 2024-04-15 | Discharge: 2024-04-15 | Discharge status: Home / Self Care | Source: Ambulatory Visit | Attending: Physical Medicine and Rehabilitation | Admitting: Physical Medicine and Rehabilitation

## 2024-04-15 NOTE — Therapy (Deleted)
 OUTPATIENT PHYSICAL THERAPY FEMALE PELVIC EVALUATION   [name] MRN: [id number] DOB:03/09/1981, 43 y.o., female Today's Date: 04/15/2024  END OF SESSION:   Past Medical History:  Diagnosis Date   Asthma    COVID-19 07/17/2020   GERD (gastroesophageal reflux disease)    Hypertension    HTN with pregnancy    Pneumonia    2013   Past Surgical History:  Procedure Laterality Date   CHOLECYSTECTOMY  10/31/2011   Procedure: LAPAROSCOPIC CHOLECYSTECTOMY;  Surgeon: Dann FORBES Hummer, MD;  Location: [facility];  Service: General;  Laterality: N/A;   DILATION AND CURETTAGE OF UTERUS     ENDOMETRIAL ABLATION N/A 09/21/2020   Procedure: ENDOMETRIAL ABLATION WITH NOVASURE;  Surgeon: Eveline Lynwood MATSU, MD;  Location: [facility];  Service: Gynecology;  Laterality: N/A;   KNEE SURGERY Left 08/2014   LAPAROSCOPIC TUBAL LIGATION Bilateral 07/24/2016   Procedure: LAPAROSCOPIC TUBAL LIGATION;  Surgeon: Lynwood MATSU Eveline, MD;  Location: [facility];  Service: Gynecology;  Laterality: Bilateral;   WISDOM TOOTH EXTRACTION     Patient Active Problem List   Diagnosis Date Noted   Radiculopathy, cervical region 04/06/2024   Pinched nerve 01/09/2024   Sciatica 03/27/2022   Sacroiliitis 05/10/2021   S/P endometrial ablation 05/10/2021   Obesity (BMI 30-39.9) 06/25/2013   ASTHMA, INTERMITTENT 02/19/2007    PCP: Theophilus Pagan, MD  REFERRING PROVIDER: JERALYN CRUTCH   REFERRING DIAG: M79.10 (ICD-10-CM) - Myalgia K59.02 (ICD-10-CM) - Constipation due to outlet dysfunction   THERAPY DIAG:  No diagnosis found.  Rationale for Evaluation and Treatment: Rehabilitation  ONSET DATE: ***  SUBJECTIVE:                                                                                                                                                                                           SUBJECTIVE STATEMENT: pelvic pain and bowel movement difficulties following a uterine ablation.   Fluid intake:   FUNCTIONAL LIMITATIONS: ***  PERTINENT HISTORY:  Medications for current condition: *** Surgeries: uterine ablation, gallbladder surgery, and two knee surgeries  Other: *** Sexual abuse: {Yes/No:304960894}  DIAGNOSTIC FINDINGS:  Post-void residual: Voiding Cystourethrogram (VCUG):  Ultrasound: PAIN:  Are you having pain? {yes/no:20286} NPRS scale: ***/10 Pain location: {pelvic pain location:27098}  Pain type: {type:313116} Pain description: {PAIN DESCRIPTION:21022940}   Aggravating factors: *** Relieving factors: ***  PRECAUTIONS: None  RED FLAGS: {PT Red Flags:29287}   WEIGHT BEARING RESTRICTIONS: No  FALLS:  Has patient fallen in last 6 months? {fallsyesno:27318}  OCCUPATION: ***  ACTIVITY LEVEL : ***  PLOF: {PLOF:24004}  PATIENT GOALS: ***   BOWEL MOVEMENT: Pain with bowel movement: {  yes/no:20286} Type of bowel movement:{PT BM type:27100} Fully empty rectum: {No/Yes:304960894} Leakage: {Yes/No:304960894}                                                     Caused by: *** Pads: {Yes/No:304960894} Fiber supplement/laxative {YES/NO AS:20300}  URINATION: Pain with urination: {yes/no:20286} Fully empty bladder: {Yes/No:304960894}***                                Post-void dribble: {YES/NO AS:20300} Stream: {PT urination:27102} Urgency: {YES/NO AS:20300} Frequency:during the day ***                                                         Nocturia: {Yes/No:304960894}***   Leakage: {PT leakage:27103} Pads/briefs: {Yes/No:304960894}  INTERCOURSE:  Ability to have vaginal penetration {YES/NO:21197} Pain with intercourse: {pain with intercourse PA:27099} Dryness: {YES/NO AS:20300} Climax: *** Marinoff Scale: ***/3 Lubricant:  PREGNANCY: Vaginal deliveries *** Tearing {Yes***/No:304960894} Episiotomy {YES/NO AS:20300} C-section deliveries *** Currently pregnant {Yes***/No:304960894}  PROLAPSE: {PT prolapse:27101}   OBJECTIVE:   Note: Objective measures were completed at Evaluation unless otherwise noted.  DIAGNOSTIC FINDINGS:  ***  PATIENT SURVEYS:  {rehab surveys:24030}  PFIQ-7: ***  COGNITION: Overall cognitive status: {cognition:24006}     SENSATION: Light touch: {intact/deficits:24005}  LUMBAR SPECIAL TESTS:  {lumbar special test:25242}  FUNCTIONAL TESTS:  {Functional tests:24029} Single leg stance:  Rt:  Lt: Sit-up test: Squat: Bed mobility:  GAIT: Assistive device utilized: {Assistive devices:23999} Comments: ***  POSTURE: {posture:25561}   LUMBARAROM/PROM:  A/PROM A/PROM  Eval (% available)  Flexion   Extension   Right lateral flexion   Left lateral flexion   Right rotation   Left rotation    (Blank rows = not tested)  LOWER EXTREMITY ROM:  {AROM/PROM:27142} ROM Right eval Left eval  Hip flexion    Hip extension    Hip abduction    Hip adduction    Hip internal rotation    Hip external rotation    Knee flexion    Knee extension    Ankle dorsiflexion    Ankle plantarflexion    Ankle inversion    Ankle eversion     (Blank rows = not tested)  LOWER EXTREMITY MMT:  MMT Right eval Left eval  Hip flexion    Hip extension    Hip abduction    Hip adduction    Hip internal rotation    Hip external rotation    Knee flexion    Knee extension    Ankle dorsiflexion    Ankle plantarflexion    Ankle inversion    Ankle eversion     (Blank rows = not tested) PALPATION:  General: ***  Pelvic Alignment: ***  Abdominal: ***  Diastasis: {Yes/No:304960894}*** Distortion: {YES/NO AS:20300}  Breathing: *** Scar tissue: {Yes/No:304960894}***                External Perineal Exam: ***                             Internal Pelvic Floor: ***  Patient confirms identification and approves  PT to assess internal pelvic floor and treatment {yes/no:20286}  PELVIC MMT:   MMT eval  Vaginal   Internal Anal Sphincter   External Anal Sphincter   Puborectalis    Diastasis Recti   (Blank rows = not tested)        TONE: ***  PROLAPSE: ***  TODAY'S TREATMENT:                                                                                                                              DATE: ***  EVAL ***   PATIENT EDUCATION:  Education details: *** Person educated: {Person educated:25204} Education method: {Education Method:25205} Education comprehension: {Education Comprehension:25206}  HOME EXERCISE PROGRAM: ***  ASSESSMENT:  CLINICAL IMPRESSION: Patient is a *** y.o. *** who was seen today for physical therapy evaluation and treatment for ***.   OBJECTIVE IMPAIRMENTS: {opptimpairments:25111}.   ACTIVITY LIMITATIONS: {activitylimitations:27494}  PARTICIPATION LIMITATIONS: {participationrestrictions:25113}  PERSONAL FACTORS: {Personal factors:25162} are also affecting patient's functional outcome.   REHAB POTENTIAL: {rehabpotential:25112}  CLINICAL DECISION MAKING: {clinical decision making:25114}  EVALUATION COMPLEXITY: {Evaluation complexity:25115}   GOALS: Goals reviewed with patient? {yes/no:20286}  SHORT TERM GOALS: Target date: ***  *** Baseline: Goal status: INITIAL  2.  *** Baseline:  Goal status: INITIAL  3.  *** Baseline:  Goal status: INITIAL  4.  *** Baseline:  Goal status: INITIAL  5.  *** Baseline:  Goal status: INITIAL  6.  *** Baseline:  Goal status: INITIAL  LONG TERM GOALS: Target date: ***  *** Baseline:  Goal status: INITIAL  2.  *** Baseline:  Goal status: INITIAL  3.  *** Baseline:  Goal status: INITIAL  4.  *** Baseline:  Goal status: INITIAL  5.  *** Baseline:  Goal status: INITIAL  6.  *** Baseline:  Goal status: INITIAL  PLAN:  PT FREQUENCY: {rehab frequency:25116}  PT DURATION: {rehab duration:25117}  PLANNED INTERVENTIONS: {rehab planned interventions:25118::97110-Therapeutic exercises,97530- Therapeutic [phone]- Neuromuscular  re-education,97535- Self Rjmz,02859- Manual therapy,Patient/Family education}  PLAN FOR NEXT SESSION: ***   Teshawn Moan, PT 04/15/2024, 10:41 AM

## 2024-04-16 ENCOUNTER — Telehealth: Payer: Self-pay | Admitting: Physical Therapy

## 2024-04-16 ENCOUNTER — Encounter: Attending: Obstetrics and Gynecology | Admitting: Physical Therapy

## 2024-04-16 DIAGNOSIS — F411 Generalized anxiety disorder: Secondary | ICD-10-CM | POA: Diagnosis not present

## 2024-04-16 NOTE — Telephone Encounter (Signed)
 Called patient about her missed evaluation today at 8:30. Left a message.  Channing Pereyra, PT @9 /25/25@ 9:03 AM

## 2024-04-18 DIAGNOSIS — F411 Generalized anxiety disorder: Secondary | ICD-10-CM | POA: Diagnosis not present

## 2024-04-23 ENCOUNTER — Encounter: Admitting: Physical Therapy

## 2024-04-30 ENCOUNTER — Encounter: Admitting: Physical Therapy

## 2024-05-04 ENCOUNTER — Telehealth: Payer: Self-pay

## 2024-05-04 NOTE — Telephone Encounter (Signed)
 I called patient to see if she's available for surgery w/ Dr. Jeralyn on 07/22/24. I left a voicemail requesting she call me back to schedule.

## 2024-05-25 ENCOUNTER — Encounter: Payer: Self-pay | Admitting: Radiology

## 2024-05-26 ENCOUNTER — Telehealth: Payer: Self-pay

## 2024-05-26 NOTE — Telephone Encounter (Signed)
 I called patient to schedule surgery w/ Dr. Jeralyn. Patient stated she has decided not to proceed w/surgery.

## 2024-05-29 DIAGNOSIS — F411 Generalized anxiety disorder: Secondary | ICD-10-CM | POA: Diagnosis not present

## 2024-06-02 ENCOUNTER — Ambulatory Visit (INDEPENDENT_AMBULATORY_CARE_PROVIDER_SITE_OTHER): Admitting: Physician Assistant

## 2024-06-02 ENCOUNTER — Encounter: Payer: Self-pay | Admitting: Physician Assistant

## 2024-06-02 VITALS — BP 104/76 | HR 90 | Ht 67.0 in | Wt 214.4 lb

## 2024-06-02 DIAGNOSIS — K5904 Chronic idiopathic constipation: Secondary | ICD-10-CM

## 2024-06-02 DIAGNOSIS — R109 Unspecified abdominal pain: Secondary | ICD-10-CM

## 2024-06-02 DIAGNOSIS — K581 Irritable bowel syndrome with constipation: Secondary | ICD-10-CM

## 2024-06-02 MED ORDER — DICYCLOMINE HCL 10 MG PO CAPS: 10.0000 mg | ORAL_CAPSULE | Freq: Three times a day (TID) | ORAL | 5 refills | Status: AC | PRN

## 2024-06-02 MED ORDER — LINACLOTIDE 72 MCG PO CAPS: 72.0000 ug | ORAL_CAPSULE | Freq: Every day | ORAL | 3 refills | Status: AC

## 2024-06-02 NOTE — Patient Instructions (Addendum)
 You are due for a Colonoscopy in January of 2027. We will give you a call to schedule your yearly follow up in November of 2026.  We have sent the following medications to your pharmacy for you to pick up at your convenience: Dicyclomine 10 mg three times daily as needed and Linzess 72 mcg once daily  Please follow up sooner if symptoms increase or worsen  Due to recent changes in healthcare laws, you may see the results of your imaging and laboratory studies on MyChart before your provider has had a chance to review them.  We understand that in some cases there may be results that are confusing or concerning to you. Not all laboratory results come back in the same time frame and the provider may be waiting for multiple results in order to interpret others.  Please give us  48 hours in order for your provider to thoroughly review all the results before contacting the office for clarification of your results.   Thank you for trusting me with your gastrointestinal care!   Ellouise Console, PA-C _______________________________________________________  If your blood pressure at your visit was 140/90 or greater, please contact your primary care physician to follow up on this.  _______________________________________________________  If you are age 70 or older, your body mass index should be between 23-30. Your Body mass index is 33.58 kg/m. If this is out of the aforementioned range listed, please consider follow up with your Primary Care Provider.  If you are age 22 or younger, your body mass index should be between 19-25. Your Body mass index is 33.58 kg/m. If this is out of the aformentioned range listed, please consider follow up with your Primary Care Provider.   ________________________________________________________  The [facility] providers would like to encourage you to use MYCHART to communicate with providers for non-urgent requests or questions.  Due to long hold times on the telephone,  sending your provider a message by [facility] may be a faster and more efficient way to get a response.  Please allow 48 business hours for a response.  Please remember that this is for non-urgent requests.  _______________________________________________________

## 2024-06-02 NOTE — Progress Notes (Signed)
 Ruth Console, PA-C [address] [place], [place]  [address] Phone: [phone]   Primary Care Physician: Theophilus Pagan, MD  Primary Gastroenterologist:  Ruth Console, PA-C / Dr. Gordy Starch   Chief Complaint: Follow-up lower abdominal pain and constipation       HPI:   Discussed the use of AI scribe software for clinical note transcription with the patient, who gave verbal consent to proceed.  History of Present Illness Ruth [name] is a 43 year old female with a history of endometriosis who presents for follow-up of lower abdominal pain and constipation.  She experiences lower abdominal pain and constipation, for which she uses Linzess 72 mcg daily at the lowest dose. She takes it before her menstrual cycle, finding it effective in facilitating bowel movements without causing cramping or diarrhea. The stool is soft but not loose, and she appreciates that the medication allows her to have a bowel movement within 10 to 20 minutes, aligning well with her work schedule.  She has a history of using various laxatives, but Linzess has been the most effective. Relief is achieved with one good bowel movement, sometimes followed by another within an hour. She has not tried a higher dose of Linzess, as she is satisfied with the current results.  She discusses her menstrual cycle, noting significant pain during PMS, which was not present before. Menstrual bleeding has decreased, now only requiring a panty liner, and she questions if she might be experiencing early menopause. She reports hot flashes and sleeps with a fan.  She has a history of endometriosis, diagnosed through surgery, with relief experienced for three to four years post-surgery. She canceled a recent planned hysterectomy, hoping to manage symptoms without surgery. She expresses concern about the pain, describing it as severe enough to feel like she might pass out or that something is 'falling'.  She does have a follow-up  with her gynecologist in the next few months.  04/11/2024 CT abdomen pelvis with contrast: 1. No acute abnormality in the abdomen or pelvis. Please note if concern for endometriosis pelvic MRI with and without contrast is a more sensitive and specific examination. 2. Prominent gonadal veins and pelvic collateral vessels, which can be seen in the setting of pelvic congestion syndrome. 3. Small volume pelvic free fluid is within physiologic normal limits.  04/12/2024 labs: Normal CBC, CMP, and UA.  Negative hCG.  Surgical history: uterine ablation 09/2020. Cholecystectomy 10/2011. BTL 2018.  Followed by GYN for chronic pelvic pain. Has 4 children.   Current Outpatient Medications  Medication Sig Dispense Refill   Aspirin-Salicylamide-Caffeine (BC HEADACHE POWDER PO) Take by mouth as needed.     linaCLOtide (LINZESS PO) Take 1 Capful by mouth as needed.     No current facility-administered medications for this visit.    Allergies as of 06/02/2024 - Review Complete 06/02/2024  Allergen Reaction Noted   Hydrocodone-acetaminophen  Nausea And Vomiting and Swelling 10/01/2019    Past Medical History:  Diagnosis Date   Asthma    COVID-19 07/17/2020   GERD (gastroesophageal reflux disease)    Hypertension    HTN with pregnancy    Pneumonia    2013    Past Surgical History:  Procedure Laterality Date   CHOLECYSTECTOMY  10/31/2011   Procedure: LAPAROSCOPIC CHOLECYSTECTOMY;  Surgeon: Dann FORBES Hummer, MD;  Location: [facility];  Service: General;  Laterality: N/A;   DILATION AND CURETTAGE OF UTERUS     ENDOMETRIAL ABLATION N/A 09/21/2020   Procedure: ENDOMETRIAL  ABLATION WITH NOVASURE;  Surgeon: Eveline Lynwood MATSU, MD;  Location: [facility];  Service: Gynecology;  Laterality: N/A;   KNEE SURGERY Left 08/2014   LAPAROSCOPIC TUBAL LIGATION Bilateral 07/24/2016   Procedure: LAPAROSCOPIC TUBAL LIGATION;  Surgeon: Lynwood MATSU Eveline, MD;  Location: [facility];  Service: Gynecology;  Laterality:  Bilateral;   WISDOM TOOTH EXTRACTION      Review of Systems:    All systems reviewed and negative except where noted in HPI.    Physical Exam:  BP 104/76   Pulse 90   Ht 5' 7 (1.702 m)   Wt 214 lb 6 oz (97.2 kg)   SpO2 97%   BMI 33.58 kg/m  No LMP recorded. (Menstrual status: Bilateral Tubal Ligation).  General: Well-nourished, well-developed in no acute distress.  Neuro: Alert and oriented x 3.  Grossly intact.  Psych: Alert and cooperative, normal mood and affect.   Imaging Studies: No results found.  Labs: CBC    Component Value Date/Time   WBC 7.2 04/12/2024 0215   RBC 4.95 04/12/2024 0215   HGB 13.8 04/12/2024 0215   HCT 43.1 04/12/2024 0215   PLT 359 04/12/2024 0215   MCV 87.1 04/12/2024 0215   MCH 27.9 04/12/2024 0215   MCHC 32.0 04/12/2024 0215   RDW 12.7 04/12/2024 0215   LYMPHSABS 1.3 04/02/2024 0946   MONOABS 0.4 04/02/2024 0946   EOSABS 0.1 04/02/2024 0946   BASOSABS 0.0 04/02/2024 0946    CMP     Component Value Date/Time   NA 140 04/12/2024 0215   NA 141 12/10/2023 0941   K 4.6 04/12/2024 0215   CL 104 04/12/2024 0215   CO2 28 04/12/2024 0215   GLUCOSE 104 (H) 04/12/2024 0215   BUN 6 04/12/2024 0215   BUN 7 12/10/2023 0941   CREATININE 0.81 04/12/2024 0215   CREATININE 0.73 06/25/2013 1600   CALCIUM 9.4 04/12/2024 0215   PROT 6.0 (L) 04/12/2024 0215   PROT 5.9 (L) 12/10/2023 0941   ALBUMIN 3.3 (L) 04/12/2024 0215   ALBUMIN 3.9 12/10/2023 0941   AST 19 04/12/2024 0215   ALT 20 04/12/2024 0215   ALKPHOS 51 04/12/2024 0215   BILITOT <0.2 04/12/2024 0215   BILITOT 0.3 12/10/2023 0941   GFRNONAA >60 04/12/2024 0215   GFRAA >60 12/29/2019 2135       Assessment and Plan:   Ruth [name] is a 43 y.o. y/o female returns for follow-up of: Assessment & Plan 1.  Chronic pelvic pain associated with constipation versus endometriosis versus dysmenorrhea.  Possible irritable bowel syndrome with constipation. Abdominal pelvic CT ruled out  diverticulitis, appendicitis, and intestinal inflammation. Linzess 72 has been effective for constipation.  - Refilled Linzess 72 mcg 1 capsule daily, #90, 3 refills prescription for one year. - Prescribed dicyclomine 10 mg 1 tablet 3 times daily as needed for cramping. - Advised gynecologist follow-up if symptoms persist or worsen. - Reassurance regarding recent abdominal pelvic CT and lab results.  2.  Colon cancer screening - For screening colonoscopy will be due at age 64 (December 2026). - Return for follow-up office visit 1 year to refill Linzess and schedule colonoscopy.  3.  Perimenopausal symptoms Possible perimenopausal symptoms with reduced menstrual bleeding and hot flashes. Early menopause considered despite young age. - Continue gynecologist follow-up.    Ruth Console, PA-C  Follow up 1 year or sooner if symptoms worsen.

## 2024-06-04 DIAGNOSIS — F411 Generalized anxiety disorder: Secondary | ICD-10-CM | POA: Diagnosis not present

## 2024-06-06 DIAGNOSIS — F411 Generalized anxiety disorder: Secondary | ICD-10-CM | POA: Diagnosis not present

## 2024-06-11 DIAGNOSIS — F411 Generalized anxiety disorder: Secondary | ICD-10-CM | POA: Diagnosis not present

## 2024-06-16 DIAGNOSIS — F411 Generalized anxiety disorder: Secondary | ICD-10-CM | POA: Diagnosis not present

## 2024-06-23 DIAGNOSIS — F411 Generalized anxiety disorder: Secondary | ICD-10-CM | POA: Diagnosis not present

## 2024-06-27 DIAGNOSIS — F411 Generalized anxiety disorder: Secondary | ICD-10-CM | POA: Diagnosis not present

## 2024-07-04 DIAGNOSIS — F411 Generalized anxiety disorder: Secondary | ICD-10-CM | POA: Diagnosis not present

## 2024-07-09 DIAGNOSIS — F411 Generalized anxiety disorder: Secondary | ICD-10-CM | POA: Diagnosis not present

## 2024-07-18 DIAGNOSIS — F411 Generalized anxiety disorder: Secondary | ICD-10-CM | POA: Diagnosis not present

## 2024-07-21 ENCOUNTER — Emergency Department (HOSPITAL_COMMUNITY)
Admission: EM | Admit: 2024-07-21 | Discharge: 2024-07-21 | Discharge status: Against Medical Advice | Attending: Emergency Medicine | Admitting: Emergency Medicine

## 2024-07-21 ENCOUNTER — Encounter (HOSPITAL_COMMUNITY): Payer: Self-pay

## 2024-07-21 ENCOUNTER — Other Ambulatory Visit: Payer: Self-pay

## 2024-07-21 DIAGNOSIS — R42 Dizziness and giddiness: Secondary | ICD-10-CM | POA: Diagnosis not present

## 2024-07-21 DIAGNOSIS — Z5321 Procedure and treatment not carried out due to patient leaving prior to being seen by health care provider: Secondary | ICD-10-CM | POA: Insufficient documentation

## 2024-07-21 DIAGNOSIS — R519 Headache, unspecified: Secondary | ICD-10-CM | POA: Diagnosis present

## 2024-07-21 DIAGNOSIS — H53149 Visual discomfort, unspecified: Secondary | ICD-10-CM | POA: Insufficient documentation

## 2024-07-21 DIAGNOSIS — R110 Nausea: Secondary | ICD-10-CM | POA: Insufficient documentation

## 2024-07-21 LAB — BASIC METABOLIC PANEL WITH GFR
Anion gap: 7 (ref 5–15)
BUN: 5 mg/dL — ABNORMAL LOW (ref 6–20)
CO2: 26 mmol/L (ref 22–32)
Calcium: 8.6 mg/dL — ABNORMAL LOW (ref 8.9–10.3)
Chloride: 106 mmol/L (ref 98–111)
Creatinine, Ser: 0.75 mg/dL (ref 0.44–1.00)
GFR, Estimated: >60 mL/min
Glucose, Bld: 90 mg/dL (ref 70–99)
Potassium: 4.7 mmol/L (ref 3.5–5.1)
Sodium: 139 mmol/L (ref 135–145)

## 2024-07-21 LAB — CBC WITH DIFFERENTIAL/PLATELET
Abs Immature Granulocytes: 0.04 K/uL (ref 0.00–0.07)
Basophils Absolute: 0.1 K/uL (ref 0.0–0.1)
Basophils Relative: 1 %
Eosinophils Absolute: 0 K/uL (ref 0.0–0.5)
Eosinophils Relative: 1 %
HCT: 43 % (ref 36.0–46.0)
Hemoglobin: 14 g/dL (ref 12.0–15.0)
Immature Granulocytes: 1 %
Lymphocytes Relative: 29 %
Lymphs Abs: 1.8 K/uL (ref 0.7–4.0)
MCH: 28.7 pg (ref 26.0–34.0)
MCHC: 32.6 g/dL (ref 30.0–36.0)
MCV: 88.1 fL (ref 80.0–100.0)
Monocytes Absolute: 0.4 K/uL (ref 0.1–1.0)
Monocytes Relative: 7 %
Neutro Abs: 4 K/uL (ref 1.7–7.7)
Neutrophils Relative %: 61 %
Platelets: 316 K/uL (ref 150–400)
RBC: 4.88 MIL/uL (ref 3.87–5.11)
RDW: 13.5 % (ref 11.5–15.5)
WBC: 6.3 K/uL (ref 4.0–10.5)
nRBC: 0 % (ref 0.0–0.2)

## 2024-07-21 LAB — HCG, SERUM, QUALITATIVE: Preg, Serum: NEGATIVE

## 2024-07-21 MED ORDER — ACETAMINOPHEN 500 MG PO TABS
1000.0000 mg | ORAL_TABLET | Freq: Once | ORAL | Status: AC
  Administered 2024-07-21: 1000 mg via ORAL
  Filled 2024-07-21: qty 2

## 2024-07-21 NOTE — ED Triage Notes (Signed)
 PT arrives via POV. Pt reports headache, dizziness, and sensitivity to light that started about 1 hour ago. PT is AxOx4.

## 2024-07-21 NOTE — ED Provider Triage Note (Signed)
 Emergency Medicine Provider Triage Evaluation Note  Ruth [name] , a 43 y.o. female  was evaluated in triage.  Pt complains of  headache that she had after putting on prescription glasses. She reports it gradually worsened after that.  Not sudden in onset.  Does feel that she is having some stars in her eyes.  Has some nausea without vomiting.  Photophobia.  Orts this feels like her previous migraines however has not had the stars in her vision before.  From reading her previous chart, does appear that patient was seen in September for similar presentation with blurry vision and headaches.  She took a BC powder prior to coming here and did not find much relief.  Review of Systems  Positive:  Negative:   Physical Exam  BP (!) 135/91   Pulse 66   Temp 98.3 F (36.8 C)   Resp 17   Ht 5' 7 (1.702 m)   Wt 97.2 kg   SpO2 100%   BMI 33.56 kg/m  Gen:   Awake, no distress   Resp:  Normal effort  MSK:   Moves extremities without difficulty  Other:  Cranial nerves grossly intact.  Strength is intact in the upper and lower bilateral extremities.  Sensation reportedly intact per patient.  Medical Decision Making  Medically screening exam initiated at 1:39 PM.  Appropriate orders placed.  Juliette Standre was informed that the remainder of the evaluation will be completed by another provider, this initial triage assessment does not replace that evaluation, and the importance of remaining in the ED until their evaluation is complete.  I have ordered her some Tylenol .  Denies any sudden onset of her headache.  Will see if she improves with migraine cocktail when given in the main ER   Bernis Ernst, PA-C 07/21/24 1344

## 2024-07-21 NOTE — ED Notes (Signed)
Called for pt x 2 in lobby, no answer 

## 2024-07-21 NOTE — ED Triage Notes (Addendum)
 Patient has had a headache in the past that felt similar to what she is feeling now, she states she usually just goes to sleep and it is the only thing that helps.  She states her eyeballs are hurting and she is dizzy. She states the only new thing with this headache is that she is seeing stars.  She states she is supposed to wear glasses and she put them on about 1-2 hours ago and she thinks the medicine in the glasses is what caused her headache.
